# Patient Record
Sex: Female | Born: 1945
Health system: Southern US, Community
[De-identification: ages and names within clinical notes are randomized; demographics above are authoritative.]

## PROBLEM LIST (undated history)

## (undated) DIAGNOSIS — M549 Dorsalgia, unspecified: Secondary | ICD-10-CM

## (undated) DIAGNOSIS — G2581 Restless legs syndrome: Secondary | ICD-10-CM

## (undated) DIAGNOSIS — T7840XA Allergy, unspecified, initial encounter: Secondary | ICD-10-CM

## (undated) DIAGNOSIS — R39198 Other difficulties with micturition: Secondary | ICD-10-CM

## (undated) DIAGNOSIS — E785 Hyperlipidemia, unspecified: Secondary | ICD-10-CM

## (undated) DIAGNOSIS — K2100 Gastro-esophageal reflux disease with esophagitis, without bleeding: Secondary | ICD-10-CM

## (undated) DIAGNOSIS — N6019 Diffuse cystic mastopathy of unspecified breast: Secondary | ICD-10-CM

## (undated) DIAGNOSIS — Z780 Asymptomatic menopausal state: Secondary | ICD-10-CM

## (undated) DIAGNOSIS — K219 Gastro-esophageal reflux disease without esophagitis: Secondary | ICD-10-CM

## (undated) DIAGNOSIS — C4491 Basal cell carcinoma of skin, unspecified: Secondary | ICD-10-CM

## (undated) HISTORY — PX: CATARACT EXTRACTION: SUR2

## (undated) HISTORY — DX: Hyperlipidemia, unspecified: E78.5

## (undated) HISTORY — PX: EYE SURGERY: SHX253

## (undated) HISTORY — DX: Allergy, unspecified, initial encounter: T78.40XA

## (undated) HISTORY — DX: Gastro-esophageal reflux disease with esophagitis, without bleeding: K21.00

## (undated) HISTORY — PX: TOTAL ABDOMINAL HYSTERECTOMY: SHX209

## (undated) HISTORY — DX: Gastro-esophageal reflux disease with esophagitis: K21.0

## (undated) HISTORY — PX: OTHER SURGICAL HISTORY: SHX169

## (undated) HISTORY — DX: Diffuse cystic mastopathy of unspecified breast: N60.19

## (undated) HISTORY — DX: Asymptomatic menopausal state: Z78.0

## (undated) HISTORY — PX: MOUTH SURGERY: SHX715

## (undated) HISTORY — PX: BREAST EXCISIONAL BIOPSY: SUR124

## (undated) HISTORY — DX: Restless legs syndrome: G25.81

## (undated) HISTORY — PX: POLYPECTOMY: SHX149

## (undated) HISTORY — DX: Basal cell carcinoma of skin, unspecified: C44.91

## (undated) HISTORY — PX: BREAST CYST ASPIRATION: SHX578

---

## 2004-04-09 ENCOUNTER — Ambulatory Visit: Payer: Self-pay | Admitting: Family Medicine

## 2004-06-22 ENCOUNTER — Ambulatory Visit: Payer: Self-pay | Admitting: Gastroenterology

## 2005-06-10 ENCOUNTER — Ambulatory Visit: Payer: Self-pay | Admitting: Family Medicine

## 2006-07-19 ENCOUNTER — Ambulatory Visit: Payer: Self-pay | Admitting: Family Medicine

## 2007-02-01 ENCOUNTER — Ambulatory Visit: Payer: Self-pay | Admitting: Physician Assistant

## 2007-02-15 ENCOUNTER — Ambulatory Visit: Payer: Self-pay | Admitting: Physician Assistant

## 2007-08-17 ENCOUNTER — Ambulatory Visit: Payer: Self-pay | Admitting: Family Medicine

## 2007-10-10 DIAGNOSIS — E559 Vitamin D deficiency, unspecified: Secondary | ICD-10-CM | POA: Insufficient documentation

## 2007-11-06 ENCOUNTER — Ambulatory Visit: Payer: Self-pay | Admitting: Unknown Physician Specialty

## 2008-08-09 ENCOUNTER — Encounter: Payer: Self-pay | Admitting: Family Medicine

## 2008-08-13 ENCOUNTER — Encounter: Payer: Self-pay | Admitting: Family Medicine

## 2008-08-23 ENCOUNTER — Encounter: Payer: Self-pay | Admitting: Cardiovascular Disease

## 2008-08-23 DIAGNOSIS — E785 Hyperlipidemia, unspecified: Secondary | ICD-10-CM | POA: Insufficient documentation

## 2008-08-28 ENCOUNTER — Ambulatory Visit: Payer: Self-pay | Admitting: Family Medicine

## 2008-09-12 ENCOUNTER — Encounter: Payer: Self-pay | Admitting: Family Medicine

## 2008-10-13 ENCOUNTER — Encounter: Payer: Self-pay | Admitting: Family Medicine

## 2008-11-13 ENCOUNTER — Encounter: Payer: Self-pay | Admitting: Family Medicine

## 2009-01-17 DIAGNOSIS — D239 Other benign neoplasm of skin, unspecified: Secondary | ICD-10-CM

## 2009-01-17 HISTORY — DX: Other benign neoplasm of skin, unspecified: D23.9

## 2009-08-18 ENCOUNTER — Encounter: Payer: Self-pay | Admitting: Cardiovascular Disease

## 2009-08-18 LAB — CONVERTED CEMR LAB
ALT: 11 units/L
Albumin: 4.4 g/dL
Alkaline Phosphatase: 59 units/L
CO2: 27 meq/L
Chloride: 101 meq/L
Creatinine, Ser: 0.92 mg/dL
HDL: 90 mg/dL
TSH: 5.42 microintl units/mL
Total Bilirubin: 0.4 mg/dL
Triglyceride fasting, serum: 110 mg/dL

## 2009-08-27 ENCOUNTER — Encounter: Payer: Self-pay | Admitting: Cardiovascular Disease

## 2009-08-29 ENCOUNTER — Encounter: Payer: Self-pay | Admitting: Cardiovascular Disease

## 2009-09-03 ENCOUNTER — Ambulatory Visit: Payer: Self-pay | Admitting: Cardiovascular Disease

## 2009-09-03 DIAGNOSIS — R9431 Abnormal electrocardiogram [ECG] [EKG]: Secondary | ICD-10-CM | POA: Insufficient documentation

## 2009-09-19 ENCOUNTER — Ambulatory Visit: Payer: Self-pay | Admitting: Family Medicine

## 2010-04-14 NOTE — Letter (Signed)
Summary: Historic Patient File  Historic Patient File   Imported By: [place] Carbo 09/04/2009 11:18:08  _____________________________________________________________________  External Attachment:    Type:   Image     Comment:   External Document

## 2010-04-14 NOTE — Assessment & Plan Note (Signed)
Summary: NP6/AMD   Visit Type:  Initial Consult Primary Provider:  [facility]  CC:  abnormal EKG. No cardiac complaints.  History of Present Illness: Ms. Heather Singh is a very pleasant 65 year old woman with no significant past cardiac history, history of GERD, osteoporosis who is active at baseline who presents for an abnormal EKG. She does have hyperlipidemia.  She states that she is active, works in her garden, exercises with a TV exercise program 3 times per week for more than 20 minutes at a time and does not have any symptoms of shortness of breath or chest pain. No significant leg edema, no lightheadedness, dizziness, chest tightness or neck tightness.  EKG from Dr. Carlynn Purl office on August 28, 1998 and shows normal sinus rhythm with poor R-wave progression through the precordial leads otherwise no significant ST or T wave changes.  EKG from June 2010 is unchanged from June 2011.  EKG in our office shows normal sinus rhythm with heart rate 59 beats per minute, no significant ST or T wave changes. The R wave progression is normal, T wave inversion has resolved in V2.  Current Medications (verified): 1)  Aspirin 81 Mg Tbec (Aspirin) .... Take One Tablet By Mouth Daily 2)  [facility] Folic Acid 800 Mcg Tabs (Folic Acid) .... Once Daily 3)  Synthroid 25 Mcg Tabs (Levothyroxine Sodium) .... 1/2 Once Daily 4)  Premarin 0.45 Mg Tabs (Estrogens Conjugated) .... Once Daily 5)  Pantoprazole Sodium 40 Mg Tbec (Pantoprazole Sodium) .... Once Daily  Allergies (verified): 1)  ! * Azithromycin 2)  ! Levaquin 3)  ! Macrobid 4)  ! Macrodantin 5)  ! Pcn 6)  ! Septra 7)  ! Septra  Past History:  Past Medical History: Last updated: 08/29/2009 reflux esophagitis polyectomy  menopause  RLS fibrocystic Breast disease osteoporosis dyslipidemia  Past Surgical History: Last updated: 08/29/2009 sling blade procedure left eye oral surgery Abdominal Hysterectomy-Total Cataract Extraction  Family  History: Last updated: 08/29/2009 CHF Family History of Coronary Artery Disease:  Family History of CVA or Stroke:  Family History of Hyperlipidemia:  Family History of Thyroid Disease:  Family History of Hypertension:   Social History: Last updated: 08/29/2009 Married  Tobacco Use - No.  Alcohol Use - no Regular Exercise - yes Drug Use - no  Risk Factors: Exercise: yes (08/29/2009)  Risk Factors: Smoking Status: never (08/29/2009)  Review of Systems  The patient denies fever, weight loss, weight gain, vision loss, decreased hearing, hoarseness, chest pain, syncope, dyspnea on exertion, peripheral edema, prolonged cough, abdominal pain, incontinence, muscle weakness, depression, and enlarged lymph nodes.    Vital Signs:  Patient profile:   65 year old female Height:      62 inches Weight:      106 pounds BMI:     19.46 Pulse rate:   59 / minute BP sitting:   120 / 80  (left arm) Cuff size:   regular  Vitals Entered By: Bishop Dublin, CMA (September 03, 2009 10:40 AM)  Physical Exam  General:  Well developed, well nourished, in no acute distress. Head:  normocephalic and atraumatic Neck:  Neck supple, no JVD. No masses, thyromegaly or abnormal cervical nodes. Lungs:  Clear bilaterally to auscultation and percussion. Heart:  Non-displaced PMI, chest non-tender; regular rate and rhythm, S1, S2 without murmurs, rubs or gallops. Carotid upstroke normal, no bruit.  Pedals normal pulses. No edema, no varicosities. Abdomen:  Bowel sounds positive; abdomen soft and non-tender without masses Msk:  Back normal, normal gait. Muscle  strength and tone normal. Pulses:  pulses normal in all 4 extremities Extremities:  No clubbing or cyanosis. Neurologic:  Alert and oriented x 3. Skin:  Intact without lesions or rashes. Psych:  Normal affect.   Impression & Recommendations:  Problem # 1:  ABNORMAL EKG (ICD-794.31) borderline changes noticed on her EKG from Dr. Carlynn Purl office with  poor progression through the anterior precordial leads, borderline T wave abnormality in V2.  These changes have resolved on her EKG here in our office and I wonder if this may be secondary to lead placement.  She has no symptoms concerning for angina. No shortness of breath with exertion. I suggest we follow her for now and if she stays asymptomatic, continues to do her exercises, no additional workup may be needed.  Her updated medication list for this problem includes:    Aspirin 81 Mg Tbec (Aspirin) .Marland Kitchen... Take one tablet by mouth daily  Problem # 2:  HYPERLIPIDEMIA-MIXED (ICD-272.4) We did talk to her about her high cholesterol. She reports her total cholesterol was  240. We'll try to obtain these records. her HDL was reportedly very elevated. She does have a strong family history as her father had a bypass surgery at age 67 and he smoked only in his early years. I will evaluate her carotid ultrasound and see if she has the start of some plaquing though she reports the study looked good. I did mention that if she does have plaquing, we would probably recommend starting low-dose generic simvastatin or Lipitor when it comes generic. Ideally her cholesterol should be less than 200.

## 2010-09-21 ENCOUNTER — Encounter: Payer: Self-pay | Admitting: Cardiovascular Disease

## 2010-10-02 ENCOUNTER — Ambulatory Visit: Payer: Self-pay | Admitting: Family Medicine

## 2011-10-08 ENCOUNTER — Ambulatory Visit: Payer: Self-pay | Admitting: Family Medicine

## 2012-02-28 ENCOUNTER — Ambulatory Visit: Payer: Self-pay

## 2012-11-01 ENCOUNTER — Ambulatory Visit: Payer: Self-pay | Admitting: Family Medicine

## 2013-03-15 LAB — HM COLONOSCOPY: HM Colonoscopy: NORMAL

## 2013-12-19 ENCOUNTER — Ambulatory Visit: Payer: Self-pay | Admitting: Family Medicine

## 2013-12-29 LAB — HM MAMMOGRAPHY: HM MAMMO: NORMAL

## 2014-03-21 ENCOUNTER — Ambulatory Visit: Payer: Self-pay | Admitting: Family Medicine

## 2014-03-21 LAB — HM DEXA SCAN

## 2014-04-23 LAB — LIPID PANEL
CHOLESTEROL: 185 mg/dL (ref 0–200)
HDL: 84 mg/dL — AB (ref 35–70)
LDL CALC: 90 mg/dL
TRIGLYCERIDES: 56 mg/dL (ref 40–160)

## 2014-10-19 ENCOUNTER — Encounter: Payer: Self-pay | Admitting: Family Medicine

## 2014-10-19 DIAGNOSIS — K219 Gastro-esophageal reflux disease without esophagitis: Secondary | ICD-10-CM | POA: Insufficient documentation

## 2014-10-19 DIAGNOSIS — N6019 Diffuse cystic mastopathy of unspecified breast: Secondary | ICD-10-CM | POA: Insufficient documentation

## 2014-10-19 DIAGNOSIS — M810 Age-related osteoporosis without current pathological fracture: Secondary | ICD-10-CM | POA: Insufficient documentation

## 2014-10-19 DIAGNOSIS — G2581 Restless legs syndrome: Secondary | ICD-10-CM | POA: Insufficient documentation

## 2014-10-19 DIAGNOSIS — E538 Deficiency of other specified B group vitamins: Secondary | ICD-10-CM | POA: Insufficient documentation

## 2014-10-19 DIAGNOSIS — E039 Hypothyroidism, unspecified: Secondary | ICD-10-CM | POA: Insufficient documentation

## 2014-10-19 DIAGNOSIS — K449 Diaphragmatic hernia without obstruction or gangrene: Secondary | ICD-10-CM | POA: Insufficient documentation

## 2014-10-19 DIAGNOSIS — J3089 Other allergic rhinitis: Secondary | ICD-10-CM | POA: Insufficient documentation

## 2014-10-19 DIAGNOSIS — E2839 Other primary ovarian failure: Secondary | ICD-10-CM | POA: Insufficient documentation

## 2014-10-21 ENCOUNTER — Encounter: Payer: Self-pay | Admitting: Family Medicine

## 2014-10-21 ENCOUNTER — Telehealth: Payer: Self-pay | Admitting: Family Medicine

## 2014-10-21 ENCOUNTER — Ambulatory Visit (INDEPENDENT_AMBULATORY_CARE_PROVIDER_SITE_OTHER): Payer: Federal, State, Local not specified - PPO | Admitting: Family Medicine

## 2014-10-21 VITALS — BP 108/62 | HR 78 | Temp 97.5°F | Resp 16 | Ht 63.0 in | Wt 111.4 lb

## 2014-10-21 DIAGNOSIS — J3089 Other allergic rhinitis: Secondary | ICD-10-CM

## 2014-10-21 DIAGNOSIS — E039 Hypothyroidism, unspecified: Secondary | ICD-10-CM

## 2014-10-21 DIAGNOSIS — K5909 Other constipation: Secondary | ICD-10-CM | POA: Diagnosis not present

## 2014-10-21 DIAGNOSIS — J309 Allergic rhinitis, unspecified: Secondary | ICD-10-CM

## 2014-10-21 DIAGNOSIS — Z23 Encounter for immunization: Secondary | ICD-10-CM

## 2014-10-21 DIAGNOSIS — E785 Hyperlipidemia, unspecified: Secondary | ICD-10-CM | POA: Diagnosis not present

## 2014-10-21 DIAGNOSIS — M810 Age-related osteoporosis without current pathological fracture: Secondary | ICD-10-CM | POA: Diagnosis not present

## 2014-10-21 DIAGNOSIS — R600 Localized edema: Secondary | ICD-10-CM | POA: Diagnosis not present

## 2014-10-21 MED ORDER — LEVOTHYROXINE SODIUM 25 MCG PO TABS: 25.0000 ug | ORAL_TABLET | Freq: Every day | ORAL | Status: DC

## 2014-10-21 MED ORDER — PRAVASTATIN SODIUM 20 MG PO TABS: 20.0000 mg | ORAL_TABLET | Freq: Every day | ORAL | Status: DC

## 2014-10-21 MED ORDER — FLUTICASONE PROPIONATE 50 MCG/ACT NA SUSP: 2.0000 | Freq: Every day | NASAL | Status: DC

## 2014-10-21 NOTE — Progress Notes (Signed)
Name: Heather Singh   MRN: [id number]    DOB: 05/31/1945   Date:10/21/2014       Progress Note  Subjective  Chief Complaint  Chief Complaint  Patient presents with  . Hypothyroidism  . Hyperlipidemia  . Fatigue    onset 2 weeks ago, states she has had a couple of episodes where she felt really weak no energy and took bp and it was low 90's/50's (improving)    HPI  Hypothyroidism: she has been doing well, had an episode of fatigue for two days about two weeks ago and bp was low, also has noticed some constipation since she started taking calcium supplementation daily . No dysphagia, or hair loss  Constipation: she has noticed hard stools since she started taking Calcium daily a few months ago.   Bilateral lower extremity edema: She had an episode of leg swelling associated with low bp and fatigue a couple weeks ago but has resolved, she denies associated SOB, cough or chest pain at the time.   Perennial Allergic Rhinitis: using nasal spray and taking loratadine and symptoms are under control at this time.  Patient Active Problem List   Diagnosis Date Noted  . Other constipation 10/21/2014  . Acquired hypothyroidism 10/19/2014  . Perennial allergic rhinitis 10/19/2014  . B12 deficiency 10/19/2014  . Fibrocystic breast disease 10/19/2014  . Gastro-esophageal reflux disease without esophagitis 10/19/2014  . OP (osteoporosis) 10/19/2014  . Ovarian failure 10/19/2014  . Hiatal hernia 10/19/2014  . Restless legs syndrome 10/19/2014  . ABNORMAL EKG 09/03/2009  . Dyslipidemia 08/23/2008  . Vitamin D deficiency 10/10/2007    Past Surgical History  Procedure Laterality Date  . Sling blade procedure    . Polypectomy    . Eye surgery      left  . Mouth surgery    . Total abdominal hysterectomy    . Cataract extraction      Family History  Problem Relation Age of Onset  . Heart failure Other   . Coronary artery disease Other   . Stroke Other   . Hyperlipidemia Other   .  Hypertension Other   . Thyroid disease Other   . Transient ischemic attack Mother   . Atrial fibrillation Mother   . Arthritis Mother     OA  . Heart disease Father     CHF  . CAD Father   . Hyperlipidemia Sister   . Arthritis Sister     OA  . Drug abuse Son   . Hypertension Son   . Stroke Sister   . Arthritis Sister     OA    History   Social History  . Marital Status: Married    Spouse Name: N/A  . Number of Children: N/A  . Years of Education: N/A   Occupational History  . Not on file.   Social History Main Topics  . Smoking status: Never Smoker   . Smokeless tobacco: Never Used  . Alcohol Use: No  . Drug Use: No  . Sexual Activity: Yes   Other Topics Concern  . Not on file   Social History Narrative   Married   Gets regular exercise     Current outpatient prescriptions:  .  aspirin 81 MG tablet, Take 81 mg by mouth daily.  , Disp: , Rfl:  .  Calcium Citrate-Vitamin D (CALCIUM CITRATE + D) 250-200 MG-UNIT TABS, Take 1 tablet by mouth daily., Disp: , Rfl:  .  fluticasone (FLONASE) 50 MCG/ACT nasal  spray, Place 2 sprays into both nostrils daily., Disp: 16 g, Rfl: 5 .  levothyroxine (SYNTHROID, LEVOTHROID) 25 MCG tablet, Take 1 tablet (25 mcg total) by mouth daily., Disp: 30 tablet, Rfl: 5 .  loratadine (CLARITIN) 10 MG tablet, Take 1 tablet by mouth daily., Disp: , Rfl:  .  pravastatin (PRAVACHOL) 20 MG tablet, Take 1 tablet (20 mg total) by mouth daily., Disp: 30 tablet, Rfl: 5 .  vitamin B-12 (CYANOCOBALAMIN) 1000 MCG tablet, Take 1,000 mcg by mouth daily., Disp: , Rfl:  .  vitamin E 1000 UNIT capsule, Take 1,000 Units by mouth daily., Disp: , Rfl:   Allergies  Allergen Reactions  . Alendronate     dypspepsia  . Azithromycin   . Levofloxacin   . Nitrofurantoin   . Nitrofurantoin Monohyd Macro   . Penicillins   . Sulfamethoxazole-Trimethoprim   . Etodolac Rash     ROS  Constitutional: Negative for fever or weight change.  Respiratory:  Negative for cough and shortness of breath.   Cardiovascular: Negative for chest pain or palpitations.  Gastrointestinal: Negative for abdominal pain, positive  bowel changes.  Musculoskeletal: Negative for gait problem or joint swelling.  Skin: Negative for rash.  Neurological: Negative for dizziness or headache.  No other specific complaints in a complete review of systems (except as listed in HPI above).  Objective  Filed Vitals:   10/21/14 0934  BP: 108/62  Pulse: 78  Temp: 97.5 F (36.4 C)  TempSrc: Oral  Resp: 16  Height: 5\' 3"  (1.6 m)  Weight: 111 lb 6.4 oz (50.531 kg)  SpO2: 98%    Body mass index is 19.74 kg/(m^2).  Physical Exam  Constitutional: Patient appears well-developed and well-nourished.  No distress.  HEENT: head atraumatic, normocephalic, s/p cataract surgery ,  neck supple, no thyromegaly, throat within normal limits Cardiovascular: Normal rate, regular rhythm and normal heart sounds.  No murmur heard. No BLE edema. Pulmonary/Chest: Effort normal and breath sounds normal. No respiratory distress. Abdominal: Soft.  There is no tenderness. Psychiatric: Patient has a normal mood and affect. behavior is normal. Judgment and thought content normal.   PHQ2/9: Depression screen PHQ 2/9 10/21/2014  Decreased Interest 0  Down, Depressed, Hopeless 0  PHQ - 2 Score 0     Fall Risk: Fall Risk  10/21/2014  Falls in the past year? No      Assessment & Plan  1. Bilateral edema of lower extremity Resolved, associated with hypotension, advised to take Gatorade prn if needed  2. OP (osteoporosis) We will contact Dr. Jefm Bryant, patient states they were supposed to get Prolia approved, but she never a call back  3. Dyslipidemia  - pravastatin (PRAVACHOL) 20 MG tablet; Take 1 tablet (20 mg total) by mouth daily.  Dispense: 30 tablet; Refill: 5  4. Acquired hypothyroidism  - levothyroxine (SYNTHROID, LEVOTHROID) 25 MCG tablet; Take 1 tablet (25 mcg total) by  mouth daily.  Dispense: 30 tablet; Refill: 5  5. Perennial allergic rhinitis  - fluticasone (FLONASE) 50 MCG/ACT nasal spray; Place 2 sprays into both nostrils daily.  Dispense: 16 g; Refill: 5  6. Other constipation  Advised prune juice, increase water intake   7. Need for pneumococcal vaccination  - Pneumococcal conjugate vaccine 13-valent

## 2014-10-21 NOTE — Telephone Encounter (Signed)
She was seen today and told me Dr. Jefm Bryant was supposed to contact her about when she was going to start Prolia but she has been unable to get a hold of him since March .Please find out if medication was approved and when she can start osteoporosis therapy Thank you

## 2014-10-21 NOTE — Telephone Encounter (Signed)
When I called they stated.  Patient was suppost to contact them back about which medication she wanted to start prolia or reclast?  I told them to go ahead and run for prolia

## 2014-11-13 ENCOUNTER — Encounter: Payer: Self-pay | Admitting: Family Medicine

## 2014-11-13 ENCOUNTER — Ambulatory Visit (INDEPENDENT_AMBULATORY_CARE_PROVIDER_SITE_OTHER): Payer: Federal, State, Local not specified - PPO | Admitting: Family Medicine

## 2014-11-13 VITALS — BP 124/64 | HR 78 | Temp 98.0°F | Resp 16 | Ht 62.0 in | Wt 113.0 lb

## 2014-11-13 DIAGNOSIS — Z23 Encounter for immunization: Secondary | ICD-10-CM | POA: Diagnosis not present

## 2014-11-13 DIAGNOSIS — K219 Gastro-esophageal reflux disease without esophagitis: Secondary | ICD-10-CM

## 2014-11-13 DIAGNOSIS — K5909 Other constipation: Secondary | ICD-10-CM | POA: Diagnosis not present

## 2014-11-13 MED ORDER — RANITIDINE HCL 150 MG PO CAPS: 150.0000 mg | ORAL_CAPSULE | Freq: Two times a day (BID) | ORAL | Status: DC

## 2014-11-13 MED ORDER — POLYETHYLENE GLYCOL 3350 17 GM/SCOOP PO POWD: 17.0000 g | Freq: Every day | ORAL | Status: DC

## 2014-11-13 NOTE — Progress Notes (Signed)
Name: Heather Singh   MRN: [id number]    DOB: 1945/11/23   Date:11/13/2014       Progress Note  Subjective  Chief Complaint  Chief Complaint  Patient presents with  . Gastrophageal Reflux    Constant-Onset-1 week, worsen, burning, acid coming into mouth. Patient states she was on medication before and went off and was doing good for a while and now it has came back.   . Constipation    Constant- Onset-1 month, unchanged, patient is going every 2-3 days with not much bowel movement-hard balls, painful. Patient has not tried anything otc but was told to come back in and discuss with Dr. Ancil Boozer if problem was consistant     HPI  Constipation: new onset - started on Calcium daily in March 2016 and constipation has gotten progressively worse since. Severe over the past month.  She has a Bristol score of 1 -2 . Bowel movements about every three days now. She is up to date with colonoscopy. Done 03/2013 and had two polyps - repeat in 5 years.  Mild decrease in appetite over the past couple of weeks. Feeling bloated, no blood in stools. She has to strain a little. Also has noticed increase in urinary frequency. No dysuria.   GERD: also has a history of mild hiatal hernia. She was doing well, off medication for a long time but over the past week she has noticed regurgitation, substernal burning that is constant. She had EGD done by Dr. Tiffany Kocher in 2009.  Patient Active Problem List   Diagnosis Date Noted  . Other constipation 10/21/2014  . Acquired hypothyroidism 10/19/2014  . Perennial allergic rhinitis 10/19/2014  . B12 deficiency 10/19/2014  . Fibrocystic breast disease 10/19/2014  . Gastro-esophageal reflux disease without esophagitis 10/19/2014  . OP (osteoporosis) 10/19/2014  . Ovarian failure 10/19/2014  . Hiatal hernia 10/19/2014  . Restless legs syndrome 10/19/2014  . ABNORMAL EKG 09/03/2009  . Dyslipidemia 08/23/2008  . Vitamin D deficiency 10/10/2007    Past Surgical History   Procedure Laterality Date  . Sling blade procedure    . Polypectomy    . Eye surgery      left  . Mouth surgery    . Total abdominal hysterectomy    . Cataract extraction      Family History  Problem Relation Age of Onset  . Heart failure Other   . Coronary artery disease Other   . Stroke Other   . Hyperlipidemia Other   . Hypertension Other   . Thyroid disease Other   . Transient ischemic attack Mother   . Atrial fibrillation Mother   . Arthritis Mother     OA  . Heart disease Father     CHF  . CAD Father   . Hyperlipidemia Sister   . Arthritis Sister     OA  . Drug abuse Son   . Hypertension Son   . Stroke Sister   . Arthritis Sister     OA    Social History   Social History  . Marital Status: Married    Spouse Name: N/A  . Number of Children: N/A  . Years of Education: N/A   Occupational History  . Not on file.   Social History Main Topics  . Smoking status: Never Smoker   . Smokeless tobacco: Never Used  . Alcohol Use: No  . Drug Use: No  . Sexual Activity:    Partners: Male   Other Topics Concern  .  Not on file   Social History Narrative   Married   Gets regular exercise     Current outpatient prescriptions:  .  aspirin 81 MG tablet, Take 81 mg by mouth daily.  , Disp: , Rfl:  .  Calcium Citrate-Vitamin D (CALCIUM CITRATE + D) 250-200 MG-UNIT TABS, Take 1 tablet by mouth daily., Disp: , Rfl:  .  fluticasone (FLONASE) 50 MCG/ACT nasal spray, Place 2 sprays into both nostrils daily., Disp: 16 g, Rfl: 5 .  levothyroxine (SYNTHROID, LEVOTHROID) 25 MCG tablet, Take 1 tablet (25 mcg total) by mouth daily., Disp: 30 tablet, Rfl: 5 .  loratadine (CLARITIN) 10 MG tablet, Take 1 tablet by mouth daily., Disp: , Rfl:  .  pravastatin (PRAVACHOL) 20 MG tablet, Take 1 tablet (20 mg total) by mouth daily., Disp: 30 tablet, Rfl: 5 .  vitamin B-12 (CYANOCOBALAMIN) 1000 MCG tablet, Take 1,000 mcg by mouth daily., Disp: , Rfl:  .  vitamin E 1000 UNIT capsule,  Take 1,000 Units by mouth daily., Disp: , Rfl:   Allergies  Allergen Reactions  . Alendronate     dypspepsia  . Azithromycin   . Levofloxacin   . Nitrofurantoin   . Nitrofurantoin Monohyd Macro   . Penicillins   . Sulfamethoxazole-Trimethoprim   . Etodolac Rash     ROS  Constitutional: Negative for fever or significant weight change.  Respiratory: Mild   cough no  shortness of breath.   Cardiovascular: Negative for chest pain or palpitations.  Gastrointestinal: Negative for abdominal pain, positive for  bowel changes.  Musculoskeletal: Negative for gait problem or joint swelling.  Skin: Negative for rash.  Neurological: Negative for dizziness or headache.  No other specific complaints in a complete review of systems (except as listed in HPI above).  Objective  Filed Vitals:   11/13/14 1014  BP: 124/64  Pulse: 78  Temp: 98 F (36.7 C)  TempSrc: Oral  Resp: 16  Height: 5\' 2"  (1.575 m)  Weight: 113 lb (51.256 kg)  SpO2: 98%    Body mass index is 20.66 kg/(m^2).  Physical Exam  Constitutional: Patient appears well-developed and well-nourished.  No distress.  HEENT: head atraumatic, normocephalic, pupils equal and reactive to light,  neck supple, throat within normal limits Cardiovascular: Normal rate, regular rhythm and normal heart sounds.  No murmur heard. No BLE edema. Pulmonary/Chest: Effort normal and breath sounds normal. No respiratory distress. Abdominal: Soft.  There is no tenderness. No masses, rectal exam, some stools present, but not impacted, no hemorrhoids, hemo- occult negative Psychiatric: Patient has a normal mood and affect. behavior is normal. Judgment and thought content normal.   PHQ2/9: Depression screen PHQ 2/9 10/21/2014  Decreased Interest 0  Down, Depressed, Hopeless 0  PHQ - 2 Score 0     Fall Risk: Fall Risk  10/21/2014  Falls in the past year? No      Assessment & Plan  1. Other constipation New onset, over the past 6  months, up to date with colonoscopy, heme stool negative in the office today , we will try Miralax for two weeks and switch to Linzess if no improvement , may need to hold calcium tablets also until symptoms improves  - TSH - Comprehensive metabolic panel - CBC with Differential/Platelet - polyethylene glycol powder (GLYCOLAX/MIRALAX) powder; Take 17 g by mouth daily.  Dispense: 3350 g; Refill: 0  2. Needs flu shot  - Flu vaccine HIGH DOSE PF (Fluzone High dose)  3. Gastro-esophageal reflux disease without  esophagitis It may have been triggered by constipation, we will try Ranitidine first - ranitidine (ZANTAC) 150 MG capsule; Take 1 capsule (150 mg total) by mouth 2 (two) times daily.  Dispense: 60 capsule; Refill: 0

## 2014-11-13 NOTE — Patient Instructions (Signed)
Start with Miralax and if no improvement after two weeks switch to Linzess

## 2014-11-15 ENCOUNTER — Other Ambulatory Visit: Payer: Self-pay | Admitting: Family Medicine

## 2014-11-15 LAB — TSH: TSH: 2.88 u[IU]/mL (ref 0.450–4.500)

## 2014-11-15 LAB — CBC WITH DIFFERENTIAL/PLATELET
BASOS ABS: 0 10*3/uL (ref 0.0–0.2)
BASOS: 0 %
EOS (ABSOLUTE): 0.1 10*3/uL (ref 0.0–0.4)
Eos: 2 %
Hematocrit: 36.3 % (ref 34.0–46.6)
Hemoglobin: 12.2 g/dL (ref 11.1–15.9)
IMMATURE GRANULOCYTES: 0 %
Immature Grans (Abs): 0 10*3/uL (ref 0.0–0.1)
Lymphocytes Absolute: 1 10*3/uL (ref 0.7–3.1)
Lymphs: 22 %
MCH: 33 pg (ref 26.6–33.0)
MCHC: 33.6 g/dL (ref 31.5–35.7)
MCV: 98 fL — AB (ref 79–97)
MONOS ABS: 0.5 10*3/uL (ref 0.1–0.9)
Monocytes: 11 %
NEUTROS PCT: 65 %
Neutrophils Absolute: 3 10*3/uL (ref 1.4–7.0)
PLATELETS: 214 10*3/uL (ref 150–379)
RBC: 3.7 x10E6/uL — ABNORMAL LOW (ref 3.77–5.28)
RDW: 13.4 % (ref 12.3–15.4)
WBC: 4.7 10*3/uL (ref 3.4–10.8)

## 2014-11-15 LAB — COMPREHENSIVE METABOLIC PANEL
A/G RATIO: 2.2 (ref 1.1–2.5)
ALK PHOS: 67 IU/L (ref 39–117)
ALT: 9 IU/L (ref 0–32)
AST: 16 IU/L (ref 0–40)
Albumin: 4.4 g/dL (ref 3.5–4.8)
BILIRUBIN TOTAL: 0.5 mg/dL (ref 0.0–1.2)
BUN/Creatinine Ratio: 17 (ref 11–26)
BUN: 12 mg/dL (ref 8–27)
CO2: 26 mmol/L (ref 18–29)
Calcium: 9.6 mg/dL (ref 8.7–10.3)
Chloride: 99 mmol/L (ref 97–108)
Creatinine, Ser: 0.72 mg/dL (ref 0.57–1.00)
GFR calc Af Amer: 97 mL/min/{1.73_m2} (ref >59)
GFR calc non Af Amer: 85 mL/min/{1.73_m2} (ref >59)
GLOBULIN, TOTAL: 2 g/dL (ref 1.5–4.5)
Glucose: 83 mg/dL (ref 65–99)
POTASSIUM: 4.3 mmol/L (ref 3.5–5.2)
Sodium: 140 mmol/L (ref 134–144)
Total Protein: 6.4 g/dL (ref 6.0–8.5)

## 2014-12-13 ENCOUNTER — Ambulatory Visit: Payer: Federal, State, Local not specified - PPO | Admitting: Family Medicine

## 2015-04-24 ENCOUNTER — Ambulatory Visit (INDEPENDENT_AMBULATORY_CARE_PROVIDER_SITE_OTHER): Payer: Federal, State, Local not specified - PPO | Admitting: Family Medicine

## 2015-04-24 ENCOUNTER — Encounter: Payer: Self-pay | Admitting: Family Medicine

## 2015-04-24 VITALS — BP 120/62 | HR 73 | Temp 97.8°F | Resp 14 | Ht 62.0 in | Wt 112.1 lb

## 2015-04-24 DIAGNOSIS — M810 Age-related osteoporosis without current pathological fracture: Secondary | ICD-10-CM

## 2015-04-24 DIAGNOSIS — E559 Vitamin D deficiency, unspecified: Secondary | ICD-10-CM

## 2015-04-24 DIAGNOSIS — E785 Hyperlipidemia, unspecified: Secondary | ICD-10-CM

## 2015-04-24 DIAGNOSIS — E039 Hypothyroidism, unspecified: Secondary | ICD-10-CM | POA: Diagnosis not present

## 2015-04-24 DIAGNOSIS — E538 Deficiency of other specified B group vitamins: Secondary | ICD-10-CM | POA: Diagnosis not present

## 2015-04-24 DIAGNOSIS — K5909 Other constipation: Secondary | ICD-10-CM | POA: Diagnosis not present

## 2015-04-24 DIAGNOSIS — R5383 Other fatigue: Secondary | ICD-10-CM | POA: Diagnosis not present

## 2015-04-24 MED ORDER — LEVOTHYROXINE SODIUM 25 MCG PO TABS: 25.0000 ug | ORAL_TABLET | Freq: Every day | ORAL | Status: DC

## 2015-04-24 MED ORDER — PRAVASTATIN SODIUM 20 MG PO TABS: 20.0000 mg | ORAL_TABLET | Freq: Every day | ORAL | Status: DC

## 2015-04-24 NOTE — Progress Notes (Signed)
Name: Heather Singh   MRN: [id number]    DOB: 25-May-1945   Date:04/24/2015       Progress Note  Subjective  Chief Complaint  Chief Complaint  Patient presents with  . Follow-up    patient is here for her 45-month f/u    HPI  Hypothyroidism: she has been very tired lately, taking same dose Synthroid for months. Skin is a little more dry than usual but is expected during the winter time. She denies dysphagia.   Constipation: she had severe constipation with calcium supplementation, but resolved since she stopped supplementation and took Miralax, bowel movements are back to normal now.   Hyperlipidemia: she is taking Pravachol denies side effects, she would like to have labs rechecked. No chest pain or SOB, swelling of legs has improved.   Perennial Allergic Rhinitis: using nasal spray and taking loratadine and symptoms are under control at this time.  Osteoporosis: seen by Dr. Jefm Bryant but Prolia not covered by insurance, taking Vitamin D 2000 units daily and will contact him back.   Right ear pain: she has noticed pain on right side when she is running errands on windy cold days. Advised to wear ear plugs or cotton balls on those days.   Stress: she worries about her sister that had a stroke 3 years ago and is at [facility] since the event.   Patient Active Problem List   Diagnosis Date Noted  . Acquired hypothyroidism 10/19/2014  . Perennial allergic rhinitis 10/19/2014  . B12 deficiency 10/19/2014  . Fibrocystic breast disease 10/19/2014  . Gastro-esophageal reflux disease without esophagitis 10/19/2014  . OP (osteoporosis) 10/19/2014  . Ovarian failure 10/19/2014  . Hiatal hernia 10/19/2014  . Restless legs syndrome 10/19/2014  . ABNORMAL EKG 09/03/2009  . Dyslipidemia 08/23/2008  . Vitamin D deficiency 10/10/2007    Past Surgical History  Procedure Laterality Date  . Sling blade procedure    . Polypectomy    . Eye surgery      left  . Mouth surgery    . Total  abdominal hysterectomy    . Cataract extraction      Family History  Problem Relation Age of Onset  . Heart failure Other   . Coronary artery disease Other   . Stroke Other   . Hyperlipidemia Other   . Hypertension Other   . Thyroid disease Other   . Transient ischemic attack Mother   . Atrial fibrillation Mother   . Arthritis Mother     OA  . Heart disease Father     CHF  . CAD Father   . Hyperlipidemia Sister   . Arthritis Sister     OA  . Drug abuse Son   . Hypertension Son   . Stroke Sister   . Arthritis Sister     OA    Social History   Social History  . Marital Status: Married    Spouse Name: N/A  . Number of Children: N/A  . Years of Education: N/A   Occupational History  . Not on file.   Social History Main Topics  . Smoking status: Never Smoker   . Smokeless tobacco: Never Used  . Alcohol Use: No  . Drug Use: No  . Sexual Activity:    Partners: Male   Other Topics Concern  . Not on file   Social History Narrative   Married   Gets regular exercise     Current outpatient prescriptions:  .  aspirin 81 MG  tablet, Take 81 mg by mouth daily.  , Disp: , Rfl:  .  fluticasone (FLONASE) 50 MCG/ACT nasal spray, Place 2 sprays into both nostrils daily., Disp: 16 g, Rfl: 5 .  levothyroxine (SYNTHROID, LEVOTHROID) 25 MCG tablet, Take 1 tablet (25 mcg total) by mouth daily., Disp: 30 tablet, Rfl: 5 .  loratadine (CLARITIN) 10 MG tablet, Take 1 tablet by mouth daily., Disp: , Rfl:  .  pravastatin (PRAVACHOL) 20 MG tablet, Take 1 tablet (20 mg total) by mouth daily., Disp: 30 tablet, Rfl: 5 .  vitamin B-12 (CYANOCOBALAMIN) 1000 MCG tablet, Take 1,000 mcg by mouth daily., Disp: , Rfl:  .  vitamin E 1000 UNIT capsule, Take 1,000 Units by mouth daily., Disp: , Rfl:   Allergies  Allergen Reactions  . Alendronate     dypspepsia  . Azithromycin   . Levofloxacin   . Nitrofurantoin   . Nitrofurantoin Monohyd Macro   . Penicillins   .  Sulfamethoxazole-Trimethoprim   . Etodolac Rash     ROS  Ten systems reviewed and is negative except as mentioned in HPI   Objective  Filed Vitals:   04/24/15 0905  BP: 120/62  Pulse: 73  Temp: 97.8 F (36.6 C)  TempSrc: Oral  Resp: 14  Height: 5\' 2"  (1.575 m)  Weight: 112 lb 1.6 oz (50.848 kg)  SpO2: 97%    Body mass index is 20.5 kg/(m^2).  Physical Exam  Constitutional: Patient appears well-developed and well-nourished.  No distress.  HEENT: head atraumatic, normocephalic, pupils equal and reactive to light, ears normal TM bilaterally, neck supple, throat within normal limits, normal TMJ bilaterally  Cardiovascular: Normal rate, regular rhythm and normal heart sounds.  No murmur heard. No BLE edema. Pulmonary/Chest: Effort normal and breath sounds normal. No respiratory distress. Abdominal: Soft.  There is no tenderness. Psychiatric: Patient has a normal mood and affect. behavior is normal. Judgment and thought content normal.  PHQ2/9: Depression screen [facility] 2/9 04/24/2015 10/21/2014  Decreased Interest 0 0  Down, Depressed, Hopeless 0 0  PHQ - 2 Score 0 0    Fall Risk: Fall Risk  04/24/2015 10/21/2014  Falls in the past year? No No    Functional Status Survey: Is the patient deaf or have difficulty hearing?: No Does the patient have difficulty seeing, even when wearing glasses/contacts?: No Does the patient have difficulty concentrating, remembering, or making decisions?: No Does the patient have difficulty walking or climbing stairs?: No Does the patient have difficulty dressing or bathing?: No Does the patient have difficulty doing errands alone such as visiting a doctor's office or shopping?: No    Assessment & Plan  1. Acquired hypothyroidism  - levothyroxine (SYNTHROID, LEVOTHROID) 25 MCG tablet; Take 1 tablet (25 mcg total) by mouth daily.  Dispense: 30 tablet; Refill: 5 - TSH  2. OP (osteoporosis)  Advised to contact Dr. Jefm Bryant  3.  Dyslipidemia  - pravastatin (PRAVACHOL) 20 MG tablet; Take 1 tablet (20 mg total) by mouth daily.  Dispense: 30 tablet; Refill: 5 - Lipid panel  4. Other fatigue  - Comprehensive metabolic panel - TSH  5. Other constipation  resolved  6. Vitamin D deficiency  - VITAMIN D 25 Hydroxy (Vit-D Deficiency, Fractures)  7. B12 deficiency  - Vitamin B12

## 2015-04-25 LAB — COMPREHENSIVE METABOLIC PANEL
A/G RATIO: 1.8 (ref 1.1–2.5)
ALT: 7 IU/L (ref 0–32)
AST: 15 IU/L (ref 0–40)
Albumin: 4.4 g/dL (ref 3.5–4.8)
Alkaline Phosphatase: 85 IU/L (ref 39–117)
BUN/Creatinine Ratio: 21 (ref 11–26)
BUN: 14 mg/dL (ref 8–27)
Bilirubin Total: 0.6 mg/dL (ref 0.0–1.2)
CALCIUM: 10.2 mg/dL (ref 8.7–10.3)
CHLORIDE: 98 mmol/L (ref 96–106)
CO2: 25 mmol/L (ref 18–29)
Creatinine, Ser: 0.67 mg/dL (ref 0.57–1.00)
GFR, EST AFRICAN AMERICAN: 102 mL/min/{1.73_m2} (ref >59)
GFR, EST NON AFRICAN AMERICAN: 89 mL/min/{1.73_m2} (ref >59)
Globulin, Total: 2.4 g/dL (ref 1.5–4.5)
Glucose: 89 mg/dL (ref 65–99)
Potassium: 4 mmol/L (ref 3.5–5.2)
Sodium: 141 mmol/L (ref 134–144)
TOTAL PROTEIN: 6.8 g/dL (ref 6.0–8.5)

## 2015-04-25 LAB — LIPID PANEL
CHOL/HDL RATIO: 2.3 ratio (ref 0.0–4.4)
Cholesterol, Total: 203 mg/dL — ABNORMAL HIGH (ref 100–199)
HDL: 87 mg/dL (ref >39)
LDL Calculated: 102 mg/dL — ABNORMAL HIGH (ref 0–99)
TRIGLYCERIDES: 69 mg/dL (ref 0–149)
VLDL Cholesterol Cal: 14 mg/dL (ref 5–40)

## 2015-04-25 LAB — VITAMIN D 25 HYDROXY (VIT D DEFICIENCY, FRACTURES): VIT D 25 HYDROXY: 43.3 ng/mL (ref 30.0–100.0)

## 2015-04-25 LAB — VITAMIN B12: VITAMIN B 12: 1538 pg/mL — AB (ref 211–946)

## 2015-04-25 LAB — TSH: TSH: 2.52 u[IU]/mL (ref 0.450–4.500)

## 2015-06-05 DIAGNOSIS — N952 Postmenopausal atrophic vaginitis: Secondary | ICD-10-CM | POA: Insufficient documentation

## 2015-09-04 ENCOUNTER — Other Ambulatory Visit: Payer: Self-pay | Admitting: Family Medicine

## 2015-09-04 NOTE — Telephone Encounter (Signed)
Patient requesting refill. 

## 2015-10-22 ENCOUNTER — Encounter: Payer: Self-pay | Admitting: Family Medicine

## 2015-10-22 ENCOUNTER — Other Ambulatory Visit: Payer: Self-pay | Admitting: Family Medicine

## 2015-10-22 ENCOUNTER — Ambulatory Visit (INDEPENDENT_AMBULATORY_CARE_PROVIDER_SITE_OTHER): Payer: Federal, State, Local not specified - PPO | Admitting: Family Medicine

## 2015-10-22 VITALS — BP 118/72 | HR 82 | Temp 98.0°F | Resp 16 | Ht 62.0 in | Wt 111.8 lb

## 2015-10-22 DIAGNOSIS — L509 Urticaria, unspecified: Secondary | ICD-10-CM

## 2015-10-22 DIAGNOSIS — E559 Vitamin D deficiency, unspecified: Secondary | ICD-10-CM

## 2015-10-22 DIAGNOSIS — E039 Hypothyroidism, unspecified: Secondary | ICD-10-CM | POA: Diagnosis not present

## 2015-10-22 DIAGNOSIS — K219 Gastro-esophageal reflux disease without esophagitis: Secondary | ICD-10-CM

## 2015-10-22 DIAGNOSIS — E785 Hyperlipidemia, unspecified: Secondary | ICD-10-CM

## 2015-10-22 DIAGNOSIS — E538 Deficiency of other specified B group vitamins: Secondary | ICD-10-CM

## 2015-10-22 DIAGNOSIS — Z1231 Encounter for screening mammogram for malignant neoplasm of breast: Secondary | ICD-10-CM

## 2015-10-22 DIAGNOSIS — R21 Rash and other nonspecific skin eruption: Secondary | ICD-10-CM

## 2015-10-22 DIAGNOSIS — M810 Age-related osteoporosis without current pathological fracture: Secondary | ICD-10-CM | POA: Diagnosis not present

## 2015-10-22 MED ORDER — LEVOTHYROXINE SODIUM 25 MCG PO TABS: 25.0000 ug | ORAL_TABLET | Freq: Every day | ORAL | 5 refills | Status: DC

## 2015-10-22 MED ORDER — HYDROXYZINE HCL 25 MG PO TABS: 25.0000 mg | ORAL_TABLET | Freq: Every day | ORAL | 0 refills | Status: DC

## 2015-10-22 NOTE — Progress Notes (Signed)
Name: Heather Singh   MRN: [id number]    DOB: August 27, 1945   Date:10/22/2015       Progress Note  Subjective  Chief Complaint  Chief Complaint  Patient presents with  . Medication Refill    6 month F/U  . Hypothyroidism    Patient states she will have heat intolerance but does not know if it is from thyroid or hot flashes.   . Gastroesophageal Reflux    Patient was doing well without medications but symptoms reoccured and when back on Ranitidine and started experiencing hives, asked the pharmacist and was told to stop medication. The hives and rashs have gone away since quit taking medication.  . Allergic Rhinitis     Takes for symptom relief prn  . Urticaria    Onset-couple of months, constant hives on her neck and upper arm. Patient states she has been on several rounds of Prednisone and by the time she ends it the rash comes back. The rash they are red and itchy and she has also been taking Benadryl for symptom relief.     HPI  Hypothyroidism: she is feeling better, she denies dysphagia. She is compliant with medication  Hyperlipidemia: she is taking Pravachol .No chest pain or SOB, swelling of legs has improved.   Perennial Allergic Rhinitis: using nasal spray and taking loratadine and symptoms are under control at this time.  Osteoporosis: seen by Dr. Jefm Bryant but Prolia not covered by insurance, taking Vitamin D 2000 units daily, she called back to start on medication, but not covered by her insurance.   Stress: she worries about her sister that had a stroke 3 years ago and she was at  [facility] since the event, but switched to [place] April 2017 and seems to be happier with the new environment.   Hive/Rash: she states she developed an acute neck rash 2 months ago, very itchy, she went to Urgent Care and was given prednisone taper and was doing well, but broke out again on day 5. She went back for a prednisone shot that resolved symptoms for a few weeks, but it  recurred, therefore she went to the allergies and only positive allergy was tree, but Dr. Donneta Romberg did not think it was the trigger. He gave her Xyzal but did not work and she is back on Loratadine. Only new medication may have been Ranitidine and she stopped taking it 2 days ago, but still no improvement of symptoms. She has also hired an Conservation officer, nature but the house of bug free. Husband does not have any symptoms. The rash is localized on neck, anterior chest upper back and upper arms. Currently taking Benadryl multiple times a day    Patient Active Problem List   Diagnosis Date Noted  . Atrophic vaginitis 06/05/2015  . Acquired hypothyroidism 10/19/2014  . Perennial allergic rhinitis 10/19/2014  . B12 deficiency 10/19/2014  . Fibrocystic breast disease 10/19/2014  . Gastro-esophageal reflux disease without esophagitis 10/19/2014  . OP (osteoporosis) 10/19/2014  . Ovarian failure 10/19/2014  . Hiatal hernia 10/19/2014  . Restless legs syndrome 10/19/2014  . ABNORMAL EKG 09/03/2009  . Dyslipidemia 08/23/2008  . Vitamin D deficiency 10/10/2007    Past Surgical History:  Procedure Laterality Date  . CATARACT EXTRACTION    . EYE SURGERY     left  . MOUTH SURGERY    . POLYPECTOMY    . SLING BLADE PROCEDURE    . TOTAL ABDOMINAL HYSTERECTOMY      Family History  Problem  Relation Age of Onset  . Heart failure Other   . Coronary artery disease Other   . Stroke Other   . Hyperlipidemia Other   . Hypertension Other   . Thyroid disease Other   . Transient ischemic attack Mother   . Atrial fibrillation Mother   . Arthritis Mother     OA  . Heart disease Father     CHF  . CAD Father   . Hyperlipidemia Sister   . Arthritis Sister     OA  . Drug abuse Son   . Hypertension Son   . Stroke Sister   . Arthritis Sister     OA    Social History   Social History  . Marital status: Married    Spouse name: N/A  . Number of children: N/A  . Years of education: N/A   Occupational  History  . Not on file.   Social History Main Topics  . Smoking status: Never Smoker  . Smokeless tobacco: Never Used  . Alcohol use No  . Drug use: No  . Sexual activity: Yes    Partners: Male   Other Topics Concern  . Not on file   Social History Narrative   Married   Gets regular exercise     Current Outpatient Prescriptions:  .  ALPRAZolam (XANAX) 0.25 MG tablet, Twice daily as needed, Disp: , Rfl:  .  aspirin 81 MG tablet, Take 81 mg by mouth daily.  , Disp: , Rfl:  .  fluticasone (FLONASE) 50 MCG/ACT nasal spray, Place 2 sprays into both nostrils daily., Disp: 16 g, Rfl: 5 .  hydrOXYzine (ATARAX/VISTARIL) 25 MG tablet, Take 1 tablet (25 mg total) by mouth at bedtime., Disp: 30 tablet, Rfl: 0 .  levothyroxine (SYNTHROID, LEVOTHROID) 25 MCG tablet, Take 1 tablet (25 mcg total) by mouth daily., Disp: 30 tablet, Rfl: 5 .  loratadine (CLARITIN) 10 MG tablet, Take 1 tablet by mouth daily., Disp: , Rfl:  .  pravastatin (PRAVACHOL) 20 MG tablet, Take 1 tablet (20 mg total) by mouth daily., Disp: 30 tablet, Rfl: 5 .  ranitidine (ZANTAC) 150 MG tablet, TAKE 1 TABLET(150 MG) BY MOUTH TWICE DAILY, Disp: 60 tablet, Rfl: 2 .  triamcinolone cream (KENALOG) 0.1 %, Apply topically., Disp: , Rfl:  .  vitamin B-12 (CYANOCOBALAMIN) 1000 MCG tablet, Take 1,000 mcg by mouth daily., Disp: , Rfl:  .  vitamin E 1000 UNIT capsule, Take 1,000 Units by mouth daily., Disp: , Rfl:   Allergies  Allergen Reactions  . Alendronate     dypspepsia  . Azithromycin   . Levofloxacin   . Nitrofurantoin   . Nitrofurantoin Monohyd Macro   . Penicillins   . Sulfamethoxazole-Trimethoprim   . Etodolac Rash     ROS  Constitutional: Negative for fever or weight change.  Respiratory: Negative for cough and shortness of breath.   Cardiovascular: Negative for chest pain or palpitations.  Gastrointestinal: Negative for abdominal pain, no bowel changes.  Musculoskeletal: Negative for gait problem or joint  swelling.  Skin: Positive for rash.  Neurological: Negative for dizziness or headache.  No other specific complaints in a complete review of systems (except as listed in HPI above).  Objective  Vitals:   10/22/15 0904  BP: 118/72  Pulse: 82  Resp: 16  Temp: 98 F (36.7 C)  TempSrc: Oral  SpO2: 97%  Weight: 111 lb 12.8 oz (50.7 kg)  Height: 5\' 2"  (1.575 m)    Body mass index is  20.45 kg/m.  Physical Exam  Constitutional: Patient appears well-developed and well-nourished.  No distress.  HEENT: head atraumatic, normocephalic, pupils equal and reactive to light,  neck supple, throat within normal limits Cardiovascular: Normal rate, regular rhythm and normal heart sounds.  No murmur heard. No BLE edema. Pulmonary/Chest: Effort normal and breath sounds normal. No respiratory distress. Abdominal: Soft.  There is no tenderness. Psychiatric: Patient has a normal mood and affect. behavior is normal. Judgment and thought content normal. Skin: some excoriations on posterior neck , also some papules on anterior neck with erythema.   PHQ2/9: Depression screen [facility] 2/9 10/22/2015 04/24/2015 10/21/2014  Decreased Interest 0 0 0  Down, Depressed, Hopeless 0 0 0  PHQ - 2 Score 0 0 0    Fall Risk: Fall Risk  10/22/2015 04/24/2015 10/21/2014  Falls in the past year? No No No    Functional Status Survey: Is the patient deaf or have difficulty hearing?: No Does the patient have difficulty seeing, even when wearing glasses/contacts?: No Does the patient have difficulty concentrating, remembering, or making decisions?: No Does the patient have difficulty walking or climbing stairs?: No Does the patient have difficulty dressing or bathing?: No Does the patient have difficulty doing errands alone such as visiting a doctor's office or shopping?: No   Assessment & Plan  1. Acquired hypothyroidism  -TSH - levothyroxine (SYNTHROID, LEVOTHROID) 25 MCG tablet; Take 1 tablet (25 mcg total) by mouth  daily.  Dispense: 30 tablet; Refill: 5  2. OP (osteoporosis)  She will contact insurance to see if Prolia can be approved  3. Dyslipidemia  On Pravachol but we will try stopping all medications to see if it will improve hive  4. Vitamin D deficiency  Normal in Feb  5. B12 deficiency  Elevated in Feb  6. Gastro-esophageal reflux disease without esophagitis  Off Ranitidine and doing well at this time  7. Rash of neck  - hydrOXYzine (ATARAX/VISTARIL) 25 MG tablet; Take 1 tablet (25 mg total) by mouth at bedtime.  Dispense: 30 tablet; Refill: 0 - CBC with Differential/Platelet - Sedimentation rate - TSH - COMPLETE METABOLIC PANEL WITH GFR - C-reactive protein - Ambulatory referral to Dermatology  8. Hives  Unknown etiology. Seen by Allergist, resume atarax at night, advised to only take Synthroid, loratadine and Atarax for the next week and see if hives will resolve, if it does, resume one medication/or supplement per week until she can identify if it is a medication causing it. We will also refer her to a dermatologist to consider biopsy - hydrOXYzine (ATARAX/VISTARIL) 25 MG tablet; Take 1 tablet (25 mg total) by mouth at bedtime.  Dispense: 30 tablet; Refill: 0 - CBC with Differential/Platelet - Sedimentation rate - TSH - COMPLETE METABOLIC PANEL WITH GFR - C-reactive protein - Ambulatory referral to Dermatology  9. Encounter for screening mammogram for breast cancer  - MM Digital Screening; Future

## 2015-10-23 ENCOUNTER — Other Ambulatory Visit: Payer: Self-pay | Admitting: Family Medicine

## 2015-10-23 DIAGNOSIS — E039 Hypothyroidism, unspecified: Secondary | ICD-10-CM

## 2015-10-23 LAB — CBC WITH DIFFERENTIAL/PLATELET
BASOS: 0 %
Basophils Absolute: 0 10*3/uL (ref 0.0–0.2)
EOS (ABSOLUTE): 0.2 10*3/uL (ref 0.0–0.4)
EOS: 2 %
HEMATOCRIT: 42.1 % (ref 34.0–46.6)
HEMOGLOBIN: 13.9 g/dL (ref 11.1–15.9)
IMMATURE GRANS (ABS): 0.5 10*3/uL — AB (ref 0.0–0.1)
Immature Granulocytes: 5 %
LYMPHS: 35 %
Lymphocytes Absolute: 3.4 10*3/uL — ABNORMAL HIGH (ref 0.7–3.1)
MCH: 32.6 pg (ref 26.6–33.0)
MCHC: 33 g/dL (ref 31.5–35.7)
MCV: 99 fL — AB (ref 79–97)
Monocytes Absolute: 0.9 10*3/uL (ref 0.1–0.9)
Monocytes: 9 %
NEUTROS ABS: 4.9 10*3/uL (ref 1.4–7.0)
NEUTROS PCT: 49 %
Platelets: 339 10*3/uL (ref 150–379)
RBC: 4.27 x10E6/uL (ref 3.77–5.28)
RDW: 13.4 % (ref 12.3–15.4)
WBC: 9.9 10*3/uL (ref 3.4–10.8)

## 2015-10-23 LAB — CMP14+EGFR
A/G RATIO: 1.7 (ref 1.2–2.2)
ALBUMIN: 4.6 g/dL (ref 3.5–4.8)
ALT: 13 IU/L (ref 0–32)
AST: 14 IU/L (ref 0–40)
Alkaline Phosphatase: 80 IU/L (ref 39–117)
BILIRUBIN TOTAL: 0.5 mg/dL (ref 0.0–1.2)
BUN / CREAT RATIO: 19 (ref 12–28)
BUN: 19 mg/dL (ref 8–27)
CHLORIDE: 93 mmol/L — AB (ref 96–106)
CO2: 28 mmol/L (ref 18–29)
Calcium: 9.9 mg/dL (ref 8.7–10.3)
Creatinine, Ser: 1.02 mg/dL — ABNORMAL HIGH (ref 0.57–1.00)
GFR, EST AFRICAN AMERICAN: 64 mL/min/{1.73_m2} (ref >59)
GFR, EST NON AFRICAN AMERICAN: 55 mL/min/{1.73_m2} — AB (ref >59)
GLOBULIN, TOTAL: 2.7 g/dL (ref 1.5–4.5)
Glucose: 84 mg/dL (ref 65–99)
POTASSIUM: 4.1 mmol/L (ref 3.5–5.2)
SODIUM: 139 mmol/L (ref 134–144)
TOTAL PROTEIN: 7.3 g/dL (ref 6.0–8.5)

## 2015-10-23 LAB — C-REACTIVE PROTEIN: CRP: 0.8 mg/L (ref 0.0–4.9)

## 2015-10-23 LAB — TSH: TSH: 7.55 u[IU]/mL — ABNORMAL HIGH (ref 0.450–4.500)

## 2015-10-23 LAB — SEDIMENTATION RATE: SED RATE: 2 mm/h (ref 0–40)

## 2015-10-23 MED ORDER — LEVOTHYROXINE SODIUM 25 MCG PO TABS: 25.0000 ug | ORAL_TABLET | Freq: Every day | ORAL | 1 refills | Status: DC

## 2015-11-03 ENCOUNTER — Other Ambulatory Visit: Payer: Self-pay | Admitting: Family Medicine

## 2015-11-03 ENCOUNTER — Telehealth: Payer: Self-pay | Admitting: Family Medicine

## 2015-11-03 NOTE — Telephone Encounter (Signed)
I sent 45 pills in August. Please verify with pharmacy.

## 2015-11-03 NOTE — Telephone Encounter (Signed)
Patient notified of prescription at pharmacy.

## 2015-12-03 ENCOUNTER — Other Ambulatory Visit: Payer: Self-pay | Admitting: Family Medicine

## 2015-12-03 DIAGNOSIS — E785 Hyperlipidemia, unspecified: Secondary | ICD-10-CM

## 2015-12-03 NOTE — Telephone Encounter (Signed)
Patient requesting refill of Pravastatin be sent to [facility].

## 2015-12-04 ENCOUNTER — Other Ambulatory Visit: Payer: Self-pay | Admitting: Family Medicine

## 2015-12-04 ENCOUNTER — Other Ambulatory Visit: Payer: Self-pay

## 2015-12-04 DIAGNOSIS — E039 Hypothyroidism, unspecified: Secondary | ICD-10-CM

## 2015-12-10 LAB — TSH: TSH: 0.94 u[IU]/mL (ref 0.450–4.500)

## 2015-12-11 ENCOUNTER — Other Ambulatory Visit: Payer: Self-pay | Admitting: Family Medicine

## 2015-12-11 ENCOUNTER — Ambulatory Visit
Admission: RE | Admit: 2015-12-11 | Discharge: 2015-12-11 | Disposition: A | Discharge status: Home / Self Care | Payer: Federal, State, Local not specified - PPO | Source: Ambulatory Visit | Attending: Family Medicine | Admitting: Family Medicine

## 2015-12-11 DIAGNOSIS — E039 Hypothyroidism, unspecified: Secondary | ICD-10-CM

## 2015-12-11 DIAGNOSIS — Z1231 Encounter for screening mammogram for malignant neoplasm of breast: Secondary | ICD-10-CM | POA: Insufficient documentation

## 2015-12-11 MED ORDER — LEVOTHYROXINE SODIUM 25 MCG PO TABS: 25.0000 ug | ORAL_TABLET | Freq: Every day | ORAL | 5 refills | Status: DC

## 2016-01-07 ENCOUNTER — Other Ambulatory Visit: Payer: Self-pay | Admitting: Family Medicine

## 2016-01-07 ENCOUNTER — Ambulatory Visit (INDEPENDENT_AMBULATORY_CARE_PROVIDER_SITE_OTHER): Payer: Federal, State, Local not specified - PPO

## 2016-01-07 DIAGNOSIS — Z23 Encounter for immunization: Secondary | ICD-10-CM

## 2016-01-07 DIAGNOSIS — E785 Hyperlipidemia, unspecified: Secondary | ICD-10-CM

## 2016-01-07 DIAGNOSIS — K219 Gastro-esophageal reflux disease without esophagitis: Secondary | ICD-10-CM

## 2016-01-08 NOTE — Telephone Encounter (Signed)
Patient requesting refill of pravastatin, Ranitidine to  [facility].

## 2016-01-30 ENCOUNTER — Other Ambulatory Visit: Payer: Self-pay | Admitting: Family Medicine

## 2016-01-30 DIAGNOSIS — J3089 Other allergic rhinitis: Secondary | ICD-10-CM

## 2016-01-30 NOTE — Telephone Encounter (Signed)
Patient requesting refill of Flonase to [facility].

## 2016-03-19 ENCOUNTER — Other Ambulatory Visit: Payer: Self-pay | Admitting: Family Medicine

## 2016-03-19 ENCOUNTER — Telehealth: Payer: Self-pay | Admitting: Family Medicine

## 2016-03-19 DIAGNOSIS — E039 Hypothyroidism, unspecified: Secondary | ICD-10-CM

## 2016-03-19 DIAGNOSIS — E785 Hyperlipidemia, unspecified: Secondary | ICD-10-CM

## 2016-03-19 DIAGNOSIS — R600 Localized edema: Secondary | ICD-10-CM

## 2016-03-19 DIAGNOSIS — E538 Deficiency of other specified B group vitamins: Secondary | ICD-10-CM

## 2016-03-19 DIAGNOSIS — E559 Vitamin D deficiency, unspecified: Secondary | ICD-10-CM

## 2016-03-19 NOTE — Telephone Encounter (Signed)
Done, ready for pickup.

## 2016-03-19 NOTE — Telephone Encounter (Signed)
Pt has physical appointment scheduled for 03/30/16 and is asking for a lab order

## 2016-03-22 NOTE — Telephone Encounter (Signed)
Pt.notified

## 2016-03-26 LAB — LIPID PANEL W/O CHOL/HDL RATIO
Cholesterol, Total: 247 mg/dL — ABNORMAL HIGH (ref 100–199)
HDL: 74 mg/dL (ref >39)
LDL Calculated: 153 mg/dL — ABNORMAL HIGH (ref 0–99)
Triglycerides: 100 mg/dL (ref 0–149)
VLDL Cholesterol Cal: 20 mg/dL (ref 5–40)

## 2016-03-26 LAB — CBC
HEMATOCRIT: 37 % (ref 34.0–46.6)
Hemoglobin: 12.3 g/dL (ref 11.1–15.9)
MCH: 31.5 pg (ref 26.6–33.0)
MCHC: 33.2 g/dL (ref 31.5–35.7)
MCV: 95 fL (ref 79–97)
Platelets: 211 10*3/uL (ref 150–379)
RBC: 3.9 x10E6/uL (ref 3.77–5.28)
RDW: 13.9 % (ref 12.3–15.4)
WBC: 4.4 10*3/uL (ref 3.4–10.8)

## 2016-03-26 LAB — COMPREHENSIVE METABOLIC PANEL
ALBUMIN: 4.4 g/dL (ref 3.5–4.8)
ALT: 14 IU/L (ref 0–32)
AST: 19 IU/L (ref 0–40)
Albumin/Globulin Ratio: 1.9 (ref 1.2–2.2)
Alkaline Phosphatase: 72 IU/L (ref 39–117)
BILIRUBIN TOTAL: 0.3 mg/dL (ref 0.0–1.2)
BUN / CREAT RATIO: 12 (ref 12–28)
BUN: 11 mg/dL (ref 8–27)
CO2: 25 mmol/L (ref 18–29)
CREATININE: 0.9 mg/dL (ref 0.57–1.00)
Calcium: 9.7 mg/dL (ref 8.7–10.3)
Chloride: 97 mmol/L (ref 96–106)
GFR calc non Af Amer: 64 mL/min/{1.73_m2} (ref >59)
GFR, EST AFRICAN AMERICAN: 74 mL/min/{1.73_m2} (ref >59)
GLUCOSE: 85 mg/dL (ref 65–99)
Globulin, Total: 2.3 g/dL (ref 1.5–4.5)
Potassium: 4.3 mmol/L (ref 3.5–5.2)
Sodium: 139 mmol/L (ref 134–144)
TOTAL PROTEIN: 6.7 g/dL (ref 6.0–8.5)

## 2016-03-26 LAB — VITAMIN D 25 HYDROXY (VIT D DEFICIENCY, FRACTURES): Vit D, 25-Hydroxy: 56.9 ng/mL (ref 30.0–100.0)

## 2016-03-26 LAB — AMBIG ABBREV LP DEFAULT

## 2016-03-26 LAB — VITAMIN B12: Vitamin B-12: 480 pg/mL (ref 232–1245)

## 2016-03-26 LAB — TSH: TSH: 3.66 u[IU]/mL (ref 0.450–4.500)

## 2016-03-30 ENCOUNTER — Encounter: Payer: Self-pay | Admitting: Family Medicine

## 2016-03-30 ENCOUNTER — Ambulatory Visit (INDEPENDENT_AMBULATORY_CARE_PROVIDER_SITE_OTHER): Payer: Federal, State, Local not specified - PPO | Admitting: Family Medicine

## 2016-03-30 VITALS — BP 134/78 | HR 83 | Temp 98.0°F | Resp 16 | Ht 62.0 in | Wt 107.2 lb

## 2016-03-30 DIAGNOSIS — M810 Age-related osteoporosis without current pathological fracture: Secondary | ICD-10-CM | POA: Diagnosis not present

## 2016-03-30 DIAGNOSIS — E538 Deficiency of other specified B group vitamins: Secondary | ICD-10-CM | POA: Diagnosis not present

## 2016-03-30 DIAGNOSIS — E785 Hyperlipidemia, unspecified: Secondary | ICD-10-CM | POA: Diagnosis not present

## 2016-03-30 DIAGNOSIS — E039 Hypothyroidism, unspecified: Secondary | ICD-10-CM

## 2016-03-30 DIAGNOSIS — Z01419 Encounter for gynecological examination (general) (routine) without abnormal findings: Secondary | ICD-10-CM

## 2016-03-30 DIAGNOSIS — K625 Hemorrhage of anus and rectum: Secondary | ICD-10-CM | POA: Diagnosis not present

## 2016-03-30 DIAGNOSIS — Z0001 Encounter for general adult medical examination with abnormal findings: Secondary | ICD-10-CM

## 2016-03-30 DIAGNOSIS — J069 Acute upper respiratory infection, unspecified: Secondary | ICD-10-CM

## 2016-03-30 DIAGNOSIS — Z23 Encounter for immunization: Secondary | ICD-10-CM | POA: Diagnosis not present

## 2016-03-30 DIAGNOSIS — L308 Other specified dermatitis: Secondary | ICD-10-CM | POA: Diagnosis not present

## 2016-03-30 DIAGNOSIS — B9789 Other viral agents as the cause of diseases classified elsewhere: Secondary | ICD-10-CM

## 2016-03-30 MED ORDER — LEVOTHYROXINE SODIUM 25 MCG PO TABS: 25.0000 ug | ORAL_TABLET | Freq: Every day | ORAL | 5 refills | Status: DC

## 2016-03-30 MED ORDER — TRIAMCINOLONE ACETONIDE 0.1 % EX CREA: TOPICAL_CREAM | Freq: Two times a day (BID) | CUTANEOUS | 2 refills | Status: DC

## 2016-03-30 MED ORDER — PRAVASTATIN SODIUM 20 MG PO TABS: ORAL_TABLET | ORAL | 5 refills | Status: DC

## 2016-03-30 NOTE — Progress Notes (Signed)
Name: Heather Singh   MRN: [id number]    DOB: 03/23/1945   Date:03/30/2016       Progress Note  Subjective  Chief Complaint  Chief Complaint  Patient presents with  . Annual Exam    HPI  Well Woman: she is not under medicare yet, she uses private insurance. Discussed home safety and prevention. No vaginal dryness, occasionally cannot make to the bathroom - she had occasional incontinence episodes - seen by Dr. Ernst Spell in the past.   Hypothyroidism: she is feeling better, she denies dysphagia. She is compliant with medication, last TSH was at goal. She has dry skin.   Hyperlipidemia: not  taking Pravachol - ran out of prescription .No chest pain or SOB. She will resume medication today  Osteoporosis: seen by Dr. Jefm Bryant but Prolia too expensive, taking Vitamin D 2000 units daily, she will call the insurance company again.   Hive/Rash: she is doing better, rash was severe during this past Summer, seen by Dr. Donneta Romberg but he did not think it was related, seen by Dermatologist also and had a biopsy but no answers. Off Atarax and Alprazolam. Doing better now. Using topical medication prn, usually in the middle of her back   Rectal Bleeding: she has a gastroenteritis after Christmas and had some blood in stools when she wiped. She had a colonoscopy in 2015, we will send her home with hemoccult cards.   URI: she has mild nasal congestion, rhinorrhea and mild cough, symptoms started about one week ago and is doing better now, no fever.    Patient Active Problem List   Diagnosis Date Noted  . Atrophic vaginitis 06/05/2015  . Acquired hypothyroidism 10/19/2014  . Perennial allergic rhinitis 10/19/2014  . B12 deficiency 10/19/2014  . Fibrocystic breast disease 10/19/2014  . Gastro-esophageal reflux disease without esophagitis 10/19/2014  . OP (osteoporosis) 10/19/2014  . Ovarian failure 10/19/2014  . Hiatal hernia 10/19/2014  . Restless legs syndrome 10/19/2014  . ABNORMAL EKG  09/03/2009  . Dyslipidemia 08/23/2008  . Vitamin D deficiency 10/10/2007    Past Surgical History:  Procedure Laterality Date  . BREAST BIOPSY Left   . BREAST CYST ASPIRATION Left   . CATARACT EXTRACTION    . EYE SURGERY     left  . MOUTH SURGERY    . POLYPECTOMY    . SLING BLADE PROCEDURE    . TOTAL ABDOMINAL HYSTERECTOMY      Family History  Problem Relation Age of Onset  . Transient ischemic attack Mother   . Atrial fibrillation Mother   . Arthritis Mother     OA  . Heart disease Father     CHF  . CAD Father   . Hyperlipidemia Sister   . Arthritis Sister     OA  . Drug abuse Son   . Hypertension Son   . Stroke Sister   . Arthritis Sister     OA  . Heart failure Other   . Coronary artery disease Other   . Stroke Other   . Hyperlipidemia Other   . Hypertension Other   . Thyroid disease Other   . Breast cancer Maternal Aunt   . Breast cancer Paternal Aunt     Social History   Social History  . Marital status: Married    Spouse name: N/A  . Number of children: N/A  . Years of education: N/A   Occupational History  . Not on file.   Social History Main Topics  . Smoking status:  Never Smoker  . Smokeless tobacco: Never Used  . Alcohol use No  . Drug use: No  . Sexual activity: Yes    Partners: Male   Other Topics Concern  . Not on file   Social History Narrative   Married   Gets regular exercise     Current Outpatient Prescriptions:  .  aspirin 81 MG tablet, Take 81 mg by mouth daily.  , Disp: , Rfl:  .  fluticasone (FLONASE) 50 MCG/ACT nasal spray, SHAKE LIQUID AND USE 2 SPRAYS IN EACH NOSTRIL DAILY, Disp: 16 g, Rfl: 1 .  levothyroxine (SYNTHROID, LEVOTHROID) 25 MCG tablet, Take 1-2 tablets (25-50 mcg total) by mouth daily. Alternate one and two pills, Disp: 45 tablet, Rfl: 5 .  loratadine (CLARITIN) 10 MG tablet, Take 1 tablet by mouth daily., Disp: , Rfl:  .  pravastatin (PRAVACHOL) 20 MG tablet, TAKE 1 TABLET(20 MG) BY MOUTH DAILY, Disp:  30 tablet, Rfl: 5 .  ranitidine (ZANTAC) 150 MG tablet, TAKE 1 TABLET(150 MG) BY MOUTH TWICE DAILY, Disp: 60 tablet, Rfl: 2 .  triamcinolone cream (KENALOG) 0.1 %, Apply topically 2 (two) times daily., Disp: 80 g, Rfl: 2 .  vitamin B-12 (CYANOCOBALAMIN) 1000 MCG tablet, Take 1,000 mcg by mouth daily., Disp: , Rfl:  .  vitamin E 1000 UNIT capsule, Take 1,000 Units by mouth daily., Disp: , Rfl:   Allergies  Allergen Reactions  . Alendronate     dypspepsia  . Azithromycin   . Levofloxacin   . Nitrofurantoin   . Nitrofurantoin Monohyd Macro   . Penicillins   . Sulfamethoxazole-Trimethoprim   . Etodolac Rash     ROS  Constitutional: Negative for fever, positive for  weight change.  Respiratory: Positive for cough but no shortness of breath.   Cardiovascular: Negative for chest pain or palpitations.  Gastrointestinal: Negative for abdominal pain, no bowel changes.  Musculoskeletal: Negative for gait problem or joint swelling.  Skin: Positive for rash.  Neurological: Negative for dizziness or headache.  No other specific complaints in a complete review of systems (except as listed in HPI above).  Objective  Vitals:   03/30/16 0821  BP: 134/78  Pulse: 83  Resp: 16  Temp: 98 F (36.7 C)  SpO2: 97%  Weight: 107 lb 4 oz (48.6 kg)  Height: 5\' 2"  (1.575 m)    Body mass index is 19.62 kg/m.  Physical Exam  Constitutional: Patient appears well-developed and thin. No distress.  HENT: Head: Normocephalic and atraumatic. Ears: B TMs ok, no erythema or effusion; Nose: Nose normal. Mouth/Throat: Oropharynx is clear and moist. No oropharyngeal exudate.  Eyes: Conjunctivae and EOM are normal. Pupils are equal, round, and reactive to light. No scleral icterus.  Neck: Normal range of motion. Neck supple. No JVD present. No thyromegaly present.  Cardiovascular: Normal rate, regular rhythm and normal heart sounds.  No murmur heard. No BLE edema. Pulmonary/Chest: Effort normal and breath  sounds normal. No respiratory distress. Abdominal: Soft. Bowel sounds are normal, no distension. There is no tenderness. no masses Breast: no lumps or masses, no nipple discharge or rashes FEMALE GENITALIA:  External genitalia normal External urethra normal Pelvic not done RECTAL: no rectal masses or external  hemorrhoids  Musculoskeletal: Normal range of motion, no joint effusions. No gross deformities Neurological: he is alert and oriented to person, place, and time. No cranial nerve deficit. Coordination, balance, strength, speech and gait are normal.  Skin: Skin is warm and dry. No rash noted. No erythema.  Psychiatric: Patient has a normal mood and affect. behavior is normal. Judgment and thought content normal.  Recent Results (from the past 2160 hour(s))  VITAMIN D 25 Hydroxy (Vit-D Deficiency, Fractures)     Status: None   Collection Time: 03/25/16  9:37 AM  Result Value Ref Range   Vit D, 25-Hydroxy 56.9 30.0 - 100.0 ng/mL    Comment: Vitamin D deficiency has been defined by the [place] practice guideline as a level of serum 25-OH vitamin D less than 20 ng/mL (1,2). The Endocrine Society went on to further define vitamin D insufficiency as a level between 21 and 29 ng/mL (2). 1. IOM (Institute of Medicine). 2010. Dietary reference    intakes for calcium and D. [place]: The    [employer]. 2. Holick MF, Binkley [place], Bischoff-Ferrari HA, et al.    Evaluation, treatment, and prevention of vitamin D    deficiency: an Endocrine Society clinical practice    guideline. JCEM. 2011 Jul; 96(7):1911-30.   Vitamin B12     Status: None   Collection Time: 03/25/16  9:37 AM  Result Value Ref Range   Vitamin B-12 480 232 - 1,245 pg/mL  TSH     Status: None   Collection Time: 03/25/16  9:37 AM  Result Value Ref Range   TSH 3.660 0.450 - 4.500 uIU/mL  Comprehensive Metabolic Panel (CMET)     Status: None   Collection Time:  03/25/16  9:37 AM  Result Value Ref Range   Glucose 85 65 - 99 mg/dL   BUN 11 8 - 27 mg/dL   Creatinine, Ser 0.90 0.57 - 1.00 mg/dL   GFR calc non Af Amer 64 >59 mL/min/1.73   GFR calc Af Amer 74 >59 mL/min/1.73   BUN/Creatinine Ratio 12 12 - 28   Sodium 139 134 - 144 mmol/L   Potassium 4.3 3.5 - 5.2 mmol/L   Chloride 97 96 - 106 mmol/L   CO2 25 18 - 29 mmol/L   Calcium 9.7 8.7 - 10.3 mg/dL   Total Protein 6.7 6.0 - 8.5 g/dL   Albumin 4.4 3.5 - 4.8 g/dL   Globulin, Total 2.3 1.5 - 4.5 g/dL   Albumin/Globulin Ratio 1.9 1.2 - 2.2   Bilirubin Total 0.3 0.0 - 1.2 mg/dL   Alkaline Phosphatase 72 39 - 117 IU/L   AST 19 0 - 40 IU/L   ALT 14 0 - 32 IU/L  CBC     Status: None   Collection Time: 03/25/16  9:37 AM  Result Value Ref Range   WBC 4.4 3.4 - 10.8 x10E3/uL   RBC 3.90 3.77 - 5.28 x10E6/uL   Hemoglobin 12.3 11.1 - 15.9 g/dL   Hematocrit 37.0 34.0 - 46.6 %   MCV 95 79 - 97 fL   MCH 31.5 26.6 - 33.0 pg   MCHC 33.2 31.5 - 35.7 g/dL   RDW 13.9 12.3 - 15.4 %   Platelets 211 150 - 379 x10E3/uL  Lipid Panel w/o Chol/HDL Ratio     Status: Abnormal   Collection Time: 03/25/16  9:37 AM  Result Value Ref Range   Cholesterol, Total 247 (H) 100 - 199 mg/dL   Triglycerides 100 0 - 149 mg/dL   HDL 74 >39 mg/dL   VLDL Cholesterol Cal 20 5 - 40 mg/dL   LDL Calculated 153 (H) 0 - 99 mg/dL  Ambig Abbrev LP Default     Status: None   Collection Time: 03/25/16  9:37 AM  Result Value Ref Range   AMBIG ABBREV Comment     Comment: A hand-written panel/profile was received from your office. In accordance with the [facility] Ambiguous Test Code Policy dated July 123456, we have completed your order by using the closest currently or formerly recognized AMA panel.  We have assigned Lipid Panel, Test Code [phone] to this request. If this is not the testing you wished to receive on this specimen, please contact the [facility] Client Inquiry/Technical Services Department to clarify the test order.  We  appreciate your business.      PHQ2/9: Depression screen [facility] 2/9 03/30/2016 10/22/2015 04/24/2015 10/21/2014  Decreased Interest 0 0 0 0  Down, Depressed, Hopeless 0 0 0 0  PHQ - 2 Score 0 0 0 0     Fall Risk: Fall Risk  03/30/2016 10/22/2015 04/24/2015 10/21/2014  Falls in the past year? No No No No     Functional Status Survey: Is the patient deaf or have difficulty hearing?: No Does the patient have difficulty seeing, even when wearing glasses/contacts?: No Does the patient have difficulty concentrating, remembering, or making decisions?: No Does the patient have difficulty walking or climbing stairs?: No Does the patient have difficulty dressing or bathing?: No Does the patient have difficulty doing errands alone such as visiting a doctor's office or shopping?: No   Assessment & Plan  1. Well woman exam  Discussed importance of 150 minutes of physical activity weekly, eat two servings of fish weekly, eat one serving of tree nuts ( cashews, pistachios, pecans, almonds.Marland Kitchen) every other day, eat 6 servings of fruit/vegetables daily and drink plenty of water and avoid sweet beverages.    2. Acquired hypothyroidism  - levothyroxine (SYNTHROID, LEVOTHROID) 25 MCG tablet; Take 1-2 tablets (25-50 mcg total) by mouth daily. Alternate one and two pills  Dispense: 45 tablet; Refill: 5  3. Dyslipidemia  She was out of medication, LDL went up to 153  - pravastatin (PRAVACHOL) 20 MG tablet; TAKE 1 TABLET(20 MG) BY MOUTH DAILY  Dispense: 30 tablet; Refill: 5  4. B12 deficiency  Taking otc supplementation   5. Age-related osteoporosis without current pathological fracture  Not on medication, she saw Dr. Jefm Bryant, but decided not to start therapy because of cost  6. Other eczema  - triamcinolone cream (KENALOG) 0.1 %; Apply topically 2 (two) times daily.  Dispense: 80 g; Refill: 2  7. Rectal bleeding  - POC Hemoccult Bld/Stl (3-Cd Home Screen); Future  8. Need for Tdap vaccination  -  Tdap vaccine greater than or equal to 7yo IM  9. Viral upper respiratory tract infection  Improving now

## 2016-04-12 ENCOUNTER — Other Ambulatory Visit: Payer: Self-pay

## 2016-04-12 DIAGNOSIS — Z1211 Encounter for screening for malignant neoplasm of colon: Secondary | ICD-10-CM

## 2016-04-12 DIAGNOSIS — K625 Hemorrhage of anus and rectum: Secondary | ICD-10-CM

## 2016-04-12 LAB — POC HEMOCCULT BLD/STL (HOME/3-CARD/SCREEN)
FECAL OCCULT BLD: NEGATIVE
FECAL OCCULT BLD: NEGATIVE
Fecal Occult Blood, POC: NEGATIVE

## 2016-04-14 ENCOUNTER — Encounter: Payer: Self-pay | Admitting: Family Medicine

## 2016-04-14 ENCOUNTER — Telehealth: Payer: Self-pay | Admitting: Family Medicine

## 2016-04-14 ENCOUNTER — Ambulatory Visit (INDEPENDENT_AMBULATORY_CARE_PROVIDER_SITE_OTHER): Payer: Federal, State, Local not specified - PPO | Admitting: Family Medicine

## 2016-04-14 VITALS — BP 120/78 | HR 85 | Temp 98.0°F | Resp 16 | Ht 62.0 in | Wt 110.2 lb

## 2016-04-14 DIAGNOSIS — J0100 Acute maxillary sinusitis, unspecified: Secondary | ICD-10-CM | POA: Diagnosis not present

## 2016-04-14 DIAGNOSIS — K219 Gastro-esophageal reflux disease without esophagitis: Secondary | ICD-10-CM

## 2016-04-14 MED ORDER — RANITIDINE HCL 150 MG PO TABS: ORAL_TABLET | ORAL | 2 refills | Status: DC

## 2016-04-14 MED ORDER — CEFDINIR 300 MG PO CAPS: 300.0000 mg | ORAL_CAPSULE | Freq: Two times a day (BID) | ORAL | 0 refills | Status: DC

## 2016-04-14 NOTE — Telephone Encounter (Signed)
Pt states she was told to call back to let Dr Ancil Boozer know what she was taking in the past., she can take Cephalexine. Pt would like a call back about the Z Pack and the date is was given to her in the past. Pt does not recall ever having this medication.

## 2016-04-14 NOTE — Progress Notes (Signed)
Name: Heather Singh   MRN: [id number]    DOB: [birth year]   Date:04/14/2016       Progress Note  Subjective  Chief Complaint  Chief Complaint  Patient presents with  . Facial Pain    4 weeks with couugh and facial pain  . Medication Refill    ranitidine    HPI  Sinusitis: she states symptoms started about 5 weeks ago, initially cold symptoms, but continues to have facial pressure, toothache at times, pressure behind her eyes, post-nasal drainage and some cough at night from the drainage. No fever. She has tried multiple otc medications without help   Patient Active Problem List   Diagnosis Date Noted  . Atrophic vaginitis 06/05/2015  . Acquired hypothyroidism 10/19/2014  . Perennial allergic rhinitis 10/19/2014  . B12 deficiency 10/19/2014  . Fibrocystic breast disease 10/19/2014  . Gastro-esophageal reflux disease without esophagitis 10/19/2014  . OP (osteoporosis) 10/19/2014  . Ovarian failure 10/19/2014  . Hiatal hernia 10/19/2014  . Restless legs syndrome 10/19/2014  . ABNORMAL EKG 09/03/2009  . Dyslipidemia 08/23/2008  . Vitamin D deficiency 10/10/2007    Past Surgical History:  Procedure Laterality Date  . BREAST BIOPSY Left   . BREAST CYST ASPIRATION Left   . CATARACT EXTRACTION    . EYE SURGERY     left  . MOUTH SURGERY    . POLYPECTOMY    . SLING BLADE PROCEDURE    . TOTAL ABDOMINAL HYSTERECTOMY      Family History  Problem Relation Age of Onset  . Transient ischemic attack Mother   . Atrial fibrillation Mother   . Arthritis Mother     OA  . Heart disease Father     CHF  . CAD Father   . Hyperlipidemia Sister   . Arthritis Sister     OA  . Drug abuse Son   . Hypertension Son   . Stroke Sister   . Arthritis Sister     OA  . Heart failure Other   . Coronary artery disease Other   . Stroke Other   . Hyperlipidemia Other   . Hypertension Other   . Thyroid disease Other   . Breast cancer Maternal Aunt   . Breast cancer Paternal Aunt      Social History   Social History  . Marital status: Married    Spouse name: N/A  . Number of children: N/A  . Years of education: N/A   Occupational History  . Not on file.   Social History Main Topics  . Smoking status: Never Smoker  . Smokeless tobacco: Never Used  . Alcohol use No  . Drug use: No  . Sexual activity: Yes    Partners: Male   Other Topics Concern  . Not on file   Social History Narrative   Married   Gets regular exercise     Current Outpatient Prescriptions:  .  aspirin 81 MG tablet, Take 81 mg by mouth daily.  , Disp: , Rfl:  .  fluticasone (FLONASE) 50 MCG/ACT nasal spray, SHAKE LIQUID AND USE 2 SPRAYS IN EACH NOSTRIL DAILY, Disp: 16 g, Rfl: 1 .  levothyroxine (SYNTHROID, LEVOTHROID) 25 MCG tablet, Take 1-2 tablets (25-50 mcg total) by mouth daily. Alternate one and two pills, Disp: 45 tablet, Rfl: 5 .  loratadine (CLARITIN) 10 MG tablet, Take 1 tablet by mouth daily., Disp: , Rfl:  .  pravastatin (PRAVACHOL) 20 MG tablet, TAKE 1 TABLET(20 MG) BY MOUTH DAILY, Disp: 30  tablet, Rfl: 5 .  ranitidine (ZANTAC) 150 MG tablet, TAKE 1 TABLET(150 MG) BY MOUTH TWICE DAILY, Disp: 60 tablet, Rfl: 2 .  triamcinolone cream (KENALOG) 0.1 %, Apply topically 2 (two) times daily., Disp: 80 g, Rfl: 2 .  vitamin B-12 (CYANOCOBALAMIN) 1000 MCG tablet, Take 1,000 mcg by mouth daily., Disp: , Rfl:  .  vitamin E 1000 UNIT capsule, Take 1,000 Units by mouth daily., Disp: , Rfl:   Allergies  Allergen Reactions  . Alendronate     dypspepsia  . Azithromycin   . Levofloxacin   . Nitrofurantoin   . Nitrofurantoin Monohyd Macro   . Penicillins   . Sulfamethoxazole-Trimethoprim   . Etodolac Rash     ROS  Ten systems reviewed and is negative except as mentioned in HPI   Objective  Vitals:   04/14/16 1158  BP: 120/78  Pulse: 85  Resp: 16  Temp: 98 F (36.7 C)  SpO2: 96%  Weight: 110 lb 3 oz (50 kg)  Height: 5\' 2"  (1.575 m)    Body mass index is 20.15  kg/m.  Physical Exam  Constitutional: Patient appears well-developed and well-nourished.  No distress.  HEENT: head atraumatic, normocephalic, pupils equal and reactive to light, ears normal TM bilaterally,  neck supple, throat within normal limits, tender maxillary sinus during percussion  Cardiovascular: Normal rate, regular rhythm and normal heart sounds.  No murmur heard. No BLE edema. Pulmonary/Chest: Effort normal and breath sounds normal. No respiratory distress. Abdominal: Soft.  There is no tenderness. Psychiatric: Patient has a normal mood and affect. behavior is normal. Judgment and thought content normal.  Recent Results (from the past 2160 hour(s))  VITAMIN D 25 Hydroxy (Vit-D Deficiency, Fractures)     Status: None   Collection Time: 03/25/16  9:37 AM  Result Value Ref Range   Vit D, 25-Hydroxy 56.9 30.0 - 100.0 ng/mL    Comment: Vitamin D deficiency has been defined by the [place] practice guideline as a level of serum 25-OH vitamin D less than 20 ng/mL (1,2). The Endocrine Society went on to further define vitamin D insufficiency as a level between 21 and 29 ng/mL (2). 1. IOM (Institute of Medicine). 2010. Dietary reference    intakes for calcium and D. [place]: The    [employer]. 2. Holick MF, Binkley , Bischoff-Ferrari HA, et al.    Evaluation, treatment, and prevention of vitamin D    deficiency: an Endocrine Society clinical practice    guideline. JCEM. 2011 Jul; 96(7):1911-30.   Vitamin B12     Status: None   Collection Time: 03/25/16  9:37 AM  Result Value Ref Range   Vitamin B-12 480 232 - 1,245 pg/mL  TSH     Status: None   Collection Time: 03/25/16  9:37 AM  Result Value Ref Range   TSH 3.660 0.450 - 4.500 uIU/mL  Comprehensive Metabolic Panel (CMET)     Status: None   Collection Time: 03/25/16  9:37 AM  Result Value Ref Range   Glucose 85 65 - 99 mg/dL   BUN 11 8 - 27 mg/dL   Creatinine,  Ser 0.90 0.57 - 1.00 mg/dL   GFR calc non Af Amer 64 >59 mL/min/1.73   GFR calc Af Amer 74 >59 mL/min/1.73   BUN/Creatinine Ratio 12 12 - 28   Sodium 139 134 - 144 mmol/L   Potassium 4.3 3.5 - 5.2 mmol/L   Chloride 97 96 - 106 mmol/L  CO2 25 18 - 29 mmol/L   Calcium 9.7 8.7 - 10.3 mg/dL   Total Protein 6.7 6.0 - 8.5 g/dL   Albumin 4.4 3.5 - 4.8 g/dL   Globulin, Total 2.3 1.5 - 4.5 g/dL   Albumin/Globulin Ratio 1.9 1.2 - 2.2   Bilirubin Total 0.3 0.0 - 1.2 mg/dL   Alkaline Phosphatase 72 39 - 117 IU/L   AST 19 0 - 40 IU/L   ALT 14 0 - 32 IU/L  CBC     Status: None   Collection Time: 03/25/16  9:37 AM  Result Value Ref Range   WBC 4.4 3.4 - 10.8 x10E3/uL   RBC 3.90 3.77 - 5.28 x10E6/uL   Hemoglobin 12.3 11.1 - 15.9 g/dL   Hematocrit 37.0 34.0 - 46.6 %   MCV 95 79 - 97 fL   MCH 31.5 26.6 - 33.0 pg   MCHC 33.2 31.5 - 35.7 g/dL   RDW 13.9 12.3 - 15.4 %   Platelets 211 150 - 379 x10E3/uL  Lipid Panel w/o Chol/HDL Ratio     Status: Abnormal   Collection Time: 03/25/16  9:37 AM  Result Value Ref Range   Cholesterol, Total 247 (H) 100 - 199 mg/dL   Triglycerides 100 0 - 149 mg/dL   HDL 74 >39 mg/dL   VLDL Cholesterol Cal 20 5 - 40 mg/dL   LDL Calculated 153 (H) 0 - 99 mg/dL  Ambig Abbrev LP Default     Status: None   Collection Time: 03/25/16  9:37 AM  Result Value Ref Range   AMBIG ABBREV Comment     Comment: A hand-written panel/profile was received from your office. In accordance with the [facility] Ambiguous Test Code Policy dated July 123456, we have completed your order by using the closest currently or formerly recognized AMA panel.  We have assigned Lipid Panel, Test Code [phone] to this request. If this is not the testing you wished to receive on this specimen, please contact the [facility] Client Inquiry/Technical Services Department to clarify the test order.  We appreciate your business.   POC Hemoccult Bld/Stl (3-Cd Home Screen)     Status: Normal   Collection Time:  04/12/16 10:51 AM  Result Value Ref Range   Card #1 Date 04/10/16    Fecal Occult Blood, POC Negative Negative   Card #2 Date 04/11/16    Card #2 Fecal Occult Blod, POC Negative    Card #3 Date 04/12/16    Card #3 Fecal Occult Blood, POC Negative       PHQ2/9: Depression screen [facility] 2/9 03/30/2016 10/22/2015 04/24/2015 10/21/2014  Decreased Interest 0 0 0 0  Down, Depressed, Hopeless 0 0 0 0  PHQ - 2 Score 0 0 0 0     Fall Risk: Fall Risk  03/30/2016 10/22/2015 04/24/2015 10/21/2014  Falls in the past year? No No No No     Assessment & Plan  1. Subacute maxillary sinusitis  She is allergic to multiple antibiotics, advised her to call back with the medication she has taken before without side effects and she states she can take Cephalexin, and not sure when she took Azithromycin and what reaction she had. We will try Omnicef.

## 2016-04-15 NOTE — Telephone Encounter (Signed)
Went into old system and pt was given a zpak 03/17/2011 there is not any documentation of an allergy to it in that record. Pt stated that she spoke to pharmacist and that they do not keep records past 2 years

## 2016-04-15 NOTE — Telephone Encounter (Signed)
Heather Singh ( cousin of cephalexin ) to her pharmacy. Explained to her that we are in a new system. It may be best to find out from pharmacy if she has filled the rx there.

## 2016-04-23 ENCOUNTER — Ambulatory Visit: Payer: Federal, State, Local not specified - PPO | Admitting: Family Medicine

## 2016-04-30 ENCOUNTER — Telehealth: Payer: Self-pay | Admitting: Family Medicine

## 2016-04-30 ENCOUNTER — Other Ambulatory Visit: Payer: Self-pay | Admitting: Family Medicine

## 2016-04-30 DIAGNOSIS — J0100 Acute maxillary sinusitis, unspecified: Secondary | ICD-10-CM

## 2016-04-30 MED ORDER — BENZONATATE 100 MG PO CAPS: 100.0000 mg | ORAL_CAPSULE | Freq: Three times a day (TID) | ORAL | 0 refills | Status: DC | PRN

## 2016-04-30 NOTE — Telephone Encounter (Signed)
Pt informed that you are placing a referral but in the mean time would like to know what can she do about the headaches and the cough for the weekend. Please Heather Singh

## 2016-04-30 NOTE — Telephone Encounter (Signed)
PT SAID THAT SHE IS FINISHED HER ANTIOBOTIC YESTERDAY AND SHE WAS GETTING BETTER BUT IS STILL HAVING THE COUGH WITH LOTS OF DRAINAGE WITH HEADACHES. PLEASE CALL AND ADVISE

## 2016-04-30 NOTE — Telephone Encounter (Signed)
Pt verbally informed. °

## 2016-04-30 NOTE — Telephone Encounter (Signed)
I recommend otc Tylenol or Advil for headache or go to Urgent Care if severe Benzonate sent to pharmacy for cough.

## 2016-04-30 NOTE — Telephone Encounter (Signed)
I will enter referral to ENT

## 2016-06-08 ENCOUNTER — Ambulatory Visit (INDEPENDENT_AMBULATORY_CARE_PROVIDER_SITE_OTHER): Payer: Federal, State, Local not specified - PPO

## 2016-06-08 ENCOUNTER — Ambulatory Visit (INDEPENDENT_AMBULATORY_CARE_PROVIDER_SITE_OTHER): Payer: Federal, State, Local not specified - PPO | Admitting: Podiatry

## 2016-06-08 VITALS — BP 149/90 | HR 61 | Resp 16

## 2016-06-08 DIAGNOSIS — M7751 Other enthesopathy of right foot: Secondary | ICD-10-CM | POA: Diagnosis not present

## 2016-06-08 DIAGNOSIS — S9031XA Contusion of right foot, initial encounter: Secondary | ICD-10-CM | POA: Diagnosis not present

## 2016-06-08 DIAGNOSIS — M79671 Pain in right foot: Secondary | ICD-10-CM | POA: Diagnosis not present

## 2016-06-08 DIAGNOSIS — M779 Enthesopathy, unspecified: Secondary | ICD-10-CM

## 2016-06-08 DIAGNOSIS — M778 Other enthesopathies, not elsewhere classified: Secondary | ICD-10-CM

## 2016-06-11 MED ORDER — BETAMETHASONE SOD PHOS & ACET 6 (3-3) MG/ML IJ SUSP: 3.0000 mg | Freq: Once | INTRAMUSCULAR | Status: DC

## 2016-06-11 NOTE — Progress Notes (Signed)
   Subjective:  71 year old female presents as a new patient for evaluation of right lateral foot pain and swelling this been going on for approximately 3 weeks. Patient states that is painful to walk. Patient denies trauma. Patient presents today for further treatment and evaluation    Objective/Physical Exam General: The patient is alert and oriented x3 in no acute distress.  Dermatology: Skin is warm, dry and supple bilateral lower extremities. Negative for open lesions or macerations.  Vascular: Palpable pedal pulses bilaterally. No edema or erythema noted. Capillary refill within normal limits.  Neurological: Epicritic and protective threshold grossly intact bilaterally.   Musculoskeletal Exam: Range of motion within normal limits to all pedal and ankle joints bilateral. Muscle strength 5/5 in all groups bilateral.  Pain on palpation noted to the base of the fourth metatarsal at the tarsometatarsal joint. There is some localized ecchymosis and edema noted surrounding the point of pain.  Radiographic Exam:  Normal osseous mineralization. Joint spaces preserved. No fracture/dislocation/boney destruction.    Assessment: #1 capsulitis tarsometatarsal joint right foot lateral #2 ecchymosis right lateral foot   Plan of Care:  #1 Patient was evaluated. #2 injection 0.5 mL Celestone Soluspan injected in the tarsometatarsal joint right foot laterally #3 compression anklet dispensed #4 immobilization cam boot dispensed #5 continue ibuprofen as needed  #6 return to clinic in 4 weeks    Edrick Kins, DPM [facility]  Dr. Edrick Kins, [address]                                        [place], [place] [address]                Office [phone]  Fax [phone]

## 2016-06-22 ENCOUNTER — Telehealth: Payer: Self-pay | Admitting: *Deleted

## 2016-06-22 NOTE — Telephone Encounter (Signed)
Pt states she is having problems with the boot she was given 2 weeks ago. I spoke with pt and she said the boot is causing her back to hurt. I told pt to wear a shoe with the same height sole as the cam boot. Pt states she may be walking too much and I told her that was my next suggestion to decrease her activity to the ADLs. Pt state the compression sock is too tight at the ball of her foot and causes the ball of the foot to hurt. I told pt is she was not having swelling she did not need to wear the compression sock. Pt states understanding.

## 2016-07-06 ENCOUNTER — Ambulatory Visit (INDEPENDENT_AMBULATORY_CARE_PROVIDER_SITE_OTHER): Payer: Federal, State, Local not specified - PPO | Admitting: Podiatry

## 2016-07-06 DIAGNOSIS — M779 Enthesopathy, unspecified: Principal | ICD-10-CM

## 2016-07-06 DIAGNOSIS — M7751 Other enthesopathy of right foot: Secondary | ICD-10-CM

## 2016-07-06 DIAGNOSIS — M778 Other enthesopathies, not elsewhere classified: Secondary | ICD-10-CM

## 2016-07-06 DIAGNOSIS — M79671 Pain in right foot: Secondary | ICD-10-CM | POA: Diagnosis not present

## 2016-07-07 NOTE — Progress Notes (Signed)
   Subjective:  71 year old female presents for follow up evaluation of right lateral foot pain and swelling. Patient states she is still experiencing some pain when wearing CAM boot. She reports some relief with the injections at previous visit. Patient presents today for further treatment and evaluation    Objective/Physical Exam General: The patient is alert and oriented x3 in no acute distress.  Dermatology: Skin is warm, dry and supple bilateral lower extremities. Negative for open lesions or macerations.  Vascular: Palpable pedal pulses bilaterally. No edema or erythema noted. Capillary refill within normal limits.  Neurological: Epicritic and protective threshold grossly intact bilaterally.   Musculoskeletal Exam: Range of motion within normal limits to all pedal and ankle joints bilateral. Muscle strength 5/5 in all groups bilateral.  Minimal pain on palpation noted to the base of the fourth metatarsal at the tarsometatarsal joint.   Assessment: #1 capsulitis tarsometatarsal joint right foot lateral- resolved #2 capsulitis/metatarsalgia right foot   Plan of Care:  #1 Patient was evaluated. #2 appt with Liliane Channel, pedorthist for orthotics  #3 discontinue wearing CAM boot #4 return to clinic when necessary   Edrick Kins, DPM [facility]  Dr. Edrick Kins, [address]                                        [place], [place] [address]                Office [phone]  Fax [phone]

## 2016-08-10 ENCOUNTER — Other Ambulatory Visit: Payer: Self-pay | Admitting: Unknown Physician Specialty

## 2016-08-10 DIAGNOSIS — S32030A Wedge compression fracture of third lumbar vertebra, initial encounter for closed fracture: Secondary | ICD-10-CM

## 2016-08-12 ENCOUNTER — Ambulatory Visit
Admission: RE | Admit: 2016-08-12 | Discharge: 2016-08-12 | Disposition: A | Discharge status: Home / Self Care | Payer: Federal, State, Local not specified - PPO | Source: Ambulatory Visit | Attending: Unknown Physician Specialty | Admitting: Unknown Physician Specialty

## 2016-08-12 DIAGNOSIS — M48061 Spinal stenosis, lumbar region without neurogenic claudication: Secondary | ICD-10-CM | POA: Insufficient documentation

## 2016-08-12 DIAGNOSIS — S32030A Wedge compression fracture of third lumbar vertebra, initial encounter for closed fracture: Secondary | ICD-10-CM | POA: Diagnosis present

## 2016-08-12 DIAGNOSIS — M5126 Other intervertebral disc displacement, lumbar region: Secondary | ICD-10-CM | POA: Insufficient documentation

## 2016-08-12 DIAGNOSIS — R29890 Loss of height: Secondary | ICD-10-CM | POA: Diagnosis not present

## 2016-08-12 DIAGNOSIS — M4856XA Collapsed vertebra, not elsewhere classified, lumbar region, initial encounter for fracture: Secondary | ICD-10-CM | POA: Insufficient documentation

## 2016-08-16 ENCOUNTER — Encounter
Admission: RE | Admit: 2016-08-16 | Discharge: 2016-08-16 | Disposition: A | Discharge status: Home / Self Care | Payer: Federal, State, Local not specified - PPO | Source: Ambulatory Visit | Attending: Orthopedic Surgery | Admitting: Orthopedic Surgery

## 2016-08-16 DIAGNOSIS — K219 Gastro-esophageal reflux disease without esophagitis: Secondary | ICD-10-CM | POA: Diagnosis not present

## 2016-08-16 DIAGNOSIS — I10 Essential (primary) hypertension: Secondary | ICD-10-CM | POA: Diagnosis not present

## 2016-08-16 DIAGNOSIS — M4856XA Collapsed vertebra, not elsewhere classified, lumbar region, initial encounter for fracture: Secondary | ICD-10-CM | POA: Diagnosis not present

## 2016-08-16 DIAGNOSIS — G2581 Restless legs syndrome: Secondary | ICD-10-CM | POA: Diagnosis not present

## 2016-08-16 DIAGNOSIS — Z79899 Other long term (current) drug therapy: Secondary | ICD-10-CM | POA: Diagnosis not present

## 2016-08-16 DIAGNOSIS — E039 Hypothyroidism, unspecified: Secondary | ICD-10-CM | POA: Diagnosis not present

## 2016-08-16 HISTORY — DX: Dorsalgia, unspecified: M54.9

## 2016-08-16 HISTORY — DX: Gastro-esophageal reflux disease without esophagitis: K21.9

## 2016-08-16 HISTORY — DX: Other difficulties with micturition: R39.198

## 2016-08-16 LAB — SURGICAL PCR SCREEN
MRSA, PCR: NEGATIVE
STAPHYLOCOCCUS AUREUS: POSITIVE — AB

## 2016-08-16 NOTE — Patient Instructions (Signed)
Your procedure is scheduled on: 08/19/16 Thurs Report to Same Day Surgery 2nd floor [facility] Entrance-take elevator on left to 2nd floor.  Check in with surgery information desk.) To find out your arrival time please call [phone] between 1PM - 3PM on 08/18/16 Wed  Remember: Instructions that are not followed completely may result in serious medical risk, up to and including death, or upon the discretion of your surgeon and anesthesiologist your surgery may need to be rescheduled.    _x___ 1. Do not eat food or drink liquids after midnight. No gum chewing or                              hard candies.     __x__ 2. No Alcohol for 24 hours before or after surgery.   __x__3. No Smoking for 24 prior to surgery.   ____  4. Bring all medications with you on the day of surgery if instructed.    __x__ 5. Notify your doctor if there is any change in your medical condition     (cold, fever, infections).     Do not wear jewelry, make-up, hairpins, clips or nail polish.  Do not wear lotions, powders, or perfumes. You may wear deodorant.  Do not shave 48 hours prior to surgery. Men may shave face and neck.  Do not bring valuables to the hospital.    [facility] is not responsible for any belongings or valuables.               Contacts, dentures or bridgework may not be worn into surgery.  Leave your suitcase in the car. After surgery it may be brought to your room.  For patients admitted to the hospital, discharge time is determined by your                       treatment team.   Patients discharged the day of surgery will not be allowed to drive home.  You will need someone to drive you home and stay with you the night of your procedure.    Please read over the following fact sheets that you were given:   [facility] Preparing for Surgery and or MRSA Information   _x___ Take anti-hypertensive (unless it includes a diuretic), cardiac, seizure, asthma,     anti-reflux and  psychiatric medicines. These include:  1. levothyroxine (SYNTHROID,   2.omeprazole (PRILOSEC  3.pravastatin (PRAVACHOL  4.  5.  6.  ____Fleets enema or Magnesium Citrate as directed.   _x___ Use CHG Soap or sage wipes as directed on instruction sheet   ____ Use inhalers on the day of surgery and bring to hospital day of surgery  ____ Stop Metformin and Janumet 2 days prior to surgery.    ____ Take 1/2 of usual insulin dose the night before surgery and none on the morning     surgery.   _x___ Follow recommendations from Cardiologist, Pulmonologist or PCP regarding          stopping Aspirin, Coumadin, Pllavix ,Eliquis, Effient, or Pradaxa, and Pletal.  X____Stop Anti-inflammatories such as Advil, Aleve, Ibuprofen, Motrin, Naproxen, Naprosyn, Goodies powders or aspirin products. OK to take Tylenol and                          Celebrex.   _x___ Stop supplements until after surgery.  But may continue  Vitamin D, Vitamin B,       and multivitamin.   ____ Bring C-Pap to the hospital.

## 2016-08-19 ENCOUNTER — Ambulatory Visit: Payer: Federal, State, Local not specified - PPO | Admitting: Anesthesiology

## 2016-08-19 ENCOUNTER — Ambulatory Visit
Admission: RE | Admit: 2016-08-19 | Discharge: 2016-08-19 | Disposition: A | Discharge status: Home / Self Care | Payer: Federal, State, Local not specified - PPO | Source: Ambulatory Visit | Attending: Orthopedic Surgery | Admitting: Orthopedic Surgery

## 2016-08-19 ENCOUNTER — Ambulatory Visit: Payer: Federal, State, Local not specified - PPO

## 2016-08-19 ENCOUNTER — Encounter
Admission: RE | Disposition: A | Discharge status: Home / Self Care | Payer: Self-pay | Source: Ambulatory Visit | Attending: Orthopedic Surgery

## 2016-08-19 DIAGNOSIS — I10 Essential (primary) hypertension: Secondary | ICD-10-CM | POA: Insufficient documentation

## 2016-08-19 DIAGNOSIS — K219 Gastro-esophageal reflux disease without esophagitis: Secondary | ICD-10-CM | POA: Insufficient documentation

## 2016-08-19 DIAGNOSIS — M4856XA Collapsed vertebra, not elsewhere classified, lumbar region, initial encounter for fracture: Secondary | ICD-10-CM | POA: Diagnosis not present

## 2016-08-19 DIAGNOSIS — Z79899 Other long term (current) drug therapy: Secondary | ICD-10-CM | POA: Insufficient documentation

## 2016-08-19 DIAGNOSIS — E039 Hypothyroidism, unspecified: Secondary | ICD-10-CM | POA: Insufficient documentation

## 2016-08-19 DIAGNOSIS — Z419 Encounter for procedure for purposes other than remedying health state, unspecified: Secondary | ICD-10-CM

## 2016-08-19 DIAGNOSIS — G2581 Restless legs syndrome: Secondary | ICD-10-CM | POA: Insufficient documentation

## 2016-08-19 HISTORY — PX: KYPHOPLASTY: SHX5884

## 2016-08-19 SURGERY — KYPHOPLASTY
Anesthesia: Monitor Anesthesia Care | Site: Spine Lumbar | Wound class: Clean

## 2016-08-19 MED ORDER — FENTANYL CITRATE (PF) 100 MCG/2ML IJ SOLN: 25.0000 ug | INTRAMUSCULAR | Status: DC | PRN

## 2016-08-19 MED ORDER — IOPAMIDOL (ISOVUE-M 200) INJECTION 41%
INTRAMUSCULAR | Status: AC
  Filled 2016-08-19: qty 20

## 2016-08-19 MED ORDER — LIDOCAINE HCL (PF) 1 % IJ SOLN
INTRAMUSCULAR | Status: AC
  Filled 2016-08-19: qty 30

## 2016-08-19 MED ORDER — PROPOFOL 500 MG/50ML IV EMUL: INTRAVENOUS | Status: DC | PRN

## 2016-08-19 MED ORDER — METOCLOPRAMIDE HCL 10 MG PO TABS: 5.0000 mg | ORAL_TABLET | Freq: Three times a day (TID) | ORAL | Status: DC | PRN

## 2016-08-19 MED ORDER — METOCLOPRAMIDE HCL 5 MG/ML IJ SOLN: 5.0000 mg | Freq: Three times a day (TID) | INTRAMUSCULAR | Status: DC | PRN

## 2016-08-19 MED ORDER — ONDANSETRON HCL 4 MG PO TABS: 4.0000 mg | ORAL_TABLET | Freq: Four times a day (QID) | ORAL | Status: DC | PRN

## 2016-08-19 MED ORDER — CLINDAMYCIN PHOSPHATE 900 MG/50ML IV SOLN
INTRAVENOUS | Status: AC
  Filled 2016-08-19: qty 50

## 2016-08-19 MED ORDER — FENTANYL CITRATE (PF) 100 MCG/2ML IJ SOLN
INTRAMUSCULAR | Status: DC | PRN
  Administered 2016-08-19: 25 ug via INTRAVENOUS
  Administered 2016-08-19: 50 ug via INTRAVENOUS

## 2016-08-19 MED ORDER — MIDAZOLAM HCL 2 MG/2ML IJ SOLN
INTRAMUSCULAR | Status: DC | PRN
  Administered 2016-08-19: 1 mg via INTRAVENOUS
  Administered 2016-08-19: 0.5 mg via INTRAVENOUS

## 2016-08-19 MED ORDER — LACTATED RINGERS IV SOLN
INTRAVENOUS | Status: DC
  Administered 2016-08-19 (×2): via INTRAVENOUS

## 2016-08-19 MED ORDER — ONDANSETRON HCL 4 MG/2ML IJ SOLN: 4.0000 mg | Freq: Once | INTRAMUSCULAR | Status: DC | PRN

## 2016-08-19 MED ORDER — SODIUM CHLORIDE 0.9 % IV SOLN: INTRAVENOUS | Status: DC

## 2016-08-19 MED ORDER — FENTANYL CITRATE (PF) 100 MCG/2ML IJ SOLN
INTRAMUSCULAR | Status: AC
  Filled 2016-08-19: qty 2

## 2016-08-19 MED ORDER — HYDROCODONE-ACETAMINOPHEN 5-325 MG PO TABS: 1.0000 | ORAL_TABLET | ORAL | Status: DC | PRN

## 2016-08-19 MED ORDER — BUPIVACAINE-EPINEPHRINE (PF) 0.5% -1:200000 IJ SOLN
INTRAMUSCULAR | Status: AC
  Filled 2016-08-19: qty 30

## 2016-08-19 MED ORDER — ONDANSETRON HCL 4 MG/2ML IJ SOLN: 4.0000 mg | Freq: Four times a day (QID) | INTRAMUSCULAR | Status: DC | PRN

## 2016-08-19 MED ORDER — MIDAZOLAM HCL 2 MG/2ML IJ SOLN
INTRAMUSCULAR | Status: AC
  Filled 2016-08-19: qty 2

## 2016-08-19 MED ORDER — PROPOFOL 10 MG/ML IV BOLUS
INTRAVENOUS | Status: DC | PRN
  Administered 2016-08-19 (×2): 10 mg via INTRAVENOUS
  Administered 2016-08-19: 15 mg via INTRAVENOUS

## 2016-08-19 MED ORDER — CLINDAMYCIN PHOSPHATE 900 MG/50ML IV SOLN
900.0000 mg | Freq: Once | INTRAVENOUS | Status: AC
  Administered 2016-08-19: 900 mg via INTRAVENOUS

## 2016-08-19 MED ORDER — HYDROCODONE-ACETAMINOPHEN 5-325 MG PO TABS: 1.0000 | ORAL_TABLET | Freq: Four times a day (QID) | ORAL | 0 refills | Status: DC | PRN

## 2016-08-19 SURGICAL SUPPLY — 16 items
CEMENT BONE KYPHON CDS (Cement) ×2 IMPLANT
DERMABOND ADVANCED (GAUZE/BANDAGES/DRESSINGS) ×1
DERMABOND ADVANCED .7 DNX12 (GAUZE/BANDAGES/DRESSINGS) ×1 IMPLANT
DEVICE BIOPSY BONE KYPHX (INSTRUMENTS) ×2 IMPLANT
DRAPE C-ARM XRAY 36X54 (DRAPES) ×2 IMPLANT
DURAPREP 26ML APPLICATOR (WOUND CARE) ×2 IMPLANT
GLOVE SURG SYN 9.0  PF PI (GLOVE) ×1
GLOVE SURG SYN 9.0 PF PI (GLOVE) ×1 IMPLANT
GOWN SRG 2XL LVL 4 RGLN SLV (GOWNS) ×1 IMPLANT
GOWN STRL NON-REIN 2XL LVL4 (GOWNS) ×1
GOWN STRL REUS W/ TWL LRG LVL3 (GOWN DISPOSABLE) ×1 IMPLANT
GOWN STRL REUS W/TWL LRG LVL3 (GOWN DISPOSABLE) ×1
PACK KYPHOPLASTY (MISCELLANEOUS) ×2 IMPLANT
STRAP SAFETY BODY (MISCELLANEOUS) ×2 IMPLANT
TRAY KYPHOPAK 15/3 EXPRESS 1ST (MISCELLANEOUS) IMPLANT
TRAY KYPHOPAK 20/3 EXPRESS 1ST (MISCELLANEOUS) ×2 IMPLANT

## 2016-08-19 NOTE — H&P (Signed)
Reviewed paper H+P, will be scanned into chart. No changes noted.  

## 2016-08-19 NOTE — Anesthesia Procedure Notes (Signed)
Procedure Name: MAC Date/Time: 08/19/2016 2:35 PM Performed by: Allean Found Pre-anesthesia Checklist: Patient identified, Emergency Drugs available, Suction available, Patient being monitored and Timeout performed Patient Re-evaluated:Patient Re-evaluated prior to inductionOxygen Delivery Method: Nasal cannula Placement Confirmation: positive ETCO2 Dental Injury: Teeth and Oropharynx as per pre-operative assessment

## 2016-08-19 NOTE — Transfer of Care (Signed)
Immediate Anesthesia Transfer of Care Note  Patient: Heather Singh  Procedure(s) Performed: Procedure(s): KYPHOPLASTY L3,L4 (N/A)  Patient Location: PACU  Anesthesia Type:MAC  Level of Consciousness: awake, alert  and oriented  Airway & Oxygen Therapy: Patient Spontanous Breathing and Patient connected to nasal cannula oxygen  Post-op Assessment: Report given to RN and Post -op Vital signs reviewed and stable  Post vital signs: Reviewed and stable  Last Vitals:  Vitals:   08/19/16 1218 08/19/16 1520  BP: (!) 145/76 123/67  Pulse: 72 88  Resp: 16 16  Temp: 36.3 C 36.7 C    Last Pain:  Vitals:   08/19/16 1520  TempSrc: Temporal  PainSc: 0-No pain         Complications: No apparent anesthesia complications

## 2016-08-19 NOTE — Op Note (Signed)
08/19/2016  3:23 PM  PATIENT:  Heather Singh  71 y.o. female  PRE-OPERATIVE DIAGNOSIS:  CLOSED COMPRESSION FRACTURE OF L3 LUMBAR VERTEBRA, CLOSED COMPRESSION OF L4 LUMBAR VERTEBRA  POST-OPERATIVE DIAGNOSIS:  CLOSED COMPRESSION FRACTURE OF L3 LUMBAR VERTEBRA, CLOSED COMPRESSION OF L4 LUMBAR VERTEBRA  PROCEDURE:  Procedure(s): KYPHOPLASTY L3,L4 (N/A)  SURGEON: Laurene Footman, MD  ASSISTANTS: None  ANESTHESIA:   local and MAC  EBL:  Total I/O In: 700 [I.V.:700] Out: -   BLOOD ADMINISTERED:none  DRAINS: none   LOCAL MEDICATIONS USED:  MARCAINE    and XYLOCAINE   SPECIMEN:  Source of Specimen:  L3 and L4 vertebral body biopsies  DISPOSITION OF SPECIMEN:  PATHOLOGY  COUNTS:  YES  TOURNIQUET:  * No tourniquets in log *  IMPLANTS: Bone cement  DICTATION: .Dragon Dictation  patient brought the operating room and after adequate sedation was given the patient was placed prone and C-arm brought in and with excellent visualization of L3 and 4. After patient identification and timeout procedure completed 5 cc 1% Xylocaine was infiltrated on the right at L4 on the left at L3. Next the back was prepped and draped in sterile manner and repeat timeout procedure carried out. Spinal needle was used to get local anesthetic down to the pedicle on the right  side of L4 with 10 cc of half percent Sensorcaine with epinephrine and 10 cc of Xylocaine on each side and the same amount at L3 on the left side. A small incision was made on the right side and a trocar advanced in an extrapedicular fashion at L4, biopsy obtained. The drilling was carried out followed by inflation of a balloon and essentially across the midline so second insertion site was not needed with about 4.5 cc inflation there is also mild correction of the kyphotic deformity. Identical procedure carried out at L3 on the left side with inflation to about 2-1/2 cc with less correction of the deformity The cement was then mixed and  inserted when it was the appropriate consistency with 5.5 of bone cement filling the L4 vertebral body getting very good interdigitation and coverage from superior to inferior medial right to left sides and 3 cc at L3. When the cement was set the trochars removed and permanent C-arm views obtained. Dermabond were used to close the skin followed by a Band-Aid  PLAN OF CARE: Discharge to home after PACU  PATIENT DISPOSITION:  PACU - hemodynamically stable.

## 2016-08-19 NOTE — Discharge Instructions (Addendum)
Take it easy today, tomorrow do activities as tolerated. Remove Band-Aid's on Saturday and okay to shower after that. Pain medicine as directed.  AMBULATORY SURGERY  DISCHARGE INSTRUCTIONS   1) The drugs that you were given will stay in your system until tomorrow so for the next 24 hours you should not:  A) Drive an automobile B) Make any legal decisions C) Drink any alcoholic beverage   2) You may resume regular meals tomorrow.  Today it is better to start with liquids and gradually work up to solid foods.  You may eat anything you prefer, but it is better to start with liquids, then soup and crackers, and gradually work up to solid foods.   3) Please notify your doctor immediately if you have any unusual bleeding, trouble breathing, redness and pain at the surgery site, drainage, fever, or pain not relieved by medication.    4) Additional Instructions:        Please contact your physician with any problems or Same Day Surgery at [phone], Monday through Friday 6 am to 4 pm, or [place] [facility] number at [phone].

## 2016-08-19 NOTE — Anesthesia Preprocedure Evaluation (Signed)
Anesthesia Evaluation  Patient identified by MRN, date of birth, ID band Patient awake    Reviewed: Allergy & Precautions, H&P , NPO status , Patient's Chart, lab work & pertinent test results, reviewed documented beta blocker date and time   History of Anesthesia Complications (+) PONV  Airway Mallampati: III  TM Distance: >3 FB Neck ROM: full    Dental no notable dental hx. (+) Teeth Intact   Pulmonary neg pulmonary ROS,    Pulmonary exam normal breath sounds clear to auscultation       Cardiovascular Exercise Tolerance: Good hypertension, negative cardio ROS   Rhythm:regular Rate:Normal     Neuro/Psych  Neuromuscular disease negative neurological ROS  negative psych ROS   GI/Hepatic negative GI ROS, Neg liver ROS, hiatal hernia, GERD  Medicated,  Endo/Other  negative endocrine ROSdiabetesHypothyroidism   Renal/GU      Musculoskeletal   Abdominal   Peds  Hematology negative hematology ROS (+)   Anesthesia Other Findings   Reproductive/Obstetrics negative OB ROS                             Anesthesia Physical Anesthesia Plan  ASA: III  Anesthesia Plan: MAC   Post-op Pain Management:    Induction:   PONV Risk Score and Plan: 4 or greater and Ondansetron, Dexamethasone, Propofol, Midazolam, Scopolamine patch - Pre-op and Treatment may vary due to age  Airway Management Planned:   Additional Equipment:   Intra-op Plan:   Post-operative Plan:   Informed Consent: I have reviewed the patients History and Physical, chart, labs and discussed the procedure including the risks, benefits and alternatives for the proposed anesthesia with the patient or authorized representative who has indicated his/her understanding and acceptance.     Plan Discussed with: CRNA  Anesthesia Plan Comments:         Anesthesia Quick Evaluation

## 2016-08-19 NOTE — Anesthesia Post-op Follow-up Note (Cosign Needed)
Anesthesia QCDR form completed.        

## 2016-08-20 ENCOUNTER — Encounter: Payer: Self-pay | Admitting: Orthopedic Surgery

## 2016-08-20 NOTE — Anesthesia Postprocedure Evaluation (Signed)
Anesthesia Post Note  Patient: Heather Singh  Procedure(s) Performed: Procedure(s) (LRB): KYPHOPLASTY L3,L4 (N/A)  Patient location during evaluation: PACU Anesthesia Type: MAC Level of consciousness: awake and alert Pain management: pain level controlled Vital Signs Assessment: post-procedure vital signs reviewed and stable Respiratory status: spontaneous breathing, nonlabored ventilation, respiratory function stable and patient connected to nasal cannula oxygen Cardiovascular status: blood pressure returned to baseline and stable Postop Assessment: no signs of nausea or vomiting Anesthetic complications: no     Last Vitals:  Vitals:   08/19/16 1606 08/19/16 1635  BP: (!) 148/65 (!) 147/66  Pulse: 72 70  Resp: 16 16  Temp: 36.6 C 36.7 C    Last Pain:  Vitals:   08/20/16 0820  TempSrc:   PainSc: [place] Akela Pocius

## 2016-08-23 ENCOUNTER — Encounter: Payer: Self-pay | Admitting: Family Medicine

## 2016-08-23 DIAGNOSIS — S32030S Wedge compression fracture of third lumbar vertebra, sequela: Secondary | ICD-10-CM | POA: Insufficient documentation

## 2016-08-23 LAB — SURGICAL PATHOLOGY

## 2016-09-11 ENCOUNTER — Emergency Department: Payer: Federal, State, Local not specified - PPO

## 2016-09-11 ENCOUNTER — Emergency Department
Admission: EM | Admit: 2016-09-11 | Discharge: 2016-09-11 | Disposition: A | Discharge status: Home / Self Care | Payer: Federal, State, Local not specified - PPO | Attending: Emergency Medicine | Admitting: Emergency Medicine

## 2016-09-11 DIAGNOSIS — Y93B9 Activity, other involving muscle strengthening exercises: Secondary | ICD-10-CM | POA: Insufficient documentation

## 2016-09-11 DIAGNOSIS — S32010A Wedge compression fracture of first lumbar vertebra, initial encounter for closed fracture: Secondary | ICD-10-CM | POA: Diagnosis not present

## 2016-09-11 DIAGNOSIS — Y929 Unspecified place or not applicable: Secondary | ICD-10-CM | POA: Diagnosis not present

## 2016-09-11 DIAGNOSIS — X500XXA Overexertion from strenuous movement or load, initial encounter: Secondary | ICD-10-CM | POA: Insufficient documentation

## 2016-09-11 DIAGNOSIS — Z79899 Other long term (current) drug therapy: Secondary | ICD-10-CM | POA: Diagnosis not present

## 2016-09-11 DIAGNOSIS — R9431 Abnormal electrocardiogram [ECG] [EKG]: Secondary | ICD-10-CM | POA: Diagnosis not present

## 2016-09-11 DIAGNOSIS — Y999 Unspecified external cause status: Secondary | ICD-10-CM | POA: Insufficient documentation

## 2016-09-11 DIAGNOSIS — R55 Syncope and collapse: Secondary | ICD-10-CM | POA: Diagnosis present

## 2016-09-11 LAB — BASIC METABOLIC PANEL
Anion gap: 8 (ref 5–15)
BUN: 13 mg/dL (ref 6–20)
CALCIUM: 10.1 mg/dL (ref 8.9–10.3)
CO2: 28 mmol/L (ref 22–32)
CREATININE: 0.7 mg/dL (ref 0.44–1.00)
Chloride: 102 mmol/L (ref 101–111)
GFR calc non Af Amer: >60 mL/min (ref >60)
Glucose, Bld: 102 mg/dL — ABNORMAL HIGH (ref 65–99)
Potassium: 4 mmol/L (ref 3.5–5.1)
SODIUM: 138 mmol/L (ref 135–145)

## 2016-09-11 LAB — URINALYSIS, COMPLETE (UACMP) WITH MICROSCOPIC
Bacteria, UA: NONE SEEN
Bilirubin Urine: NEGATIVE
GLUCOSE, UA: NEGATIVE mg/dL
HGB URINE DIPSTICK: NEGATIVE
Ketones, ur: NEGATIVE mg/dL
Nitrite: NEGATIVE
PH: 8 (ref 5.0–8.0)
Protein, ur: NEGATIVE mg/dL
SPECIFIC GRAVITY, URINE: 1.008 (ref 1.005–1.030)

## 2016-09-11 LAB — CBC
HCT: 37 % (ref 35.0–47.0)
Hemoglobin: 12.9 g/dL (ref 12.0–16.0)
MCH: 32.9 pg (ref 26.0–34.0)
MCHC: 34.8 g/dL (ref 32.0–36.0)
MCV: 94.6 fL (ref 80.0–100.0)
PLATELETS: 259 10*3/uL (ref 150–440)
RBC: 3.92 MIL/uL (ref 3.80–5.20)
RDW: 12.9 % (ref 11.5–14.5)
WBC: 4.4 10*3/uL (ref 3.6–11.0)

## 2016-09-11 MED ORDER — HYDROCODONE-ACETAMINOPHEN 5-325 MG PO TABS: 1.0000 | ORAL_TABLET | Freq: Four times a day (QID) | ORAL | 0 refills | Status: DC | PRN

## 2016-09-11 MED ORDER — HYDROCODONE-ACETAMINOPHEN 5-325 MG PO TABS
1.0000 | ORAL_TABLET | Freq: Once | ORAL | Status: AC
  Administered 2016-09-11: 1 via ORAL
  Filled 2016-09-11: qty 1

## 2016-09-11 NOTE — ED Triage Notes (Signed)
Per EMS pt had back surgery two weeks ago and began to experience intense pain this morning.  Pt was on the floor when EMS arrived.  Pt states she passed out and that her husband helped her to the floor.  Pt states 10/10 pain in lower back with movement.  Pt denies dizziness on arrival.

## 2016-09-11 NOTE — ED Provider Notes (Signed)
[facility] Emergency Department Provider Note ____________________________________________   I have reviewed the triage vital signs and the triage nursing note.  HISTORY  Chief Complaint Loss of Consciousness and Back Pain   Historian Patient  HPI Heather Singh is a 71 y.o. female with khyphoplasty to 2 vertebral compression fractures 3 weeks ago with Dr. Rudene Christians, presents with acute severe back pain in the same location, mid lumbar this morning. States that she was stretching and felt a pop and then had severe back pain that felt similar to when she had the compression fractures initially. Patient states that then she immediately got hot and her husband reports she went limp and had passed out. She came back quickly but has continued to have pain in the back. Pain is 1010 and worse with any movements.  Denies chest pain, abdominal pain, fevers, weakness, or numbness.  She had been doing quite well in fact only use Tylenol after kyphoplasty.    Past Medical History:  Diagnosis Date  . Allergy   . Back pain   . Dyslipidemia   . Fibrocystic breast disease   . GERD (gastroesophageal reflux disease)   . Menopause   . Osteoporosis   . Reflux esophagitis   . RLS (restless legs syndrome)   . Urinary dysfunction     Patient Active Problem List   Diagnosis Date Noted  . Closed compression fracture of L3 lumbar vertebra, sequela 08/23/2016  . Atrophic vaginitis 06/05/2015  . Acquired hypothyroidism 10/19/2014  . Perennial allergic rhinitis 10/19/2014  . B12 deficiency 10/19/2014  . Fibrocystic breast disease 10/19/2014  . Gastro-esophageal reflux disease without esophagitis 10/19/2014  . OP (osteoporosis) 10/19/2014  . Ovarian failure 10/19/2014  . Hiatal hernia 10/19/2014  . Restless legs syndrome 10/19/2014  . ABNORMAL EKG 09/03/2009  . Dyslipidemia 08/23/2008  . Vitamin D deficiency 10/10/2007    Past Surgical History:  Procedure Laterality Date   . BREAST BIOPSY Left   . BREAST CYST ASPIRATION Left   . CATARACT EXTRACTION    . EYE SURGERY     left  . KYPHOPLASTY N/A 08/19/2016   Procedure: KYPHOPLASTY L3,L4;  Surgeon: Hessie Knows, MD;  Location: [facility] ORS;  Service: Orthopedics;  Laterality: N/A;  . MOUTH SURGERY    . POLYPECTOMY    . SLING BLADE PROCEDURE    . TOTAL ABDOMINAL HYSTERECTOMY      Prior to Admission medications   Medication Sig Start Date End Date Taking? Authorizing Provider  calcium carbonate (TUMS EX) 750 MG chewable tablet Chew 1 tablet by mouth daily.    [provider]  calcium carbonate (TUMS EX) 750 MG chewable tablet Chew 1 tablet by mouth daily.    [provider]  fluticasone (FLONASE) 50 MCG/ACT nasal spray Place 2 sprays into both nostrils daily as needed for allergies or rhinitis.    [provider]  HYDROcodone-acetaminophen (NORCO/VICODIN) 5-325 MG tablet Take 1 tablet by mouth every 6 (six) hours as needed for moderate pain. 09/11/16   Lisa Roca, MD  levothyroxine (SYNTHROID, LEVOTHROID) 25 MCG tablet Take 1-2 tablets (25-50 mcg total) by mouth daily. Alternate one and two pills 03/30/16   Steele Sizer, MD  loratadine (CLARITIN) 10 MG tablet Take 1 tablet by mouth daily.    [provider]  omeprazole (PRILOSEC) 40 MG capsule Take 40 mg by mouth every morning.    [provider]  pravastatin (PRAVACHOL) 20 MG tablet TAKE 1 TABLET(20 MG) BY MOUTH DAILY 03/30/16  Steele Sizer, MD    Allergies  Allergen Reactions  . Alendronate     dypspepsia  . Azithromycin   . Levofloxacin   . Nitrofurantoin Monohyd Macro   . Penicillins   . Sulfamethoxazole-Trimethoprim   . Etodolac Hives    Family History  Problem Relation Age of Onset  . Transient ischemic attack Mother   . Atrial fibrillation Mother   . Arthritis Mother        OA  . Heart disease Father        CHF  . CAD Father   . Hyperlipidemia Sister   . Arthritis Sister        OA  .  Drug abuse Son   . Hypertension Son   . Stroke Sister   . Arthritis Sister        OA  . Heart failure Other   . Coronary artery disease Other   . Stroke Other   . Hyperlipidemia Other   . Hypertension Other   . Thyroid disease Other   . Breast cancer Maternal Aunt   . Breast cancer Paternal Aunt     Social History Social History  Substance Use Topics  . Smoking status: Never Smoker  . Smokeless tobacco: Never Used  . Alcohol use No    Review of Systems  Constitutional: Negative for fever. Eyes: Negative for visual changes. ENT: Negative for sore throat. Cardiovascular: Negative for chest pain. Respiratory: Negative for shortness of breath. Gastrointestinal: Negative for abdominal pain, vomiting and diarrhea. Genitourinary: Negative for dysuria. Musculoskeletal: Positive for back pain. Skin: Negative for rash. Neurological: Negative for headache.  ____________________________________________   PHYSICAL EXAM:  VITAL SIGNS: ED Triage Vitals  Enc Vitals Group     BP --      Pulse Rate 09/11/16 0901 68     Resp --      Temp 09/11/16 0901 97.9 F (36.6 C)     Temp Source 09/11/16 0901 Oral     SpO2 09/11/16 0901 100 %     Weight 09/11/16 0901 107 lb (48.5 kg)     Height 09/11/16 0901 5\' 2"  (1.575 m)     Head Circumference --      Peak Flow --      Pain Score 09/11/16 0900 10     Pain Loc --      Pain Edu? --      Excl. in [place]? --      Constitutional: Alert and oriented. Well appearing and in no distress. HEENT   Head: Normocephalic and atraumatic.      Eyes: Conjunctivae are normal. Pupils equal and round.       Ears:         Nose: No congestion/rhinnorhea.   Mouth/Throat: Mucous membranes are moist.   Neck: No stridor. Cardiovascular/Chest: Normal rate, regular rhythm.  No murmurs, rubs, or gallops. Respiratory: Normal respiratory effort without tachypnea nor retractions. Breath sounds are clear and equal bilaterally. No  wheezes/rales/rhonchi. Gastrointestinal: Soft. No distention, no guarding, no rebound. Nontender.   Genitourinary/rectal:Deferred Musculoskeletal: Diffuse tenderness in the mid lumbar area without focal point tenderness or step off. No palpable muscle spasm. Nontender with normal range of motion in all extremities. No joint effusions.  No lower extremity tenderness.  No edema. Neurologic:  Normal speech and language. No gross or focal neurologic deficits are appreciated. Skin:  Skin is warm, dry and intact. No rash noted. Psychiatric: Mood and affect are normal. Speech and behavior are normal. Patient exhibits appropriate  insight and judgment.   ____________________________________________  LABS (pertinent positives/negatives)  Labs Reviewed  BASIC METABOLIC PANEL - Abnormal; Notable for the following:       Result Value   Glucose, Bld 102 (*)    All other components within normal limits  URINALYSIS, COMPLETE (UACMP) WITH MICROSCOPIC - Abnormal; Notable for the following:    Color, Urine STRAW (*)    APPearance CLEAR (*)    Leukocytes, UA SMALL (*)    Squamous Epithelial / LPF 0-5 (*)    All other components within normal limits  CBC  CBG MONITORING, ED    ____________________________________________    EKG I, Lisa Roca, MD, the attending physician have personally viewed and interpreted all ECGs.  68 bpm. Normal sinus rhythm. Narrow QRS. Normal axis. Normal ST and T-wave ____________________________________________  RADIOLOGY All Xrays were viewed by me. Imaging interpreted by Radiologist.  Lumbar spine x-rays:  IMPRESSION: 1. Mild loss of height of the L1 vertebra suggesting a mild compression fracture, new since the previous lumbar MRI. 2. No other evidence of an acute abnormality. 3. Stable changes from kyphoplasty at the fractured L3 and L4 vertebra. __________________________________________  PROCEDURES  Procedure(s) performed: None  Critical Care  performed: None  ____________________________________________   ED COURSE / ASSESSMENT AND PLAN  Pertinent labs & imaging results that were available during my care of the patient were reviewed by me and considered in my medical decision making (see chart for details).   Ms. Neal is overall well-appearing, reports acute low back pain in the same place where she had compression fractures and kyphoplasty 3 weeks ago, which occurred when she was stretching this morning. It was followed quickly after by what sounds like likely vasovagal syncope after pain. No additional high risk concerning factors in terms of the syncope. No chest pain or neurologic complaints.  EKG is reassuring.  Xray shows L1 compression, mild, but new from prior MRI.  I discussed with patient, spouse and family.  Patient with some adequate pain relief Norco.  Will dc with Norco.  She can follow up with her ortho Dr. Rudene Christians.     CONSULTATIONS:   None   Patient / Family / Caregiver informed of clinical course, medical decision-making process, and agree with plan.   I discussed return precautions, follow-up instructions, and discharge instructions with patient and/or family.  Discharge Instructions : You were found to have L1 compression fracture.  Return to the ER for any worsening or uncontrolled pain, weakness, numbness, chest pain, or passing out.  Please follow up with your primary care doctor as well on account of passing out -- although as we discussed your exam and evaluation are reassuring and I suspect passing out was related to acute pain response.  ___________________________________________   FINAL CLINICAL IMPRESSION(S) / ED DIAGNOSES   Final diagnoses:  Closed wedge compression fracture of first lumbar vertebra, initial encounter [facility])              Note: This dictation was prepared with Dragon dictation. Any transcriptional errors that result from this process are unintentional     Lisa Roca, MD 09/11/16 1154

## 2016-09-11 NOTE — Discharge Instructions (Signed)
You were found to have L1 compression fracture.  Return to the ER for any worsening or uncontrolled pain, weakness, numbness, chest pain, or passing out.  Please follow up with your primary care doctor as well on account of passing out -- although as we discussed your exam and evaluation are reassuring and I suspect passing out was related to acute pain response.

## 2016-09-16 ENCOUNTER — Other Ambulatory Visit: Payer: Self-pay | Admitting: Orthopedic Surgery

## 2016-09-16 ENCOUNTER — Ambulatory Visit
Admission: RE | Admit: 2016-09-16 | Discharge: 2016-09-16 | Disposition: A | Discharge status: Home / Self Care | Payer: Federal, State, Local not specified - PPO | Source: Ambulatory Visit | Attending: Orthopedic Surgery | Admitting: Orthopedic Surgery

## 2016-09-16 DIAGNOSIS — S32010A Wedge compression fracture of first lumbar vertebra, initial encounter for closed fracture: Secondary | ICD-10-CM | POA: Diagnosis present

## 2016-09-16 DIAGNOSIS — M5136 Other intervertebral disc degeneration, lumbar region: Secondary | ICD-10-CM | POA: Diagnosis not present

## 2016-09-16 DIAGNOSIS — M47816 Spondylosis without myelopathy or radiculopathy, lumbar region: Secondary | ICD-10-CM | POA: Diagnosis not present

## 2016-09-16 DIAGNOSIS — X58XXXA Exposure to other specified factors, initial encounter: Secondary | ICD-10-CM | POA: Diagnosis not present

## 2016-09-21 ENCOUNTER — Ambulatory Visit: Payer: Federal, State, Local not specified - PPO | Admitting: Anesthesiology

## 2016-09-21 ENCOUNTER — Ambulatory Visit
Admission: RE | Admit: 2016-09-21 | Discharge: 2016-09-21 | Disposition: A | Discharge status: Home / Self Care | Payer: Federal, State, Local not specified - PPO | Source: Ambulatory Visit | Attending: Orthopedic Surgery | Admitting: Orthopedic Surgery

## 2016-09-21 ENCOUNTER — Encounter: Payer: Self-pay | Admitting: *Deleted

## 2016-09-21 ENCOUNTER — Encounter
Admission: RE | Disposition: A | Discharge status: Home / Self Care | Payer: Self-pay | Source: Ambulatory Visit | Attending: Orthopedic Surgery

## 2016-09-21 ENCOUNTER — Ambulatory Visit: Payer: Federal, State, Local not specified - PPO

## 2016-09-21 DIAGNOSIS — I10 Essential (primary) hypertension: Secondary | ICD-10-CM | POA: Diagnosis not present

## 2016-09-21 DIAGNOSIS — K449 Diaphragmatic hernia without obstruction or gangrene: Secondary | ICD-10-CM | POA: Diagnosis not present

## 2016-09-21 DIAGNOSIS — E039 Hypothyroidism, unspecified: Secondary | ICD-10-CM | POA: Diagnosis not present

## 2016-09-21 DIAGNOSIS — Z79899 Other long term (current) drug therapy: Secondary | ICD-10-CM | POA: Diagnosis not present

## 2016-09-21 DIAGNOSIS — M4856XA Collapsed vertebra, not elsewhere classified, lumbar region, initial encounter for fracture: Secondary | ICD-10-CM | POA: Diagnosis not present

## 2016-09-21 DIAGNOSIS — E119 Type 2 diabetes mellitus without complications: Secondary | ICD-10-CM | POA: Diagnosis not present

## 2016-09-21 DIAGNOSIS — K219 Gastro-esophageal reflux disease without esophagitis: Secondary | ICD-10-CM | POA: Insufficient documentation

## 2016-09-21 DIAGNOSIS — Z419 Encounter for procedure for purposes other than remedying health state, unspecified: Secondary | ICD-10-CM

## 2016-09-21 HISTORY — PX: KYPHOPLASTY: SHX5884

## 2016-09-21 SURGERY — KYPHOPLASTY
Anesthesia: Monitor Anesthesia Care

## 2016-09-21 MED ORDER — KETAMINE HCL 50 MG/ML IJ SOLN
INTRAMUSCULAR | Status: AC
  Filled 2016-09-21: qty 10

## 2016-09-21 MED ORDER — BUPIVACAINE-EPINEPHRINE (PF) 0.5% -1:200000 IJ SOLN
INTRAMUSCULAR | Status: DC | PRN
  Administered 2016-09-21: 20 mL

## 2016-09-21 MED ORDER — LIDOCAINE HCL 1 % IJ SOLN
INTRAMUSCULAR | Status: DC | PRN
  Administered 2016-09-21: 20 mL

## 2016-09-21 MED ORDER — FENTANYL CITRATE (PF) 100 MCG/2ML IJ SOLN: 25.0000 ug | INTRAMUSCULAR | Status: DC | PRN

## 2016-09-21 MED ORDER — ONDANSETRON HCL 4 MG/2ML IJ SOLN: 4.0000 mg | Freq: Once | INTRAMUSCULAR | Status: DC | PRN

## 2016-09-21 MED ORDER — BUPIVACAINE-EPINEPHRINE (PF) 0.5% -1:200000 IJ SOLN
INTRAMUSCULAR | Status: AC
  Filled 2016-09-21: qty 30

## 2016-09-21 MED ORDER — LACTATED RINGERS IV SOLN
INTRAVENOUS | Status: DC
  Administered 2016-09-21: 17:00:00 via INTRAVENOUS

## 2016-09-21 MED ORDER — PROPOFOL 10 MG/ML IV BOLUS
INTRAVENOUS | Status: AC
  Filled 2016-09-21: qty 20

## 2016-09-21 MED ORDER — METOCLOPRAMIDE HCL 5 MG/ML IJ SOLN: 5.0000 mg | Freq: Three times a day (TID) | INTRAMUSCULAR | Status: DC | PRN

## 2016-09-21 MED ORDER — SODIUM CHLORIDE 0.9 % IV SOLN: INTRAVENOUS | Status: DC

## 2016-09-21 MED ORDER — MIDAZOLAM HCL 2 MG/2ML IJ SOLN
INTRAMUSCULAR | Status: AC
Start: 2016-09-21 — End: 2016-09-21
  Filled 2016-09-21: qty 2

## 2016-09-21 MED ORDER — LIDOCAINE HCL (PF) 2 % IJ SOLN
INTRAMUSCULAR | Status: AC
  Filled 2016-09-21: qty 2

## 2016-09-21 MED ORDER — ONDANSETRON HCL 4 MG PO TABS: 4.0000 mg | ORAL_TABLET | Freq: Four times a day (QID) | ORAL | Status: DC | PRN

## 2016-09-21 MED ORDER — MIDAZOLAM HCL 2 MG/2ML IJ SOLN
INTRAMUSCULAR | Status: DC | PRN
Start: 2016-09-21 — End: 2016-09-21
  Administered 2016-09-21: 0.5 mg via INTRAVENOUS
  Administered 2016-09-21: 1 mg via INTRAVENOUS

## 2016-09-21 MED ORDER — HYDROCODONE-ACETAMINOPHEN 5-325 MG PO TABS: 1.0000 | ORAL_TABLET | ORAL | Status: DC | PRN

## 2016-09-21 MED ORDER — METOCLOPRAMIDE HCL 10 MG PO TABS: 5.0000 mg | ORAL_TABLET | Freq: Three times a day (TID) | ORAL | Status: DC | PRN

## 2016-09-21 MED ORDER — PROPOFOL 500 MG/50ML IV EMUL
INTRAVENOUS | Status: DC | PRN
  Administered 2016-09-21: 25 ug/kg/min via INTRAVENOUS

## 2016-09-21 MED ORDER — KETAMINE HCL 50 MG/ML IJ SOLN
INTRAMUSCULAR | Status: DC | PRN
  Administered 2016-09-21: 20 mg via INTRAMUSCULAR

## 2016-09-21 MED ORDER — CLINDAMYCIN PHOSPHATE 900 MG/50ML IV SOLN
900.0000 mg | Freq: Once | INTRAVENOUS | Status: AC
  Administered 2016-09-21: 900 mg via INTRAVENOUS

## 2016-09-21 MED ORDER — ONDANSETRON HCL 4 MG/2ML IJ SOLN: 4.0000 mg | Freq: Four times a day (QID) | INTRAMUSCULAR | Status: DC | PRN

## 2016-09-21 MED ORDER — IOPAMIDOL (ISOVUE-M 200) INJECTION 41%
INTRAMUSCULAR | Status: AC
  Filled 2016-09-21: qty 20

## 2016-09-21 MED ORDER — LIDOCAINE HCL (PF) 1 % IJ SOLN
INTRAMUSCULAR | Status: AC
  Filled 2016-09-21: qty 60

## 2016-09-21 MED ORDER — FENTANYL CITRATE (PF) 100 MCG/2ML IJ SOLN
INTRAMUSCULAR | Status: AC
  Filled 2016-09-21: qty 2

## 2016-09-21 MED ORDER — CLINDAMYCIN PHOSPHATE 900 MG/50ML IV SOLN
INTRAVENOUS | Status: AC
  Filled 2016-09-21: qty 50

## 2016-09-21 MED ORDER — PROPOFOL 500 MG/50ML IV EMUL
INTRAVENOUS | Status: AC
  Filled 2016-09-21: qty 50

## 2016-09-21 SURGICAL SUPPLY — 17 items
CEMENT BONE KYPHON CDS (Cement) ×2 IMPLANT
CEMENT KYPHON CX01A KIT/MIXER (Cement) ×2 IMPLANT
DERMABOND ADVANCED (GAUZE/BANDAGES/DRESSINGS) ×1
DERMABOND ADVANCED .7 DNX12 (GAUZE/BANDAGES/DRESSINGS) ×1 IMPLANT
DEVICE BIOPSY BONE KYPHX (INSTRUMENTS) ×2 IMPLANT
DRAPE C-ARM XRAY 36X54 (DRAPES) ×2 IMPLANT
DURAPREP 26ML APPLICATOR (WOUND CARE) ×2 IMPLANT
GLOVE SURG SYN 9.0  PF PI (GLOVE) ×1
GLOVE SURG SYN 9.0 PF PI (GLOVE) ×1 IMPLANT
GOWN SRG 2XL LVL 4 RGLN SLV (GOWNS) ×1 IMPLANT
GOWN STRL NON-REIN 2XL LVL4 (GOWNS) ×1
GOWN STRL REUS W/ TWL LRG LVL3 (GOWN DISPOSABLE) ×1 IMPLANT
GOWN STRL REUS W/TWL LRG LVL3 (GOWN DISPOSABLE) ×1
PACK KYPHOPLASTY (MISCELLANEOUS) ×2 IMPLANT
STRAP SAFETY BODY (MISCELLANEOUS) IMPLANT
TRAY KYPHOPAK 15/3 EXPRESS 1ST (MISCELLANEOUS) ×2 IMPLANT
TRAY KYPHOPAK 20/3 EXPRESS 1ST (MISCELLANEOUS) IMPLANT

## 2016-09-21 NOTE — Anesthesia Preprocedure Evaluation (Deleted)
Anesthesia Evaluation  Patient identified by MRN, date of birth, ID band Patient awake    Reviewed: Allergy & Precautions, NPO status , Patient's Chart, lab work & pertinent test results  Airway Mallampati: II  TM Distance: >3 FB     Dental no notable dental hx.    Pulmonary neg pulmonary ROS,    Pulmonary exam normal        Cardiovascular negative cardio ROS Normal cardiovascular exam     Neuro/Psych negative neurological ROS  negative psych ROS   GI/Hepatic Neg liver ROS, hiatal hernia, GERD  Medicated,  Endo/Other  Hypothyroidism   Renal/GU negative Renal ROS  negative genitourinary   Musculoskeletal negative musculoskeletal ROS (+)   Abdominal Normal abdominal exam  (+)   Peds negative pediatric ROS (+)  Hematology negative hematology ROS (+)   Anesthesia Other Findings   Reproductive/Obstetrics                           Anesthesia Physical Anesthesia Plan  ASA: II  Anesthesia Plan: General   Post-op Pain Management:    Induction: Intravenous  PONV Risk Score and Plan:   Airway Management Planned: Nasal Cannula  Additional Equipment:   Intra-op Plan:   Post-operative Plan:   Informed Consent: I have reviewed the patients History and Physical, chart, labs and discussed the procedure including the risks, benefits and alternatives for the proposed anesthesia with the patient or authorized representative who has indicated his/her understanding and acceptance.   Dental advisory given  Plan Discussed with: CRNA and Surgeon  Anesthesia Plan Comments:         Anesthesia Quick Evaluation                                   Anesthesia Evaluation  Patient identified by MRN, date of birth, ID band Patient awake    Reviewed: Allergy & Precautions, H&P , NPO status , Patient's Chart, lab work & pertinent test results, reviewed documented beta blocker date and time    History of Anesthesia Complications (+) PONV  Airway Mallampati: III  TM Distance: >3 FB Neck ROM: full    Dental no notable dental hx. (+) Teeth Intact   Pulmonary neg pulmonary ROS,    Pulmonary exam normal breath sounds clear to auscultation       Cardiovascular Exercise Tolerance: Good hypertension, negative cardio ROS   Rhythm:regular Rate:Normal     Neuro/Psych  Neuromuscular disease negative neurological ROS  negative psych ROS   GI/Hepatic negative GI ROS, Neg liver ROS, hiatal hernia, GERD  Medicated,  Endo/Other  negative endocrine ROSdiabetesHypothyroidism   Renal/GU      Musculoskeletal   Abdominal   Peds  Hematology negative hematology ROS (+)   Anesthesia Other Findings   Reproductive/Obstetrics negative OB ROS                             Anesthesia Physical Anesthesia Plan  ASA: III  Anesthesia Plan: MAC   Post-op Pain Management:    Induction:   PONV Risk Score and Plan: 4 or greater and Ondansetron, Dexamethasone, Propofol, Midazolam, Scopolamine patch - Pre-op and Treatment may vary due to age  Airway Management Planned:   Additional Equipment:   Intra-op Plan:   Post-operative Plan:   Informed Consent: I have reviewed the patients History and Physical, chart,  labs and discussed the procedure including the risks, benefits and alternatives for the proposed anesthesia with the patient or authorized representative who has indicated his/her understanding and acceptance.     Plan Discussed with: CRNA  Anesthesia Plan Comments:         Anesthesia Quick Evaluation                                   Anesthesia Evaluation  Patient identified by MRN, date of birth, ID band Patient awake    Reviewed: Allergy & Precautions, H&P , NPO status , Patient's Chart, lab work & pertinent test results, reviewed documented beta blocker date and time   History of Anesthesia Complications (+)  PONV  Airway Mallampati: III  TM Distance: >3 FB Neck ROM: full    Dental no notable dental hx. (+) Teeth Intact   Pulmonary neg pulmonary ROS,    Pulmonary exam normal breath sounds clear to auscultation       Cardiovascular Exercise Tolerance: Good hypertension, negative cardio ROS   Rhythm:regular Rate:Normal     Neuro/Psych  Neuromuscular disease negative neurological ROS  negative psych ROS   GI/Hepatic negative GI ROS, Neg liver ROS, hiatal hernia, GERD  Medicated,  Endo/Other  negative endocrine ROSdiabetesHypothyroidism   Renal/GU      Musculoskeletal   Abdominal   Peds  Hematology negative hematology ROS (+)   Anesthesia Other Findings   Reproductive/Obstetrics negative OB ROS                             Anesthesia Physical Anesthesia Plan  ASA: III  Anesthesia Plan: MAC   Post-op Pain Management:    Induction:   PONV Risk Score and Plan: 4 or greater and Ondansetron, Dexamethasone, Propofol, Midazolam, Scopolamine patch - Pre-op and Treatment may vary due to age  Airway Management Planned:   Additional Equipment:   Intra-op Plan:   Post-operative Plan:   Informed Consent: I have reviewed the patients History and Physical, chart, labs and discussed the procedure including the risks, benefits and alternatives for the proposed anesthesia with the patient or authorized representative who has indicated his/her understanding and acceptance.     Plan Discussed with: CRNA  Anesthesia Plan Comments:         Anesthesia Quick Evaluation

## 2016-09-21 NOTE — Discharge Instructions (Addendum)
AMBULATORY SURGERY  DISCHARGE INSTRUCTIONS   1) The drugs that you were given will stay in your system until tomorrow so for the next 24 hours you should not:  A) Drive an automobile B) Make any legal decisions C) Drink any alcoholic beverage   2) You may resume regular meals tomorrow.  Today it is better to start with liquids and gradually work up to solid foods.  You may eat anything you prefer, but it is better to start with liquids, then soup and crackers, and gradually work up to solid foods.   3) Please notify your doctor immediately if you have any unusual bleeding, trouble breathing, redness and pain at the surgery site, drainage, fever, or pain not relieved by medication. 4)   5) Your post-operative visit with Dr.                                     is: Date:                        Time:    Please call to schedule your post-operative visit.  6) Additional Instructions:        Remove Band-Aid on Thursday. Try to take it easy tonight and tomorrow and Thursday resume normal more normal activities. Pain medicine as needed call office if you need a refill. Okay to shower after removing Band-Aid

## 2016-09-21 NOTE — H&P (Signed)
Reviewed paper H+P, will be scanned into chart. No changes noted.  

## 2016-09-21 NOTE — Anesthesia Postprocedure Evaluation (Signed)
Anesthesia Post Note  Patient: Heather Singh  Procedure(s) Performed: Procedure(s) (LRB): KYPHOPLASTY-L1 (N/A)  Patient location during evaluation: PACU Anesthesia Type: MAC Level of consciousness: awake and alert Pain management: pain level controlled Vital Signs Assessment: post-procedure vital signs reviewed and stable Respiratory status: spontaneous breathing, nonlabored ventilation, respiratory function stable and patient connected to nasal cannula oxygen Cardiovascular status: stable and blood pressure returned to baseline Anesthetic complications: no     Last Vitals:  Vitals:   09/21/16 1848 09/21/16 1918  BP: (!) 151/62 140/72  Pulse: 80 77  Resp: 14 16  Temp: 36.8 C 36.8 C    Last Pain:  Vitals:   09/21/16 1918  TempSrc: Temporal  PainSc: 0-No pain                 Rande Dario S

## 2016-09-21 NOTE — Anesthesia Preprocedure Evaluation (Signed)
Anesthesia Evaluation  Patient identified by MRN, date of birth, ID band Patient awake    Reviewed: Allergy & Precautions, H&P , NPO status , Patient's Chart, lab work & pertinent test results, reviewed documented beta blocker date and time   History of Anesthesia Complications (+) PONV  Airway Mallampati: II  TM Distance: >3 FB Neck ROM: full    Dental no notable dental hx. (+) Teeth Intact   Pulmonary neg pulmonary ROS,    Pulmonary exam normal breath sounds clear to auscultation       Cardiovascular Exercise Tolerance: Good hypertension, negative cardio ROS   Rhythm:regular Rate:Normal     Neuro/Psych  Neuromuscular disease negative neurological ROS  negative psych ROS   GI/Hepatic negative GI ROS, Neg liver ROS, hiatal hernia, GERD  Medicated,  Endo/Other  negative endocrine ROSdiabetesHypothyroidism   Renal/GU      Musculoskeletal   Abdominal   Peds  Hematology negative hematology ROS (+)   Anesthesia Other Findings   Reproductive/Obstetrics negative OB ROS                             Anesthesia Physical  Anesthesia Plan  ASA: III  Anesthesia Plan: MAC   Post-op Pain Management:    Induction: Intravenous  PONV Risk Score and Plan: 4 or greater and Ondansetron, Dexamethasone, Propofol, Midazolam, Scopolamine patch - Pre-op and Treatment may vary due to age  Airway Management Planned: Nasal Cannula  Additional Equipment:   Intra-op Plan:   Post-operative Plan:   Informed Consent: I have reviewed the patients History and Physical, chart, labs and discussed the procedure including the risks, benefits and alternatives for the proposed anesthesia with the patient or authorized representative who has indicated his/her understanding and acceptance.     Plan Discussed with: CRNA  Anesthesia Plan Comments:         Anesthesia Quick Evaluation

## 2016-09-21 NOTE — Anesthesia Post-op Follow-up Note (Cosign Needed)
Anesthesia QCDR form completed.        

## 2016-09-21 NOTE — Op Note (Signed)
09/21/2016  5:56 PM  PATIENT:  Heather Singh  71 y.o. female  PRE-OPERATIVE DIAGNOSIS:  CLOSED COMPRESSION FRACTURE L1  POST-OPERATIVE DIAGNOSIS:  CLOSED COMPRESSION FRACTURE L1  PROCEDURE:  Procedure(s): KYPHOPLASTY-L1 (N/A)  SURGEON: Laurene Footman, MD  ASSISTANTS: None  ANESTHESIA:   local and MAC  EBL:  Total I/O In: 400 [I.V.:400] Out: 5 [Blood:5]  BLOOD ADMINISTERED:none  DRAINS: none   LOCAL MEDICATIONS USED:  MARCAINE    and XYLOCAINE   SPECIMEN:  No Specimen  DISPOSITION OF SPECIMEN:  N/A  COUNTS:  YES  TOURNIQUET:  * No tourniquets in log *  IMPLANTS: Bone cement  DICTATION: .Dragon Dictation  patient brought the operating room and after adequate sedation was given the patient was placed prone and C-arm brought in and with excellent visualization of L1. After patient identification and timeout procedure completed 10cc 1% Xylocaine was infiltrated on the right sideside. Next the back was prepped and draped in sterile manner and repeat timeout procedure carried out. Spinal needle was used to get local anesthetic down to the pedicle on the right side with 20 cc of half percent Sensorcaine with epinephrine and 20 cc of Xylocaine on the right side. A small incision was made on the right side and a trocar advanced in an extrapedicular fashion, biopsyobtained of the L1 body. The drilling was carried out followed by inflation of a balloon and essentially across the midline so second insertion site was not needed with about 5.0cc inflation there is also somecorrection of the kyphotic deformity. The cement was then mixed and inserted when it was the appropriate consistency with 5.5cc of bone cement filling the vertebral body getting very good interdigitation and coverage from superior to inferior medial right to left sides.  When the cement was set the trochar removed and permanent C-arm views obtained. Dermabond were used to close the skin followed by a  Band-Aid  PLAN OF CARE: Discharge to home after PACU  PATIENT DISPOSITION:  PACU - hemodynamically stable.

## 2016-09-21 NOTE — Transfer of Care (Signed)
Immediate Anesthesia Transfer of Care Note  Patient: Heather Singh  Procedure(s) Performed: Procedure(s): KYPHOPLASTY-L1 (N/A)  Patient Location: PACU  Anesthesia Type:General  Level of Consciousness: awake and patient cooperative  Airway & Oxygen Therapy: Patient Spontanous Breathing  Post-op Assessment: Report given to RN and Post -op Vital signs reviewed and stable  Post vital signs: Reviewed and stable  Last Vitals:  Vitals:   09/21/16 1452 09/21/16 1757  BP: 132/83 124/67  Pulse: 80 85  Resp: 18 18  Temp: 36.8 C 36.8 C    Last Pain:  Vitals:   09/21/16 1452  TempSrc: Oral  PainSc: 5          Complications: No apparent anesthesia complications

## 2016-09-22 ENCOUNTER — Encounter: Payer: Self-pay | Admitting: Orthopedic Surgery

## 2016-09-24 ENCOUNTER — Other Ambulatory Visit: Payer: Self-pay | Admitting: Family Medicine

## 2016-09-24 DIAGNOSIS — E785 Hyperlipidemia, unspecified: Secondary | ICD-10-CM

## 2016-09-24 NOTE — Telephone Encounter (Signed)
Patient requesting refill of Pravastatin to [facility].

## 2016-09-27 ENCOUNTER — Ambulatory Visit: Payer: Federal, State, Local not specified - PPO | Admitting: Family Medicine

## 2016-10-11 ENCOUNTER — Ambulatory Visit
Admission: RE | Admit: 2016-10-11 | Discharge: 2016-10-11 | Disposition: A | Discharge status: Home / Self Care | Payer: Federal, State, Local not specified - PPO | Source: Ambulatory Visit | Attending: Family Medicine | Admitting: Family Medicine

## 2016-10-11 ENCOUNTER — Encounter: Payer: Self-pay | Admitting: Family Medicine

## 2016-10-11 ENCOUNTER — Ambulatory Visit (INDEPENDENT_AMBULATORY_CARE_PROVIDER_SITE_OTHER): Payer: Federal, State, Local not specified - PPO | Admitting: Family Medicine

## 2016-10-11 VITALS — BP 120/70 | HR 82 | Temp 98.9°F | Resp 16 | Ht 62.0 in | Wt 104.6 lb

## 2016-10-11 DIAGNOSIS — E785 Hyperlipidemia, unspecified: Secondary | ICD-10-CM | POA: Diagnosis not present

## 2016-10-11 DIAGNOSIS — M952 Other acquired deformity of head: Secondary | ICD-10-CM | POA: Insufficient documentation

## 2016-10-11 DIAGNOSIS — R5383 Other fatigue: Secondary | ICD-10-CM

## 2016-10-11 DIAGNOSIS — M810 Age-related osteoporosis without current pathological fracture: Secondary | ICD-10-CM | POA: Diagnosis not present

## 2016-10-11 DIAGNOSIS — E559 Vitamin D deficiency, unspecified: Secondary | ICD-10-CM | POA: Diagnosis not present

## 2016-10-11 DIAGNOSIS — E538 Deficiency of other specified B group vitamins: Secondary | ICD-10-CM | POA: Diagnosis not present

## 2016-10-11 DIAGNOSIS — E039 Hypothyroidism, unspecified: Secondary | ICD-10-CM

## 2016-10-11 MED ORDER — PRAVASTATIN SODIUM 20 MG PO TABS: 20.0000 mg | ORAL_TABLET | Freq: Every day | ORAL | 1 refills | Status: DC

## 2016-10-11 NOTE — Progress Notes (Signed)
Name: Heather Singh   MRN: [id number]    DOB: 25-Feb-1946   Date:10/11/2016       Progress Note  Subjective  Chief Complaint  Chief Complaint  Patient presents with  . Follow-up    6 month recheck  . Hyperlipidemia  . Hypothyroidism  . Soft spot    in top of head for 2 months    HPI  Pt here for 6 month follow up:  Spinal Fractures/Osteoporosis:  Has osteoporosis and is seeing endocrinologist at the end of August; Sees Dr. Rudene Christians with Ortho - Had 1st Kyphoplasty 08/19/16 to fix L3/L4, Patient then had 2nd Kyphoplasty on 09/21/16 to fix a new L1 fracture (See ER notes for details).  No longer taking Vitamin D supplementation. She is now willing to start therapy   GERD: Has been seeing Dr. Tami Ribas who Rx'd Omeprazole '40mg'$ . Says symptoms have been well controlled at this dose. She would like to have her Omeprazole filled through our office from now on and see GI only as needed.  Fatigue:  Says the surgeries both went well over the last 2 months, but she has had a lot of difficulty getting her energy back. She had been on pain medication prior to the surgery and is off completely now. We will check labs today.  No moodiness, crying spells, or angry outbursts  Skull Deformity: Noticed rights-sided skull deformity about 3 months ago - describes it as a "dent" she has never had this before. Non-tender, no pain, no headaches, vision changes, confusion, speech changes, weakness, or memory loss.  Hypothyroidism: Having fatigue, lost 4lbs since 09/11/16 but was in and out of the hospital and has been trying to be more deliberate in her diet.  She denies dysphagia. She is compliant with medication, last TSH was at goal. She has dry skin. No brittle nails or hair breaking/falling out.   Hyperlipidemia: Taking Pravachol. No side effects from medications, no joint/muscle aches. No chest pain or SOB.  We will recheck labs tomorrow - she is not fasting today.  Patient Active Problem List   Diagnosis Date  Noted  . Closed compression fracture of L3 lumbar vertebra, sequela 08/23/2016  . Atrophic vaginitis 06/05/2015  . Acquired hypothyroidism 10/19/2014  . Perennial allergic rhinitis 10/19/2014  . B12 deficiency 10/19/2014  . Fibrocystic breast disease 10/19/2014  . Gastro-esophageal reflux disease without esophagitis 10/19/2014  . OP (osteoporosis) 10/19/2014  . Ovarian failure 10/19/2014  . Hiatal hernia 10/19/2014  . Restless legs syndrome 10/19/2014  . ABNORMAL EKG 09/03/2009  . Dyslipidemia 08/23/2008  . Vitamin D deficiency 10/10/2007    Past Surgical History:  Procedure Laterality Date  . BREAST BIOPSY Left   . BREAST CYST ASPIRATION Left   . CATARACT EXTRACTION    . EYE SURGERY     left  . KYPHOPLASTY N/A 08/19/2016   Procedure: KYPHOPLASTY L3,L4;  Surgeon: Hessie Knows, MD;  Location: [facility] ORS;  Service: Orthopedics;  Laterality: N/A;  . KYPHOPLASTY N/A 09/21/2016   Procedure: [id number];  Surgeon: Hessie Knows, MD;  Location: [facility] ORS;  Service: Orthopedics;  Laterality: N/A;  . MOUTH SURGERY    . POLYPECTOMY    . SLING BLADE PROCEDURE    . TOTAL ABDOMINAL HYSTERECTOMY      Family History  Problem Relation Age of Onset  . Transient ischemic attack Mother   . Atrial fibrillation Mother   . Arthritis Mother        OA  . Heart disease Father  CHF  . CAD Father   . Hyperlipidemia Sister   . Arthritis Sister        OA  . Drug abuse Son   . Hypertension Son   . Stroke Sister   . Arthritis Sister        OA  . Heart failure Other   . Coronary artery disease Other   . Stroke Other   . Hyperlipidemia Other   . Hypertension Other   . Thyroid disease Other   . Breast cancer Maternal Aunt   . Breast cancer Paternal Aunt     Social History   Social History  . Marital status: Married    Spouse name: N/A  . Number of children: N/A  . Years of education: N/A   Occupational History  . Not on file.   Social History Main Topics  . Smoking status:  Never Smoker  . Smokeless tobacco: Never Used  . Alcohol use No  . Drug use: No  . Sexual activity: Yes    Partners: Male   Other Topics Concern  . Not on file   Social History Narrative   Married   Gets regular exercise     Current Outpatient Prescriptions:  .  calcium carbonate (TUMS EX) 750 MG chewable tablet, Chew 1 tablet by mouth daily., Disp: , Rfl:  .  loratadine (CLARITIN) 10 MG tablet, Take 1 tablet by mouth daily., Disp: , Rfl:  .  omeprazole (PRILOSEC) 40 MG capsule, Take 40 mg by mouth every morning., Disp: , Rfl:  .  Polyethylene Glycol 400 (BLINK TEARS) 0.25 % SOLN, Place 1 drop into both eyes 2 (two) times daily as needed (dry eyes)., Disp: , Rfl:  .  polyethylene glycol powder (GLYCOLAX/MIRALAX) powder, Take 17 g by mouth daily., Disp: , Rfl: 0 .  pravastatin (PRAVACHOL) 20 MG tablet, TAKE 1 TABLET(20 MG) BY MOUTH DAILY, Disp: 30 tablet, Rfl: 0 .  HYDROcodone-acetaminophen (NORCO/VICODIN) 5-325 MG tablet, Take 1 tablet by mouth every 6 (six) hours as needed for moderate pain. (Patient not taking: Reported on 10/11/2016), Disp: 10 tablet, Rfl: 0 .  levothyroxine (SYNTHROID, LEVOTHROID) 25 MCG tablet, Take 1-2 tablets (25-50 mcg total) by mouth daily. Alternate one and two pills (Patient taking differently: Take 25-50 mcg by mouth daily before breakfast. Alternate one and two pills), Disp: 45 tablet, Rfl: 5  Current Facility-Administered Medications:  .  betamethasone acetate-betamethasone sodium phosphate (CELESTONE) injection 3 mg, 3 mg, Intramuscular, Once, Edrick Kins, DPM  Allergies  Allergen Reactions  . Alendronate     Dyspepsia, inflamed esophagus   . Azithromycin Hives  . Levofloxacin Hives  . Nitrofurantoin Monohyd Macro Hives  . Sulfamethoxazole-Trimethoprim Hives  . Etodolac Hives  . Penicillins Hives and Rash    Has patient had a PCN reaction causing immediate rash, facial/tongue/throat swelling, SOB or lightheadedness with hypotension: No Has  patient had a PCN reaction causing severe rash involving mucus membranes or skin necrosis: No Has patient had a PCN reaction that required hospitalization: No Has patient had a PCN reaction occurring within the last 10 years: No If all of the above answers are "NO", then may proceed with Cephalosporin use.      ROS  Constitutional: Negative for fever, positive for weight change and fatigue.  Respiratory: Negative for cough and shortness of breath.   Cardiovascular: Negative for chest pain or palpitations.  Gastrointestinal: Negative for abdominal pain, no bowel changes.  Musculoskeletal: Negative for gait problem or joint swelling.  Skin:  Negative for rash.  Neurological: Negative for dizziness or headache.  No other specific complaints in a complete review of systems (except as listed in HPI above).  Objective  Vitals:   10/11/16 1030  BP: 120/70  Pulse: 82  Resp: 16  Temp: 98.9 F (37.2 C)  TempSrc: Oral  SpO2: 96%  Weight: 104 lb 9.6 oz (47.4 kg)  Height: '5\' 2"'$  (1.575 m)   Body mass index is 19.13 kg/m.  Physical Exam   Constitutional: Patient appears well-developed and well-nourished. No distress.  HENT: Head: 2 inch diameter right parietal skull indentation palpated and atraumatic. Ears: B TMs ok, no erythema or effusion; Nose: Nose normal. Mouth/Throat: Oropharynx is clear and moist. No oropharyngeal exudate.  Eyes: Conjunctivae and EOM are normal. Pupils are equal, round, and reactive to light. No scleral icterus.  Neck: Normal range of motion. Neck supple. No JVD present. No thyromegaly present.  Cardiovascular: Normal rate, regular rhythm and normal heart sounds.  No murmur heard. No BLE edema. Pulmonary/Chest: Effort normal and breath sounds normal. No respiratory distress. Abdominal: Soft. Bowel sounds are normal, no distension. There is no tenderness. no masses Musculoskeletal: she has a large dent on the right parietal area, no tenderness to palpation, no  pain during palpation of spinal processes  Neurological: she is alert and oriented to person, place, and time. No cranial nerve deficit. Coordination, balance, strength, speech and gait are normal.  Skin: Skin is warm and dry. No rash noted. No erythema.  Psychiatric: Patient has a normal mood and affect. behavior is normal. Judgment and thought content normal.  Recent Results (from the past 2160 hour(s))  Surgical pcr screen     Status: Abnormal   Collection Time: 08/16/16  2:44 PM  Result Value Ref Range   MRSA, PCR NEGATIVE NEGATIVE   Staphylococcus aureus POSITIVE (A) NEGATIVE    Comment:        The Xpert SA Assay (FDA approved for NASAL specimens in patients over 31 years of age), is one component of a comprehensive surveillance program.  Test performance has been validated by [facility] for patients greater than or equal to 77 year old. It is not intended to diagnose infection nor to guide or monitor treatment.   Surgical pathology     Status: None   Collection Time: 08/19/16  2:40 PM  Result Value Ref Range   SURGICAL PATHOLOGY      Surgical Pathology CASE: [id number] PATIENT: Osborne Oman Surgical Pathology Report     SPECIMEN SUBMITTED: A. L3 bone; biopsy B. L4 bone; biopsy  CLINICAL HISTORY: None provided  PRE-OPERATIVE DIAGNOSIS: Closed compression fracture of L3 lumbar vertebra, closed compression of L4 lumbar vertebra  POST-OPERATIVE DIAGNOSIS: Same as pre-op     DIAGNOSIS: A. BONE, L3; BIOPSY: - BONE AND MARROW STROMA WITH HEMORRHAGE AND REACTIVE CHANGES, COMPATIBLE WITH PROVIDED HISTORY OF FRACTURE. - NEGATIVE FOR MALIGNANCY.  B. BONE, L4; BIOPSY: - BONE AND MARROW STROMA WITH HEMORRHAGE AND REACTIVE CHANGES, COMPATIBLE WITH PROVIDED HISTORY OF FRACTURE. - NEGATIVE FOR MALIGNANCY.   GROSS DESCRIPTION: A. Labeled: L3 bone biopsy  Tissue fragment(s): Multiple  Size: 1.3 x 0.4 x 0.3 cm  Description: Fragments of pink red bone  and blood clot  Entirely submitted in one cassette(s), after decalcification.   B. Labeled: L4 bone biopsy  Tissue fragment(s): Mul tiple  Size: 1.3 x 0.5 x 0.3 cm  Description: Fragments of pink red bone and blood clot  Entirely submitted in one cassette(s) after decalcification.  Final Diagnosis performed by Quay Burow, MD.  Electronically signed 08/23/2016 9:43:06AM    The electronic signature indicates that the named Attending Pathologist has evaluated the specimen  Technical component performed at [facility], [address], [place], [place] [address] Lab: [phone] Dir: Darrick Penna. Evette Doffing, MD  Professional component performed at [facility], [facility], [place], [place], [place] [address] Lab: [phone] Dir: Dellia Nims. Rubinas, MD    Basic metabolic panel     Status: Abnormal   Collection Time: 09/11/16  9:01 AM  Result Value Ref Range   Sodium 138 135 - 145 mmol/L   Potassium 4.0 3.5 - 5.1 mmol/L   Chloride 102 101 - 111 mmol/L   CO2 28 22 - 32 mmol/L   Glucose, Bld 102 (H) 65 - 99 mg/dL   BUN 13 6 - 20 mg/dL   Creatinine, Ser 0.70 0.44 - 1.00 mg/dL   Calcium 10.1 8.9 - 10.3 mg/dL   GFR calc non Af Amer >60 >60 mL/min   GFR calc Af Amer >60 >60 mL/min    Comment: (NOTE) The eGFR has been calculated using the CKD EPI equation. This calculation has not been validated in all clinical situations. eGFR's persistently <60 mL/min signify possible Chronic Kidney Disease.    Anion gap 8 5 - 15  CBC     Status: None   Collection Time: 09/11/16  9:01 AM  Result Value Ref Range   WBC 4.4 3.6 - 11.0 K/uL   RBC 3.92 3.80 - 5.20 MIL/uL   Hemoglobin 12.9 12.0 - 16.0 g/dL   HCT 37.0 35.0 - 47.0 %   MCV 94.6 80.0 - 100.0 fL   MCH 32.9 26.0 - 34.0 pg   MCHC 34.8 32.0 - 36.0 g/dL   RDW 12.9 11.5 - 14.5 %   Platelets 259 150 - 440 K/uL  Urinalysis, Complete w Microscopic     Status: Abnormal   Collection Time: 09/11/16  9:01 AM  Result  Value Ref Range   Color, Urine STRAW (A) YELLOW   APPearance CLEAR (A) CLEAR   Specific Gravity, Urine 1.008 1.005 - 1.030   pH 8.0 5.0 - 8.0   Glucose, UA NEGATIVE NEGATIVE mg/dL   Hgb urine dipstick NEGATIVE NEGATIVE   Bilirubin Urine NEGATIVE NEGATIVE   Ketones, ur NEGATIVE NEGATIVE mg/dL   Protein, ur NEGATIVE NEGATIVE mg/dL   Nitrite NEGATIVE NEGATIVE   Leukocytes, UA SMALL (A) NEGATIVE   RBC / HPF 0-5 0 - 5 RBC/hpf   WBC, UA 0-5 0 - 5 WBC/hpf   Bacteria, UA NONE SEEN NONE SEEN   Squamous Epithelial / LPF 0-5 (A) NONE SEEN   PHQ2/9: Depression screen [facility] 2/9 03/30/2016 10/22/2015 04/24/2015 10/21/2014  Decreased Interest 0 0 0 0  Down, Depressed, Hopeless 0 0 0 0  PHQ - 2 Score 0 0 0 0    Fall Risk: Fall Risk  03/30/2016 10/22/2015 04/24/2015 10/21/2014  Falls in the past year? No No No No   Assessment & Plan  1. Age-related osteoporosis without current pathological fracture - VITAMIN D 25 Hydroxy (Vit-D Deficiency, Fractures) - Parathyroid hormone, intact (no Ca)  2. Acquired hypothyroidism - TSH  3. B12 deficiency - Vitamin B12  4. Vitamin D deficiency - VITAMIN D 25 Hydroxy (Vit-D Deficiency, Fractures)  5. Dyslipidemia - Lipid panel - pravastatin (PRAVACHOL) 20 MG tablet; Take 1 tablet (20 mg total) by mouth daily.  Dispense: 90 tablet; Refill: 1  6. Fatigue, unspecified type - VITAMIN D  25 Hydroxy (Vit-D Deficiency, Fractures) - Vitamin B12 - CBC w/Diff/Platelet - Comprehensive metabolic panel - Parathyroid hormone, intact (no Ca)  7. Skull deformity - DG Skull 1-3 Views; Future - Parathyroid hormone, intact (no Ca)  Return in about 6 weeks (around 11/22/2016).   I have reviewed this encounter including the documentation in this note and/or discussed this patient with the Johney Maine, FNP, NP-C. I am certifying that I agree with the content of this note as supervising physician.  Steele Sizer, MD [facility] 10/11/2016, 4:47 PM

## 2016-10-13 LAB — COMPREHENSIVE METABOLIC PANEL
ALT: 23 IU/L (ref 0–32)
AST: 30 IU/L (ref 0–40)
Albumin/Globulin Ratio: 2 (ref 1.2–2.2)
Albumin: 4.3 g/dL (ref 3.5–4.8)
Alkaline Phosphatase: 102 IU/L (ref 39–117)
BILIRUBIN TOTAL: 0.3 mg/dL (ref 0.0–1.2)
BUN/Creatinine Ratio: 19 (ref 12–28)
BUN: 14 mg/dL (ref 8–27)
CALCIUM: 9.8 mg/dL (ref 8.7–10.3)
CHLORIDE: 98 mmol/L (ref 96–106)
CO2: 26 mmol/L (ref 20–29)
Creatinine, Ser: 0.73 mg/dL (ref 0.57–1.00)
GFR calc non Af Amer: 82 mL/min/{1.73_m2} (ref >59)
GFR, EST AFRICAN AMERICAN: 94 mL/min/{1.73_m2} (ref >59)
GLUCOSE: 82 mg/dL (ref 65–99)
Globulin, Total: 2.1 g/dL (ref 1.5–4.5)
Potassium: 4.3 mmol/L (ref 3.5–5.2)
Sodium: 139 mmol/L (ref 134–144)
TOTAL PROTEIN: 6.4 g/dL (ref 6.0–8.5)

## 2016-10-13 LAB — CBC WITH DIFFERENTIAL/PLATELET
BASOS ABS: 0 10*3/uL (ref 0.0–0.2)
Basos: 0 %
EOS (ABSOLUTE): 0.1 10*3/uL (ref 0.0–0.4)
Eos: 2 %
Hematocrit: 33.2 % — ABNORMAL LOW (ref 34.0–46.6)
Hemoglobin: 11.3 g/dL (ref 11.1–15.9)
IMMATURE GRANS (ABS): 0 10*3/uL (ref 0.0–0.1)
IMMATURE GRANULOCYTES: 0 %
LYMPHS: 39 %
Lymphocytes Absolute: 1.4 10*3/uL (ref 0.7–3.1)
MCH: 32.8 pg (ref 26.6–33.0)
MCHC: 34 g/dL (ref 31.5–35.7)
MCV: 97 fL (ref 79–97)
Monocytes Absolute: 0.3 10*3/uL (ref 0.1–0.9)
Monocytes: 8 %
NEUTROS PCT: 51 %
Neutrophils Absolute: 1.9 10*3/uL (ref 1.4–7.0)
PLATELETS: 236 10*3/uL (ref 150–379)
RBC: 3.44 x10E6/uL — ABNORMAL LOW (ref 3.77–5.28)
RDW: 12.8 % (ref 12.3–15.4)
WBC: 3.7 10*3/uL (ref 3.4–10.8)

## 2016-10-13 LAB — LIPID PANEL
Chol/HDL Ratio: 2.7 ratio (ref 0.0–4.4)
Cholesterol, Total: 192 mg/dL (ref 100–199)
HDL: 70 mg/dL (ref >39)
LDL Calculated: 107 mg/dL — ABNORMAL HIGH (ref 0–99)
Triglycerides: 77 mg/dL (ref 0–149)
VLDL Cholesterol Cal: 15 mg/dL (ref 5–40)

## 2016-10-13 LAB — TSH: TSH: 1.49 u[IU]/mL (ref 0.450–4.500)

## 2016-10-13 LAB — VITAMIN B12: VITAMIN B 12: 318 pg/mL (ref 232–1245)

## 2016-10-13 LAB — PARATHYROID HORMONE, INTACT (NO CA): PTH: 31 pg/mL (ref 15–65)

## 2016-10-13 LAB — VITAMIN D 25 HYDROXY (VIT D DEFICIENCY, FRACTURES): VIT D 25 HYDROXY: 44.7 ng/mL (ref 30.0–100.0)

## 2016-11-05 ENCOUNTER — Other Ambulatory Visit: Payer: Self-pay | Admitting: Internal Medicine

## 2016-11-05 DIAGNOSIS — M810 Age-related osteoporosis without current pathological fracture: Secondary | ICD-10-CM

## 2016-11-09 ENCOUNTER — Ambulatory Visit
Admission: RE | Admit: 2016-11-09 | Discharge: 2016-11-09 | Disposition: A | Discharge status: Home / Self Care | Payer: Federal, State, Local not specified - PPO | Source: Ambulatory Visit | Attending: Internal Medicine | Admitting: Internal Medicine

## 2016-11-09 DIAGNOSIS — M810 Age-related osteoporosis without current pathological fracture: Secondary | ICD-10-CM | POA: Diagnosis present

## 2016-11-17 ENCOUNTER — Encounter: Payer: Self-pay | Admitting: Family Medicine

## 2016-11-17 ENCOUNTER — Ambulatory Visit (INDEPENDENT_AMBULATORY_CARE_PROVIDER_SITE_OTHER): Payer: Federal, State, Local not specified - PPO | Admitting: Family Medicine

## 2016-11-17 VITALS — BP 130/74 | HR 83 | Temp 98.1°F | Resp 16 | Ht 62.0 in | Wt 105.5 lb

## 2016-11-17 DIAGNOSIS — S32030S Wedge compression fracture of third lumbar vertebra, sequela: Secondary | ICD-10-CM

## 2016-11-17 DIAGNOSIS — M25532 Pain in left wrist: Secondary | ICD-10-CM

## 2016-11-17 DIAGNOSIS — N6320 Unspecified lump in the left breast, unspecified quadrant: Secondary | ICD-10-CM

## 2016-11-17 DIAGNOSIS — M952 Other acquired deformity of head: Secondary | ICD-10-CM

## 2016-11-17 DIAGNOSIS — M810 Age-related osteoporosis without current pathological fracture: Secondary | ICD-10-CM

## 2016-11-17 DIAGNOSIS — Z1231 Encounter for screening mammogram for malignant neoplasm of breast: Secondary | ICD-10-CM

## 2016-11-17 DIAGNOSIS — N6325 Unspecified lump in the left breast, overlapping quadrants: Secondary | ICD-10-CM

## 2016-11-17 DIAGNOSIS — Z23 Encounter for immunization: Secondary | ICD-10-CM | POA: Diagnosis not present

## 2016-11-17 NOTE — Progress Notes (Signed)
Name: Heather Singh   MRN: [id number]    DOB: [birth year]   Date:11/17/2016       Progress Note  Subjective  Chief Complaint  Chief Complaint  Patient presents with  . Follow-up    6 week F/U  . Osteoporosis    Went to see Dr. Gabriel Carina and had another Bone Density performed, awaiting results    HPI  Osteoporosis: she has been seeing Dr. Gabriel Carina, [facility] ordered but she has not started it yet, bone density showed worsening since 2016. X-ray skull was negative, discussed CT recommended by radiologist but she states since I am not concerned she will hold off on it. Not tender during palpation. History of compression fracture  Left wrist pain: she has noticed tenderness and swelling on dorsal aspect of left wrist, usually in the afternoons and if more active ( like driving), worse when wearing her watch. Not tender to palpation when not swollen. Going on for the past 4-6 weeks. Not getting any worse. Discussed biofreeze, and brace, normal exam  Breast pain: she noticed left breast tenderness over the past week. No redness or nipple discharge, except for some redness when she removed a skin tag from the area.    Patient Active Problem List   Diagnosis Date Noted  . Closed compression fracture of L3 lumbar vertebra, sequela 08/23/2016  . Atrophic vaginitis 06/05/2015  . Acquired hypothyroidism 10/19/2014  . Perennial allergic rhinitis 10/19/2014  . B12 deficiency 10/19/2014  . Fibrocystic breast disease 10/19/2014  . Gastro-esophageal reflux disease without esophagitis 10/19/2014  . OP (osteoporosis) 10/19/2014  . Ovarian failure 10/19/2014  . Hiatal hernia 10/19/2014  . Restless legs syndrome 10/19/2014  . ABNORMAL EKG 09/03/2009  . Dyslipidemia 08/23/2008  . Vitamin D deficiency 10/10/2007    Past Surgical History:  Procedure Laterality Date  . BREAST BIOPSY Left   . BREAST CYST ASPIRATION Left   . CATARACT EXTRACTION    . EYE SURGERY     left  . KYPHOPLASTY N/A 08/19/2016   Procedure: KYPHOPLASTY L3,L4;  Surgeon: Hessie Knows, MD;  Location: [facility] ORS;  Service: Orthopedics;  Laterality: N/A;  . KYPHOPLASTY N/A 09/21/2016   Procedure: [id number];  Surgeon: Hessie Knows, MD;  Location: [facility] ORS;  Service: Orthopedics;  Laterality: N/A;  . MOUTH SURGERY    . POLYPECTOMY    . SLING BLADE PROCEDURE    . TOTAL ABDOMINAL HYSTERECTOMY      Family History  Problem Relation Age of Onset  . Transient ischemic attack Mother   . Atrial fibrillation Mother   . Arthritis Mother        OA  . Heart disease Father        CHF  . CAD Father   . Hyperlipidemia Sister   . Arthritis Sister        OA  . Drug abuse Son   . Hypertension Son   . Stroke Sister   . Arthritis Sister        OA  . Heart failure Other   . Coronary artery disease Other   . Stroke Other   . Hyperlipidemia Other   . Hypertension Other   . Thyroid disease Other   . Breast cancer Maternal Aunt   . Breast cancer Paternal Aunt     Social History   Social History  . Marital status: Married    Spouse name: N/A  . Number of children: N/A  . Years of education: N/A   Occupational History  . Not  on file.   Social History Main Topics  . Smoking status: Never Smoker  . Smokeless tobacco: Never Used  . Alcohol use No  . Drug use: No  . Sexual activity: Yes    Partners: Male   Other Topics Concern  . Not on file   Social History Narrative   Married   Gets regular exercise     Current Outpatient Prescriptions:  .  calcium carbonate (TUMS EX) 750 MG chewable tablet, Chew 1 tablet by mouth daily., Disp: , Rfl:  .  Cholecalciferol (VITAMIN D) 2000 units CAPS, Take 1 capsule by mouth daily., Disp: , Rfl:  .  levothyroxine (SYNTHROID, LEVOTHROID) 25 MCG tablet, Take 1-2 tablets (25-50 mcg total) by mouth daily. Alternate one and two pills (Patient taking differently: Take 25-50 mcg by mouth daily before breakfast. Alternate one and two pills), Disp: 45 tablet, Rfl: 5 .  loratadine  (CLARITIN) 10 MG tablet, Take 1 tablet by mouth daily., Disp: , Rfl:  .  omeprazole (PRILOSEC) 40 MG capsule, Take 40 mg by mouth every morning., Disp: , Rfl:  .  Polyethylene Glycol 400 (BLINK TEARS) 0.25 % SOLN, Place 1 drop into both eyes 2 (two) times daily as needed (dry eyes)., Disp: , Rfl:  .  pravastatin (PRAVACHOL) 20 MG tablet, Take 1 tablet (20 mg total) by mouth daily., Disp: 90 tablet, Rfl: 1 .  vitamin B-12 (CYANOCOBALAMIN) 1000 MCG tablet, Take 1,000 mcg by mouth 3 (three) times a week., Disp: , Rfl:  .  Teriparatide, Recombinant, 600 MCG/2.4ML SOLN, Inject into the skin., Disp: , Rfl:   Allergies  Allergen Reactions  . Alendronate     Dyspepsia, inflamed esophagus   . Azithromycin Hives  . Levofloxacin Hives  . Nitrofurantoin Monohyd Macro Hives  . Sulfamethoxazole-Trimethoprim Hives  . Etodolac Hives  . Penicillins Hives and Rash    Has patient had a PCN reaction causing immediate rash, facial/tongue/throat swelling, SOB or lightheadedness with hypotension: No Has patient had a PCN reaction causing severe rash involving mucus membranes or skin necrosis: No Has patient had a PCN reaction that required hospitalization: No Has patient had a PCN reaction occurring within the last 10 years: No If all of the above answers are "NO", then may proceed with Cephalosporin use.      ROS  Ten systems reviewed and is negative except as mentioned in HPI    Objective  Vitals:   11/17/16 1059  BP: 130/74  Pulse: 83  Resp: 16  Temp: 98.1 F (36.7 C)  TempSrc: Oral  SpO2: 97%  Weight: 105 lb 8 oz (47.9 kg)  Height: 5' 2" (1.575 m)    Body mass index is 19.3 kg/m.  Physical Exam  Constitutional: Patient appears well-developed and well-nourished. No distress.  HEENT: head atraumatic, normocephalic, pupils equal and reactive to light, , neck supple, throat within normal limits Cardiovascular: Normal rate, regular rhythm and normal heart sounds.  No murmur heard. No BLE  edema. Pulmonary/Chest: Effort normal and breath sounds normal. No respiratory distress. Abdominal: Soft.  There is no tenderness. Psychiatric: Patient has a normal mood and affect. behavior is normal. Judgment and thought content normal. Breast: small pea size 2 inches below nipple at 6 o'clock position  Muscular Skeletal: normal left wrist exam  Recent Results (from the past 2160 hour(s))  Surgical pathology     Status: None   Collection Time: 08/19/16  2:40 PM  Result Value Ref Range   SURGICAL PATHOLOGY  Surgical Pathology CASE: [id number] PATIENT: Osborne Oman Surgical Pathology Report     SPECIMEN SUBMITTED: A. L3 bone; biopsy B. L4 bone; biopsy  CLINICAL HISTORY: None provided  PRE-OPERATIVE DIAGNOSIS: Closed compression fracture of L3 lumbar vertebra, closed compression of L4 lumbar vertebra  POST-OPERATIVE DIAGNOSIS: Same as pre-op     DIAGNOSIS: A. BONE, L3; BIOPSY: - BONE AND MARROW STROMA WITH HEMORRHAGE AND REACTIVE CHANGES, COMPATIBLE WITH PROVIDED HISTORY OF FRACTURE. - NEGATIVE FOR MALIGNANCY.  B. BONE, L4; BIOPSY: - BONE AND MARROW STROMA WITH HEMORRHAGE AND REACTIVE CHANGES, COMPATIBLE WITH PROVIDED HISTORY OF FRACTURE. - NEGATIVE FOR MALIGNANCY.   GROSS DESCRIPTION: A. Labeled: L3 bone biopsy  Tissue fragment(s): Multiple  Size: 1.3 x 0.4 x 0.3 cm  Description: Fragments of pink red bone and blood clot  Entirely submitted in one cassette(s), after decalcification.   B. Labeled: L4 bone biopsy  Tissue fragment(s): Mul tiple  Size: 1.3 x 0.5 x 0.3 cm  Description: Fragments of pink red bone and blood clot  Entirely submitted in one cassette(s) after decalcification.     Final Diagnosis performed by Quay Burow, MD.  Electronically signed 08/23/2016 9:43:06AM    The electronic signature indicates that the named Attending Pathologist has evaluated the specimen  Technical component performed at [facility], [address] [place], [place] [address] Lab: [phone] Dir: Darrick Penna. Evette Doffing, MD  Professional component performed at [facility], [facility], [place], [place], [place] [address] Lab: [phone] Dir: Dellia Nims. Rubinas, MD    Basic metabolic panel     Status: Abnormal   Collection Time: 09/11/16  9:01 AM  Result Value Ref Range   Sodium 138 135 - 145 mmol/L   Potassium 4.0 3.5 - 5.1 mmol/L   Chloride 102 101 - 111 mmol/L   CO2 28 22 - 32 mmol/L   Glucose, Bld 102 (H) 65 - 99 mg/dL   BUN 13 6 - 20 mg/dL   Creatinine, Ser 0.70 0.44 - 1.00 mg/dL   Calcium 10.1 8.9 - 10.3 mg/dL   GFR calc non Af Amer >60 >60 mL/min   GFR calc Af Amer >60 >60 mL/min    Comment: (NOTE) The eGFR has been calculated using the CKD EPI equation. This calculation has not been validated in all clinical situations. eGFR's persistently <60 mL/min signify possible Chronic Kidney Disease.    Anion gap 8 5 - 15  CBC     Status: None   Collection Time: 09/11/16  9:01 AM  Result Value Ref Range   WBC 4.4 3.6 - 11.0 K/uL   RBC 3.92 3.80 - 5.20 MIL/uL   Hemoglobin 12.9 12.0 - 16.0 g/dL   HCT 37.0 35.0 - 47.0 %   MCV 94.6 80.0 - 100.0 fL   MCH 32.9 26.0 - 34.0 pg   MCHC 34.8 32.0 - 36.0 g/dL   RDW 12.9 11.5 - 14.5 %   Platelets 259 150 - 440 K/uL  Urinalysis, Complete w Microscopic     Status: Abnormal   Collection Time: 09/11/16  9:01 AM  Result Value Ref Range   Color, Urine STRAW (A) YELLOW   APPearance CLEAR (A) CLEAR   Specific Gravity, Urine 1.008 1.005 - 1.030   pH 8.0 5.0 - 8.0   Glucose, UA NEGATIVE NEGATIVE mg/dL   Hgb urine dipstick NEGATIVE NEGATIVE   Bilirubin Urine NEGATIVE NEGATIVE   Ketones, ur NEGATIVE NEGATIVE mg/dL   Protein, ur NEGATIVE NEGATIVE mg/dL   Nitrite NEGATIVE NEGATIVE   Leukocytes,  UA SMALL (A) NEGATIVE   RBC / HPF 0-5 0 - 5 RBC/hpf   WBC, UA 0-5 0 - 5 WBC/hpf   Bacteria, UA NONE SEEN NONE SEEN   Squamous Epithelial / LPF 0-5 (A) NONE SEEN  TSH      Status: None   Collection Time: 10/12/16  8:55 AM  Result Value Ref Range   TSH 1.490 0.450 - 4.500 uIU/mL  Lipid panel     Status: Abnormal   Collection Time: 10/12/16  8:55 AM  Result Value Ref Range   Cholesterol, Total 192 100 - 199 mg/dL   Triglycerides 77 0 - 149 mg/dL   HDL 70 >39 mg/dL   VLDL Cholesterol Cal 15 5 - 40 mg/dL   LDL Calculated 107 (H) 0 - 99 mg/dL   Chol/HDL Ratio 2.7 0.0 - 4.4 ratio    Comment:                                   T. Chol/HDL Ratio                                             Men  Women                               1/2 Avg.Risk  3.4    3.3                                   Avg.Risk  5.0    4.4                                2X Avg.Risk  9.6    7.1                                3X Avg.Risk 23.4   11.0   VITAMIN D 25 Hydroxy (Vit-D Deficiency, Fractures)     Status: None   Collection Time: 10/12/16  8:55 AM  Result Value Ref Range   Vit D, 25-Hydroxy 44.7 30.0 - 100.0 ng/mL    Comment: Vitamin D deficiency has been defined by the [place] practice guideline as a level of serum 25-OH vitamin D less than 20 ng/mL (1,2). The Endocrine Society went on to further define vitamin D insufficiency as a level between 21 and 29 ng/mL (2). 1. IOM (Institute of Medicine). 2010. Dietary reference    intakes for calcium and D. [place]: The    [employer]. 2. Holick MF, Binkley Quebrada, Bischoff-Ferrari HA, et al.    Evaluation, treatment, and prevention of vitamin D    deficiency: an Endocrine Society clinical practice    guideline. JCEM. 2011 Jul; 96(7):1911-30.   Vitamin B12     Status: None   Collection Time: 10/12/16  8:55 AM  Result Value Ref Range   Vitamin B-12 318 232 - 1,245 pg/mL  CBC w/Diff/Platelet     Status: Abnormal   Collection Time: 10/12/16  8:55 AM  Result Value Ref Range   WBC 3.7 3.4 -  10.8 x10E3/uL   RBC 3.44 (L) 3.77 - 5.28 x10E6/uL   Hemoglobin 11.3 11.1 - 15.9 g/dL    Hematocrit 33.2 (L) 34.0 - 46.6 %   MCV 97 79 - 97 fL   MCH 32.8 26.6 - 33.0 pg   MCHC 34.0 31.5 - 35.7 g/dL   RDW 12.8 12.3 - 15.4 %   Platelets 236 150 - 379 x10E3/uL   Neutrophils 51 Not Estab. %   Lymphs 39 Not Estab. %   Monocytes 8 Not Estab. %   Eos 2 Not Estab. %   Basos 0 Not Estab. %   Neutrophils Absolute 1.9 1.4 - 7.0 x10E3/uL   Lymphocytes Absolute 1.4 0.7 - 3.1 x10E3/uL   Monocytes Absolute 0.3 0.1 - 0.9 x10E3/uL   EOS (ABSOLUTE) 0.1 0.0 - 0.4 x10E3/uL   Basophils Absolute 0.0 0.0 - 0.2 x10E3/uL   Immature Granulocytes 0 Not Estab. %   Immature Grans (Abs) 0.0 0.0 - 0.1 x10E3/uL  Comprehensive metabolic panel     Status: None   Collection Time: 10/12/16  8:55 AM  Result Value Ref Range   Glucose 82 65 - 99 mg/dL   BUN 14 8 - 27 mg/dL   Creatinine, Ser 0.73 0.57 - 1.00 mg/dL   GFR calc non Af Amer 82 >59 mL/min/1.73   GFR calc Af Amer 94 >59 mL/min/1.73   BUN/Creatinine Ratio 19 12 - 28   Sodium 139 134 - 144 mmol/L   Potassium 4.3 3.5 - 5.2 mmol/L   Chloride 98 96 - 106 mmol/L   CO2 26 20 - 29 mmol/L   Calcium 9.8 8.7 - 10.3 mg/dL   Total Protein 6.4 6.0 - 8.5 g/dL   Albumin 4.3 3.5 - 4.8 g/dL   Globulin, Total 2.1 1.5 - 4.5 g/dL   Albumin/Globulin Ratio 2.0 1.2 - 2.2   Bilirubin Total 0.3 0.0 - 1.2 mg/dL   Alkaline Phosphatase 102 39 - 117 IU/L   AST 30 0 - 40 IU/L   ALT 23 0 - 32 IU/L  Parathyroid hormone, intact (no Ca)     Status: None   Collection Time: 10/12/16  8:55 AM  Result Value Ref Range   PTH 31 15 - 65 pg/mL     PHQ2/9: Depression screen [facility] 2/9 11/17/2016 03/30/2016 10/22/2015 04/24/2015 10/21/2014  Decreased Interest 0 0 0 0 0  Down, Depressed, Hopeless 0 0 0 0 0  PHQ - 2 Score 0 0 0 0 0     Fall Risk: Fall Risk  11/17/2016 03/30/2016 10/22/2015 04/24/2015 10/21/2014  Falls in the past year? _0     Functional Status Survey: Is the patient deaf or have difficulty hearing?: No Does the patient have difficulty seeing, even when wearing  glasses/contacts?: No Does the patient have difficulty concentrating, remembering, or making decisions?: No Does the patient have difficulty walking or climbing stairs?: No Does the patient have difficulty dressing or bathing?: No Does the patient have difficulty doing errands alone such as visiting a doctor's office or shopping?: No   Assessment & Plan  1. Age-related osteoporosis without current pathological fracture  Keep follow up with Dr. Gabriel Carina, waiting to start Forteo  2. Skull deformity  We will hold off on CT at this time  3. Left wrist pain  Advised otc brace and biofreeze, likely tendon related, it does not seem to be secondary to bone fracture  4. Encounter for screening mammogram for breast cancer  Due 11/2016  5. Closed  compression fracture of L3 lumbar vertebra, sequela  stable  6. Breast lump on left side at 6 o'clock position  Diagnostic mammogram   7. Needs flu shot  - Flu vaccine HIGH DOSE PF

## 2016-11-17 NOTE — Addendum Note (Signed)
Addended by: Inda Coke on: 11/17/2016 02:40 PM   Modules accepted: Orders

## 2016-11-22 ENCOUNTER — Ambulatory Visit: Payer: Federal, State, Local not specified - PPO | Admitting: Family Medicine

## 2016-11-25 ENCOUNTER — Ambulatory Visit
Admission: RE | Admit: 2016-11-25 | Discharge: 2016-11-25 | Disposition: A | Discharge status: Home / Self Care | Payer: Federal, State, Local not specified - PPO | Source: Ambulatory Visit | Attending: Family Medicine | Admitting: Family Medicine

## 2016-11-25 DIAGNOSIS — N6325 Unspecified lump in the left breast, overlapping quadrants: Secondary | ICD-10-CM

## 2016-11-25 DIAGNOSIS — N6320 Unspecified lump in the left breast, unspecified quadrant: Secondary | ICD-10-CM | POA: Insufficient documentation

## 2016-12-22 ENCOUNTER — Other Ambulatory Visit: Payer: Self-pay | Admitting: Family Medicine

## 2016-12-22 ENCOUNTER — Encounter: Payer: Self-pay | Admitting: Family Medicine

## 2016-12-22 DIAGNOSIS — E039 Hypothyroidism, unspecified: Secondary | ICD-10-CM

## 2016-12-22 NOTE — Telephone Encounter (Signed)
Patient requesting refill of levothyroxine to [facility].

## 2017-04-19 ENCOUNTER — Ambulatory Visit: Payer: Federal, State, Local not specified - PPO | Admitting: Family Medicine

## 2017-04-22 ENCOUNTER — Other Ambulatory Visit: Payer: Self-pay | Admitting: Family Medicine

## 2017-04-22 DIAGNOSIS — E785 Hyperlipidemia, unspecified: Secondary | ICD-10-CM

## 2017-04-22 DIAGNOSIS — E039 Hypothyroidism, unspecified: Secondary | ICD-10-CM

## 2017-04-22 NOTE — Telephone Encounter (Signed)
Pt calling in seeing if the levothyroxine can be called in for 90 days. Her insurance will now cover a 90 day script

## 2017-04-22 NOTE — Telephone Encounter (Signed)
Refill request for thyroid medication: Levothyroxine 25 mcg  Last Physical: 03/30/2016  Lab Results  Component Value Date   TSH 1.490 10/12/2016    Follow-up on file. 04/27/2017

## 2017-04-22 NOTE — Telephone Encounter (Signed)
FYI

## 2017-04-27 ENCOUNTER — Encounter: Payer: Self-pay | Admitting: Family Medicine

## 2017-04-27 ENCOUNTER — Ambulatory Visit: Payer: Federal, State, Local not specified - PPO | Admitting: Family Medicine

## 2017-04-27 VITALS — BP 122/68 | HR 75 | Temp 98.1°F | Resp 16 | Ht 62.0 in | Wt 106.9 lb

## 2017-04-27 DIAGNOSIS — K219 Gastro-esophageal reflux disease without esophagitis: Secondary | ICD-10-CM | POA: Diagnosis not present

## 2017-04-27 DIAGNOSIS — E039 Hypothyroidism, unspecified: Secondary | ICD-10-CM | POA: Diagnosis not present

## 2017-04-27 DIAGNOSIS — S32030S Wedge compression fracture of third lumbar vertebra, sequela: Secondary | ICD-10-CM | POA: Diagnosis not present

## 2017-04-27 DIAGNOSIS — E559 Vitamin D deficiency, unspecified: Secondary | ICD-10-CM

## 2017-04-27 DIAGNOSIS — R7989 Other specified abnormal findings of blood chemistry: Secondary | ICD-10-CM | POA: Diagnosis not present

## 2017-04-27 DIAGNOSIS — M8080XS Other osteoporosis with current pathological fracture, unspecified site, sequela: Secondary | ICD-10-CM

## 2017-04-27 DIAGNOSIS — E538 Deficiency of other specified B group vitamins: Secondary | ICD-10-CM

## 2017-04-27 DIAGNOSIS — K5909 Other constipation: Secondary | ICD-10-CM

## 2017-04-27 DIAGNOSIS — E785 Hyperlipidemia, unspecified: Secondary | ICD-10-CM

## 2017-04-27 DIAGNOSIS — Z23 Encounter for immunization: Secondary | ICD-10-CM

## 2017-04-27 MED ORDER — PRAVASTATIN SODIUM 20 MG PO TABS: ORAL_TABLET | ORAL | 0 refills | Status: DC

## 2017-04-27 MED ORDER — ZOSTER VAC RECOMB ADJUVANTED 50 MCG/0.5ML IM SUSR: 0.5000 mL | Freq: Once | INTRAMUSCULAR | 1 refills | Status: AC

## 2017-04-27 MED ORDER — OMEPRAZOLE 40 MG PO CPDR: 40.0000 mg | DELAYED_RELEASE_CAPSULE | ORAL | 1 refills | Status: DC

## 2017-04-27 NOTE — Addendum Note (Signed)
Addended by: Inda Coke on: 04/27/2017 11:14 AM   Modules accepted: Orders

## 2017-04-27 NOTE — Progress Notes (Signed)
Name: Heather Singh   MRN: [id number]    DOB: 1945/07/05   Date:04/27/2017       Progress Note  Subjective  Chief Complaint  Chief Complaint  Patient presents with  . Medication Refill    5 month F/U  . Hyperlipidemia    Leg cramps occasionally  . Hypothyroidism  . Allergic Rhinitis     Has been taking allergy medication daily due to weather  . Gastroesophageal Reflux    Well controlled    HPI  Osteoporosis: she has been seeing Dr. Gabriel Carina, Forteo started 10/2016. She is tolerating medication well, she is due for repeat bone density after one year of taking Forteo, still has back pain when standing for a long time, but no tingling or numbness or weakness related to fractures of spine.   GERD: Has been seeing Dr. Tami Ribas who Rx'd Omeprazole 40mg . Says symptoms have been well controlled at this dose. She would like to have her Omeprazole filled through our office. Still has a dry cough, but no heartburn or regurgitation   Hypothyroidism: she still feels tired, under more stress, worried about sister , now under hospice. But otherwise no change in bowel movement, dry skin is stable. No palpitation. Weight is stable  Hyperlipidemia: Taking Pravachol. No side effects from medications, no joint/muscle aches.   Chronic constipation: got a little with Forteo, but now is back to baseline, takes Miralax prn, no blood in stools, having bowel movements most days    Patient Active Problem List   Diagnosis Date Noted  . Closed compression fracture of L3 lumbar vertebra, sequela 08/23/2016  . Atrophic vaginitis 06/05/2015  . Acquired hypothyroidism 10/19/2014  . Perennial allergic rhinitis 10/19/2014  . B12 deficiency 10/19/2014  . Fibrocystic breast disease 10/19/2014  . Gastro-esophageal reflux disease without esophagitis 10/19/2014  . OP (osteoporosis) 10/19/2014  . Ovarian failure 10/19/2014  . Hiatal hernia 10/19/2014  . Restless legs syndrome 10/19/2014  . ABNORMAL EKG 09/03/2009   . Dyslipidemia 08/23/2008  . Vitamin D deficiency 10/10/2007    Past Surgical History:  Procedure Laterality Date  . BREAST CYST ASPIRATION Left   . BREAST EXCISIONAL BIOPSY Left 1980's   neg  . CATARACT EXTRACTION    . EYE SURGERY     left  . KYPHOPLASTY N/A 08/19/2016   Procedure: KYPHOPLASTY L3,L4;  Surgeon: Hessie Knows, MD;  Location: [facility] ORS;  Service: Orthopedics;  Laterality: N/A;  . KYPHOPLASTY N/A 09/21/2016   Procedure: [id number];  Surgeon: Hessie Knows, MD;  Location: [facility] OR[facility];  Service: Orthopedics;  Laterality: N/A;  . MOUTH SURGERY    . POLYPECTOMY    . SLING BLADE PROCEDURE    . TOTAL ABDOMINAL HYSTERECTOMY      Family History  Problem Relation Age of Onset  . Transient ischemic attack Mother   . Atrial fibrillation Mother   . Arthritis Mother        OA  . Heart disease Father        CHF  . CAD Father   . Hyperlipidemia Sister   . Arthritis Sister        OA  . Drug abuse Son   . Hypertension Son   . Stroke Sister   . Arthritis Sister        OA  . Heart failure Other   . Coronary artery disease Other   . Stroke Other   . Hyperlipidemia Other   . Hypertension Other   . Thyroid disease Other   . Breast  cancer Maternal Aunt   . Breast cancer Paternal Aunt     Social History   Socioeconomic History  . Marital status: Married    Spouse name: Not on file  . Number of children: Not on file  . Years of education: Not on file  . Highest education level: Not on file  Social Needs  . Financial resource strain: Not on file  . Food insecurity - worry: Not on file  . Food insecurity - inability: Not on file  . Transportation needs - medical: Not on file  . Transportation needs - non-medical: Not on file  Occupational History  . Not on file  Tobacco Use  . Smoking status: Never Smoker  . Smokeless tobacco: Never Used  Substance and Sexual Activity  . Alcohol use: No    Alcohol/week: 0.0 oz  . Drug use: No  . Sexual activity: Yes     Partners: Male  Other Topics Concern  . Not on file  Social History Narrative   Married   Gets regular exercise     Current Outpatient Medications:  .  calcium carbonate (TUMS EX) 750 MG chewable tablet, Chew 1 tablet by mouth daily., Disp: , Rfl:  .  Cholecalciferol (VITAMIN D) 2000 units CAPS, Take 1 capsule by mouth daily., Disp: , Rfl:  .  Insulin Pen Needle (COMFORT EZ PEN NEEDLES) 33G X 8 MM MISC, Use 1 each once daily., Disp: , Rfl:  .  levothyroxine (SYNTHROID, LEVOTHROID) 25 MCG tablet, Take 1 tablet by mouth every other day and take 2 tablets on the alternate days, Disp: 135 tablet, Rfl: 1 .  loratadine (CLARITIN) 10 MG tablet, Take 1 tablet by mouth daily., Disp: , Rfl:  .  omeprazole (PRILOSEC) 40 MG capsule, Take 1 capsule (40 mg total) by mouth every morning., Disp: 90 capsule, Rfl: 1 .  Polyethylene Glycol 400 (BLINK TEARS) 0.25 % SOLN, Place 1 drop into both eyes 2 (two) times daily as needed (dry eyes)., Disp: , Rfl:  .  pravastatin (PRAVACHOL) 20 MG tablet, TAKE 1 TABLET(20 MG) BY MOUTH DAILY, Disp: 90 tablet, Rfl: 0 .  prednisoLONE acetate (PRED FORTE) 1 % ophthalmic suspension, SHAKE LQ AND INT 1 GTT IN OU TID FOR 7 DAYS, Disp: , Rfl: 0 .  Teriparatide, Recombinant, 600 MCG/2.4ML SOLN, Inject into the skin., Disp: , Rfl:  .  vitamin B-12 (CYANOCOBALAMIN) 1000 MCG tablet, Take 1,000 mcg by mouth 3 (three) times a week., Disp: , Rfl:  .  triamcinolone cream (KENALOG) 0.1 %, Apply topically., Disp: , Rfl:  .  Zoster Vaccine Adjuvanted [facility]) injection, Inject 0.5 mLs into the muscle once for 1 dose., Disp: 0.5 mL, Rfl: 1  Allergies  Allergen Reactions  . Alendronate     Dyspepsia, inflamed esophagus   . Azithromycin Hives  . Levofloxacin Hives  . Nitrofurantoin Monohyd Macro Hives  . Sulfamethoxazole-Trimethoprim Hives  . Etodolac Hives  . Penicillins Hives and Rash    Has patient had a PCN reaction causing immediate rash, facial/tongue/throat swelling, SOB or  lightheadedness with hypotension: No Has patient had a PCN reaction causing severe rash involving mucus membranes or skin necrosis: No Has patient had a PCN reaction that required hospitalization: No Has patient had a PCN reaction occurring within the last 10 years: No If all of the above answers are "NO", then may proceed with Cephalosporin use.      ROS  Constitutional: Negative for fever or weight change.  Respiratory: Negative for cough and  shortness of breath.   Cardiovascular: Negative for chest pain or palpitations.  Gastrointestinal: Negative for abdominal pain, no bowel changes.  Musculoskeletal: Negative for gait problem or joint swelling.  Skin: Negative for rash.  Neurological: Negative for dizziness or headache.  No other specific complaints in a complete review of systems (except as listed in HPI above).  Objective  Vitals:   04/27/17 1013  BP: 122/68  Pulse: 75  Resp: 16  Temp: 98.1 F (36.7 C)  TempSrc: Oral  SpO2: 94%  Weight: 106 lb 14.4 oz (48.5 kg)  Height: 5\' 2"  (1.575 m)    Body mass index is 19.55 kg/m.  Physical Exam  Constitutional: Patient appears well-developed and well-nourished.  No distress.  HEENT: head atraumatic, normocephalic, pupils equal and reactive to light,neck supple, throat within normal limits Cardiovascular: Normal rate, regular rhythm and normal heart sounds.  No murmur heard. No BLE edema. Pulmonary/Chest: Effort normal and breath sounds normal. No respiratory distress. Abdominal: Soft.  There is no tenderness. Psychiatric: Patient has a normal mood and affect. behavior is normal. Judgment and thought content normal.  PHQ2/9: Depression screen [facility] 2/9 04/27/2017 11/17/2016 03/30/2016 10/22/2015 04/24/2015  Decreased Interest 0 0 0 0 0  Down, Depressed, Hopeless 0 0 0 0 0  PHQ - 2 Score 0 0 0 0 0     Fall Risk: Fall Risk  04/27/2017 11/17/2016 03/30/2016 10/22/2015 04/24/2015  Falls in the past year? No No No No No       Functional Status Survey: Is the patient deaf or have difficulty hearing?: Yes Does the patient have difficulty seeing, even when wearing glasses/contacts?: No Does the patient have difficulty concentrating, remembering, or making decisions?: No Does the patient have difficulty walking or climbing stairs?: No Does the patient have difficulty dressing or bathing?: No Does the patient have difficulty doing errands alone such as visiting a doctor's office or shopping?: No    Assessment & Plan  1. Acquired hypothyroidism  - TSH  2. Vitamin D deficiency  Continue otc supplementation   3. B12 deficiency  Continue otc supplementation   4. Closed compression fracture of L3 lumbar vertebra, sequela  Still has mild pain when standing for a prolonged period of time, getting Forteo month 6, seen by Dr. Gabriel Carina   5. Dyslipidemia  - pravastatin (PRAVACHOL) 20 MG tablet; TAKE 1 TABLET(20 MG) BY MOUTH DAILY  Dispense: 90 tablet; Refill: 0  6. Gastro-esophageal reflux disease without esophagitis  - omeprazole (PRILOSEC) 40 MG capsule; Take 1 capsule (40 mg total) by mouth every morning.  Dispense: 90 capsule; Refill: 1  7. Other osteoporosis with current pathological fracture, sequela  Continue Forteo   8. Abnormal CBC  - CBC  9. Need for shingles vaccine  - Zoster Vaccine Adjuvanted [facility]) injection; Inject 0.5 mLs into the muscle once for 1 dose.  Dispense: 0.5 mL; Refill: 1

## 2017-04-30 LAB — CBC
Hematocrit: 36.7 % (ref 34.0–46.6)
Hemoglobin: 12.4 g/dL (ref 11.1–15.9)
MCH: 32.3 pg (ref 26.6–33.0)
MCHC: 33.8 g/dL (ref 31.5–35.7)
MCV: 96 fL (ref 79–97)
Platelets: 252 10*3/uL (ref 150–379)
RBC: 3.84 x10E6/uL (ref 3.77–5.28)
RDW: 13 % (ref 12.3–15.4)
WBC: 3.7 10*3/uL (ref 3.4–10.8)

## 2017-04-30 LAB — TSH: TSH: 2.63 u[IU]/mL (ref 0.450–4.500)

## 2017-08-23 ENCOUNTER — Encounter: Payer: Self-pay | Admitting: Family Medicine

## 2017-08-23 ENCOUNTER — Ambulatory Visit: Payer: Federal, State, Local not specified - PPO | Admitting: Family Medicine

## 2017-08-23 VITALS — BP 132/72 | HR 76 | Temp 98.0°F | Resp 16 | Ht 62.0 in | Wt 103.4 lb

## 2017-08-23 DIAGNOSIS — L989 Disorder of the skin and subcutaneous tissue, unspecified: Secondary | ICD-10-CM | POA: Diagnosis not present

## 2017-08-23 MED ORDER — LIDOCAINE HCL (PF) 1 % IJ SOLN
2.0000 mL | Freq: Once | INTRAMUSCULAR | Status: AC
Start: 2017-08-23 — End: 2017-08-23
  Administered 2017-08-23: 2 mL via INTRADERMAL

## 2017-08-23 NOTE — Progress Notes (Signed)
patholName: Heather Singh   MRN: [id number]    DOB: December 25, 1945   Date:08/23/2017       Progress Note  Subjective  Chief Complaint  Chief Complaint  Patient presents with  . Arm Injury    Onset-a couple of weeks ago patient scrapped her left arm against the fireplace and it healed different. Patient applied Neosporin to the wound and kept her wound covered.     HPI  Skin lesion: she injured her left arm on a brick fireplace, it drained a blood and serous material that finally ended but now has a raised area with a erythematous dot in center and is worried about it. It happened a few weeks ago   Patient Active Problem List   Diagnosis Date Noted  . Closed compression fracture of L3 lumbar vertebra, sequela 08/23/2016  . Atrophic vaginitis 06/05/2015  . Acquired hypothyroidism 10/19/2014  . Perennial allergic rhinitis 10/19/2014  . B12 deficiency 10/19/2014  . Fibrocystic breast disease 10/19/2014  . Gastro-esophageal reflux disease without esophagitis 10/19/2014  . OP (osteoporosis) 10/19/2014  . Ovarian failure 10/19/2014  . Hiatal hernia 10/19/2014  . Restless legs syndrome 10/19/2014  . ABNORMAL EKG 09/03/2009  . Dyslipidemia 08/23/2008  . Vitamin D deficiency 10/10/2007    Social History   Tobacco Use  . Smoking status: Never Smoker  . Smokeless tobacco: Never Used  Substance Use Topics  . Alcohol use: No    Alcohol/week: 0.0 oz     Current Outpatient Medications:  .  calcium carbonate (TUMS EX) 750 MG chewable tablet, Chew 1 tablet by mouth daily., Disp: , Rfl:  .  Cholecalciferol (VITAMIN D) 2000 units CAPS, Take 1 capsule by mouth daily., Disp: , Rfl:  .  Insulin Pen Needle (COMFORT EZ PEN NEEDLES) 33G X 8 MM MISC, Use 1 each once daily., Disp: , Rfl:  .  levothyroxine (SYNTHROID, LEVOTHROID) 25 MCG tablet, Take 1 tablet by mouth every other day and take 2 tablets on the alternate days, Disp: 135 tablet, Rfl: 1 .  loratadine (CLARITIN) 10 MG tablet, Take 1  tablet by mouth daily., Disp: , Rfl:  .  omeprazole (PRILOSEC) 40 MG capsule, Take 1 capsule (40 mg total) by mouth every morning., Disp: 90 capsule, Rfl: 1 .  Polyethylene Glycol 400 (BLINK TEARS) 0.25 % SOLN, Place 1 drop into both eyes 2 (two) times daily as needed (dry eyes)., Disp: , Rfl:  .  pravastatin (PRAVACHOL) 20 MG tablet, TAKE 1 TABLET(20 MG) BY MOUTH DAILY, Disp: 90 tablet, Rfl: 0 .  Teriparatide, Recombinant, 600 MCG/2.4ML SOLN, Inject into the skin., Disp: , Rfl:  .  vitamin B-12 (CYANOCOBALAMIN) 1000 MCG tablet, Take 1,000 mcg by mouth 3 (three) times a week., Disp: , Rfl:  .  prednisoLONE acetate (PRED FORTE) 1 % ophthalmic suspension, SHAKE LQ AND INT 1 GTT IN OU TID FOR 7 DAYS, Disp: , Rfl: 0 .  triamcinolone cream (KENALOG) 0.1 %, Apply topically., Disp: , Rfl:   Allergies  Allergen Reactions  . Alendronate     Dyspepsia, inflamed esophagus   . Azithromycin Hives  . Levofloxacin Hives  . Nitrofurantoin Monohyd Macro Hives  . Sulfamethoxazole-Trimethoprim Hives  . Etodolac Hives  . Penicillins Hives and Rash    Has patient had a PCN reaction causing immediate rash, facial/tongue/throat swelling, SOB or lightheadedness with hypotension: No Has patient had a PCN reaction causing severe rash involving mucus membranes or skin necrosis: No Has patient had a PCN reaction that required hospitalization:  No Has patient had a PCN reaction occurring within the last 10 years: No If all of the above answers are "NO", then may proceed with Cephalosporin use.     ROS  Not done   Objective  Vitals:   08/23/17 1539  BP: 132/72  Pulse: 76  Resp: 16  Temp: 98 F (36.7 C)  TempSrc: Oral  SpO2: 96%  Weight: 103 lb 6.4 oz (46.9 kg)  Height: 5\' 2"  (1.575 m)    Body mass index is 18.91 kg/m.    Physical Exam  Skin: she has thin skin, and two areas of hypertrophy on left arm, one of them has a red spot on it, non tender to touch, not currently draining, no previous  history of keloid, may have foreign in it.   Assessment & Plan  1. Skin lesion of left arm  -shave biopsy   - lidocaine (PF) (XYLOCAINE) 1 % injection 2 mL - Pathology Consent signed: YES  Procedure: Skin Mass Removal Location: left arm  Equipment used: derma-blade, high temperature cautery, sterile scalpel, tweezers, curved scissors Anesthesia: 1% Lidocaine w/o Epinephrine  Cleaned and prepped: Betadine  After consent signed, are of skin prepped with betadine. Lidocaine w/o epinephrine injected into skin underneath skin mass. After properly numbed sterile equipment used to remove tag.  Specimen sent for pathology analysis. Instructed on proper care to allow for proper healing. F/U for nursing visit if needed.

## 2017-08-29 LAB — TISSUE SPECIMEN

## 2017-08-29 LAB — PATHOLOGY

## 2017-09-15 IMAGING — MR MR LUMBAR SPINE W/O CM
5 series · 35 of 48 positions shown · non-contrast
Comparison: 09/11/2016 radiographs; MRI from 08/12/2016

CLINICAL DATA: Popping injury in the back on September 11, 2016. History
of compression fracture with kyphoplasty.

EXAM:
MRI LUMBAR SPINE WITHOUT CONTRAST
TECHNIQUE: Multiplanar, multisequence MR imaging of the lumbar spine was
performed. No intravenous contrast was administered.

[Series 2: T2 · sagittal · 4.0mm · 0.81mm/px · 7 of 15 slices shown (1 of 2)]
[im 1/15]
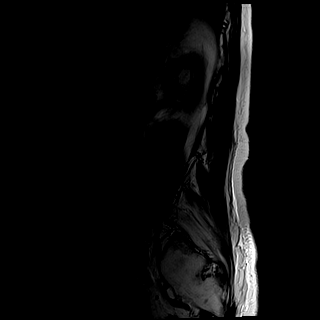
[im 3/15]
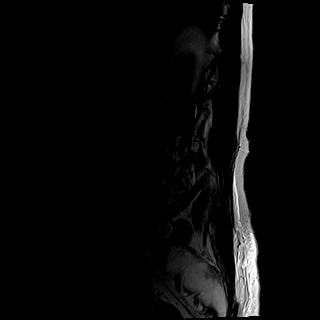
[im 5/15]
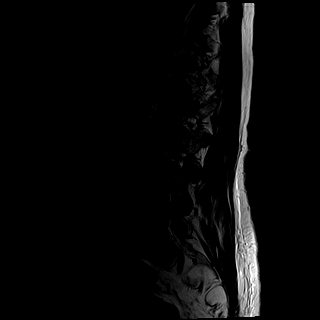
[im 8/15]
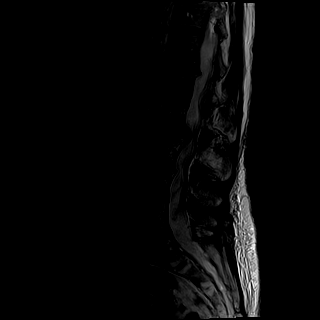
[im 10/15]
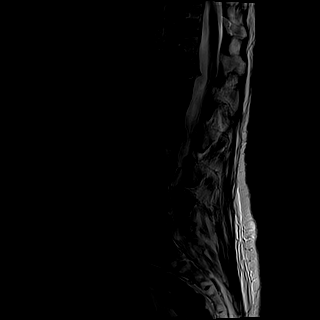
[im 12/15]
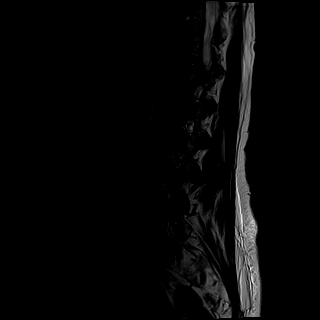
[im 15/15]
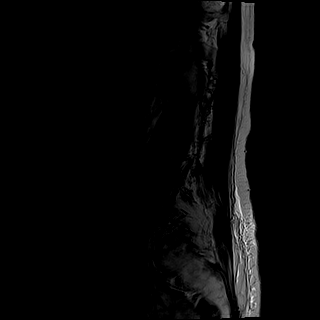

[Series 3: T1 · sagittal · 4.0mm · 0.81mm/px · 7 of 15 slices shown (1 of 2)]
[im 1/15]
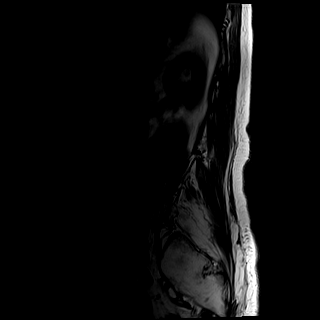
[im 3/15]
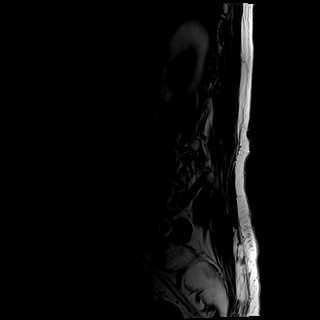
[im 5/15]
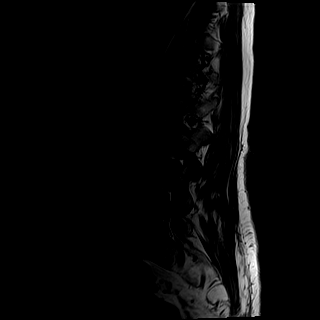
[im 8/15]
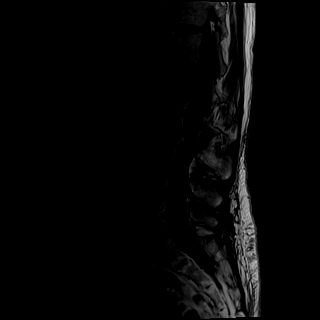
[im 10/15]
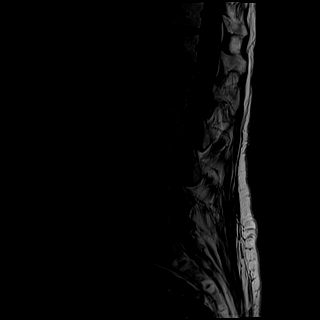
[im 12/15]
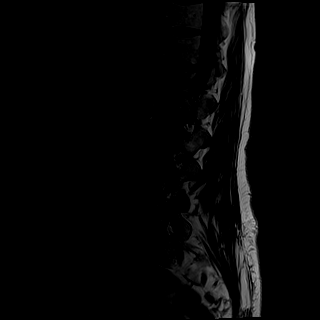
[im 15/15]
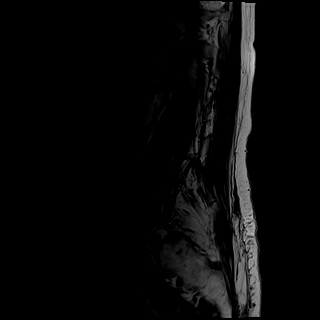

[Series 4: STIR · sagittal · 4.0mm · 1.02mm/px · 5 of 15 slices shown]
[im 1/15]
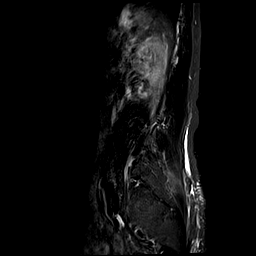
[im 3/15]
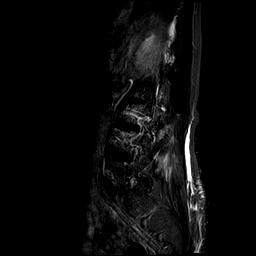
[im 6/15]
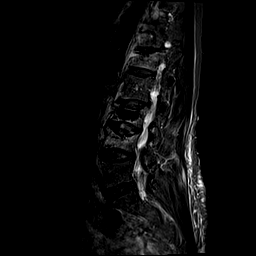
[im 9/15]
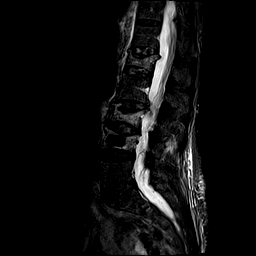
[im 12/15]
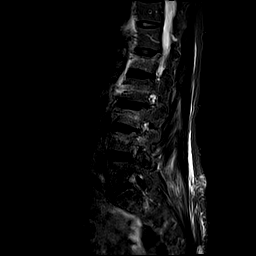

[Series 5: T2 · axial · 4.0mm · 0.78mm/px · z∈[-72,+119]mm · 8 of 34 slices shown (2 of 2)]
[im 1/34]
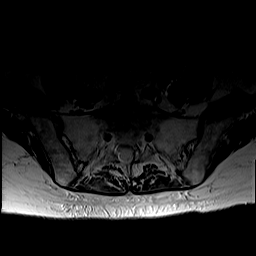
[im 6/34]
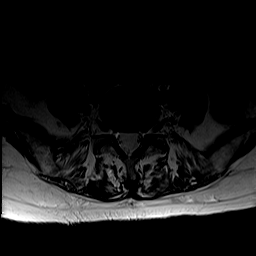
[im 11/34]
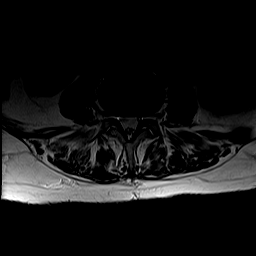
[im 16/34]
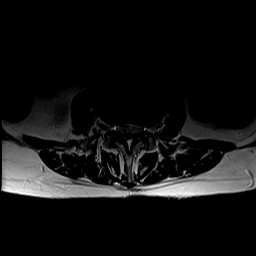
[im 18/34]
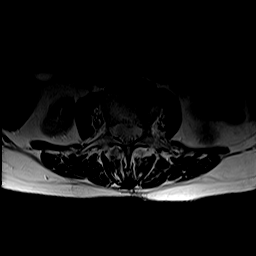
[im 23/34]
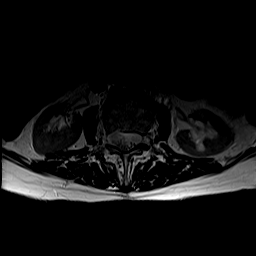
[im 28/34]
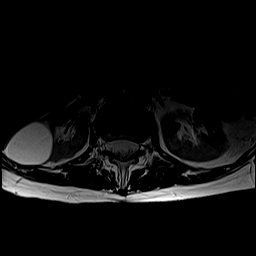
[im 34/34]
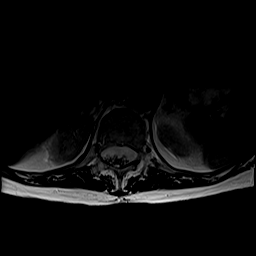

[Series 6: T1 · axial · 4.0mm · 0.39mm/px · z∈[-72,+119]mm · 8 of 34 slices shown (2 of 2)]
[im 1/34]
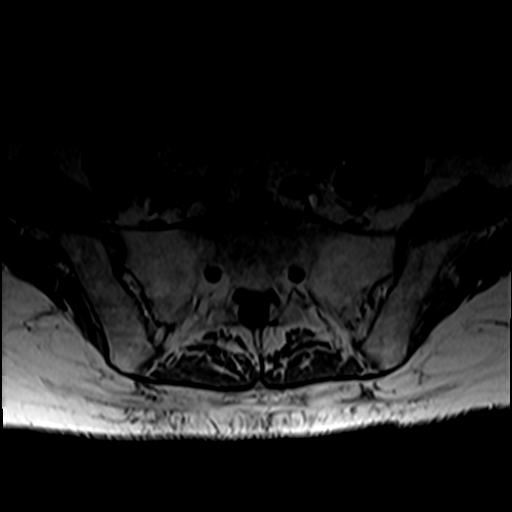
[im 6/34]
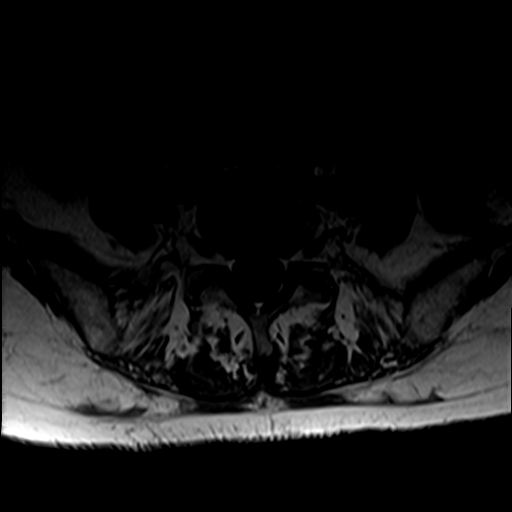
[im 11/34]
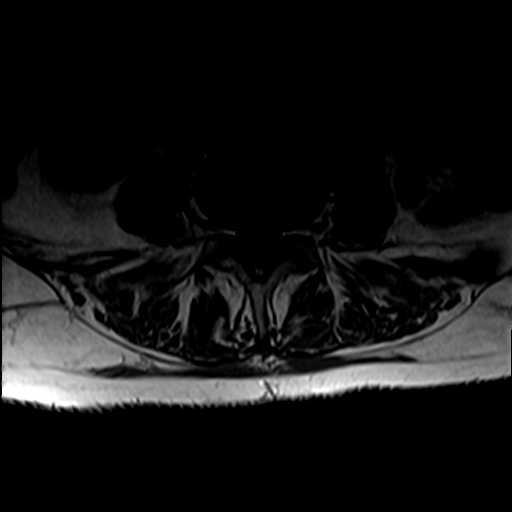
[im 16/34]
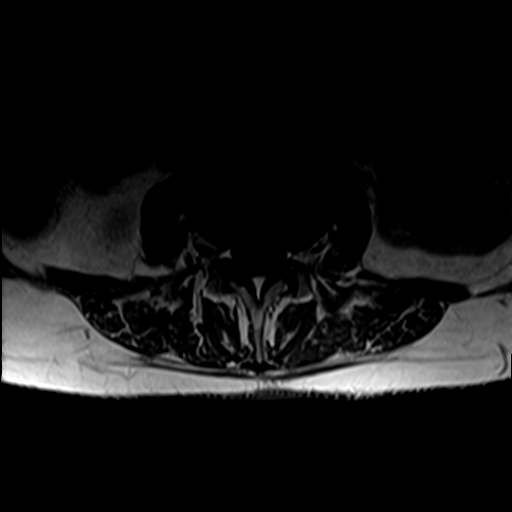
[im 18/34]
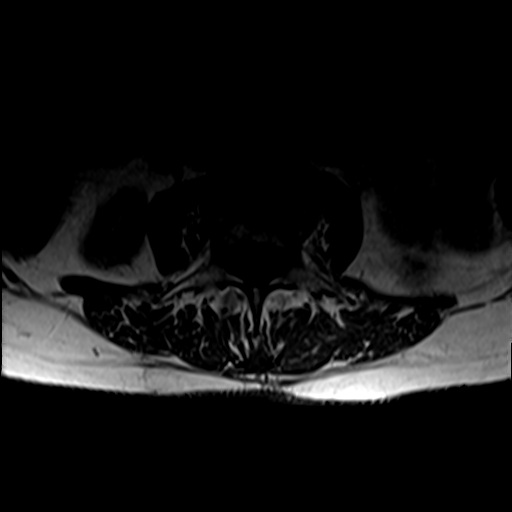
[im 23/34]
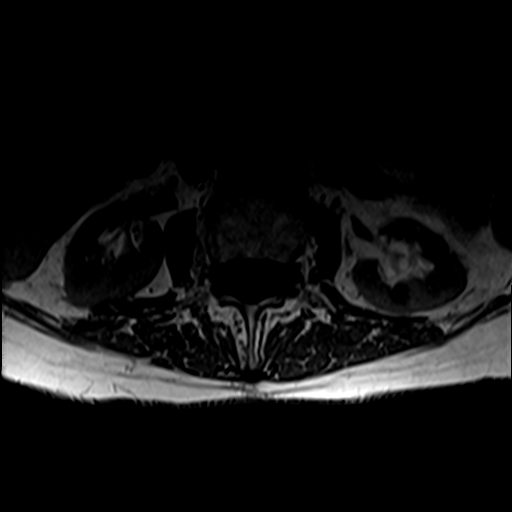
[im 28/34]
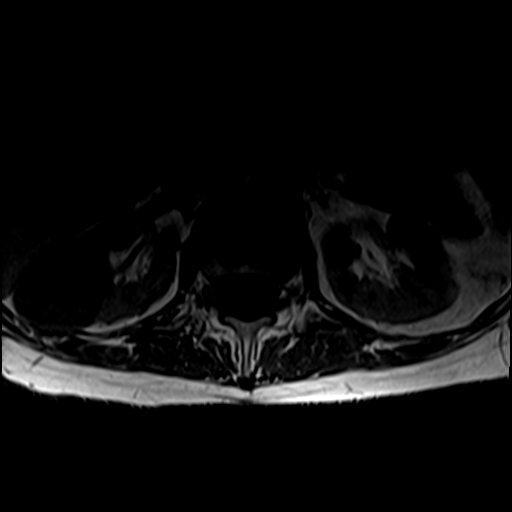
[im 34/34]
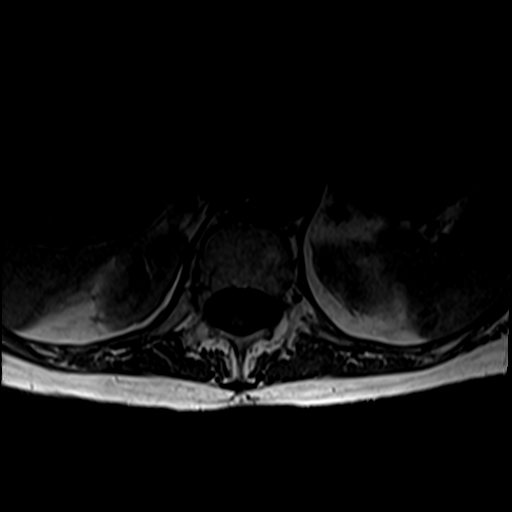

[35 of 48 positions shown; findings below may reference images not displayed]

FINDINGS: Segmentation: The lowest lumbar type non-rib-bearing vertebra is
labeled as L5.

Alignment:  No vertebral subluxation is observed.

Vertebrae: As suggested on conventional radiographs, there is a 45%
superior endplate compression fracture at L1 with associated
vertebral edema, and 4 mm of focal posterior bony retropulsion along
the posterosuperior endplate. Low-level paraspinal edema is noted,
and this compression fracture was not present on prior MRI from
08/12/2016. No evidence of underlying lesion to suggest pathologic
compression fracture, osteoporosis is favored.

Prior compression fractures at L 3 and L4 with evidence of vertebral
augmentation. There is vertebral edema in the L3 and L4 vertebral
bodies but given that these were just treated on 08/19/2016 this
edema is not thought to be necessarily abnormal. There has been no
further appreciable collapse of the L 3 or L4 vertebra compared to
the pre -augmentation MRI of 08/12/2016. No compelling evidence of
complicating feature related to the vertebral augmentation.

The L3 compression fracture is associated with 5 mm of posterior
bony retropulsion and the L4 compression fractures associated with 3
mm posterior bony retropulsion. This is similar to prior.

Conus medullaris: Extends to the T12 level and appears normal.

Paraspinal and other soft tissues: As noted above there is low-grade
paraspinal edema associated with the subacute L1 compression
fracture. Bilateral renal fluid signal intensity lesions favor
cysts.

Disc levels:

T12-L1: No impingement. 4 mm of posterior bony retropulsion
associated with the superior endplate compression fracture at L1.

L1-2:  Unremarkable.

L2-3: Overall moderate central narrowing of the thecal sac,
eccentric to the left, with mild left subarticular lateral recess
stenosis due to left paracentral disc protrusion, disc bulge, and
posterior bony retropulsion from the superior endplate of L3.

L3-4: Mild left and borderline right foraminal stenosis and mild
central narrowing of the thecal sac due to posterior bony
retropulsion, disc bulge, and facet arthropathy.

L4-5: Stable mild bilateral foraminal stenosis and borderline
bilateral subarticular lateral recess stenosis due to disc bulge and
facet arthropathy.

L5-S1:  No impingement.  Diffuse disc bulge.
IMPRESSION: 1. Early subacute 45% superior endplate compression fracture at L1
with 4 mm of posterior bony retropulsion. Mild associated paraspinal
edema.
2. Old compression fractures at L3 and L4 with interval vertebral
augmentation. No progressive collapse of these vertebra compared to
the prior MRI.
3. In conjunction with the associated bony retropulsion, spondylosis
and degenerative disc disease contribute to moderate impingement at
L2- 3 and mild impingement at L3-4 and L4-5, as detailed above.

## 2017-10-10 ENCOUNTER — Other Ambulatory Visit: Payer: Self-pay | Admitting: Family Medicine

## 2017-10-10 DIAGNOSIS — E039 Hypothyroidism, unspecified: Secondary | ICD-10-CM

## 2017-10-17 DIAGNOSIS — H40003 Preglaucoma, unspecified, bilateral: Secondary | ICD-10-CM | POA: Diagnosis not present

## 2017-10-24 DIAGNOSIS — H40003 Preglaucoma, unspecified, bilateral: Secondary | ICD-10-CM | POA: Diagnosis not present

## 2017-10-25 ENCOUNTER — Encounter: Payer: Self-pay | Admitting: Family Medicine

## 2017-10-25 ENCOUNTER — Ambulatory Visit (INDEPENDENT_AMBULATORY_CARE_PROVIDER_SITE_OTHER): Payer: Medicare Other | Admitting: Family Medicine

## 2017-10-25 VITALS — BP 126/62 | HR 69 | Temp 98.2°F | Resp 16 | Ht 62.0 in | Wt 105.4 lb

## 2017-10-25 DIAGNOSIS — E785 Hyperlipidemia, unspecified: Secondary | ICD-10-CM | POA: Diagnosis not present

## 2017-10-25 DIAGNOSIS — E538 Deficiency of other specified B group vitamins: Secondary | ICD-10-CM

## 2017-10-25 DIAGNOSIS — M810 Age-related osteoporosis without current pathological fracture: Secondary | ICD-10-CM | POA: Diagnosis not present

## 2017-10-25 DIAGNOSIS — Z79899 Other long term (current) drug therapy: Secondary | ICD-10-CM

## 2017-10-25 DIAGNOSIS — E559 Vitamin D deficiency, unspecified: Secondary | ICD-10-CM | POA: Diagnosis not present

## 2017-10-25 DIAGNOSIS — K219 Gastro-esophageal reflux disease without esophagitis: Secondary | ICD-10-CM | POA: Diagnosis not present

## 2017-10-25 DIAGNOSIS — E039 Hypothyroidism, unspecified: Secondary | ICD-10-CM

## 2017-10-25 DIAGNOSIS — K5909 Other constipation: Secondary | ICD-10-CM

## 2017-10-25 MED ORDER — LEVOTHYROXINE SODIUM 25 MCG PO TABS: ORAL_TABLET | ORAL | 0 refills | Status: DC

## 2017-10-25 MED ORDER — PRAVASTATIN SODIUM 20 MG PO TABS: ORAL_TABLET | ORAL | 1 refills | Status: DC

## 2017-10-25 MED ORDER — OMEPRAZOLE 40 MG PO CPDR: 40.0000 mg | DELAYED_RELEASE_CAPSULE | ORAL | 1 refills | Status: DC

## 2017-10-25 MED ORDER — RANITIDINE HCL 150 MG PO TABS: 150.0000 mg | ORAL_TABLET | Freq: Every day | ORAL | 1 refills | Status: DC

## 2017-10-25 NOTE — Progress Notes (Signed)
Name: Heather Singh   MRN: [id number]    DOB: 03/20/1945   Date:10/25/2017       Progress Note  Subjective  Chief Complaint  Chief Complaint  Patient presents with  . Medication Refill  . Hyperlipidemia  . Hypothyroidism  . Allergic Rhinitis   . Gastroesophageal Reflux    HPI  Osteoporosis: she has been seeing Dr. Gabriel Carina, Forteo started 10/2016. She is tolerating medication well, she is due for repeat bone density after one year of taking Forteo, still has back pain when standing for a long time, but no tingling or numbness or weakness related to fractures of spine. Unchanged. She will have repeat bone density soon.   GERD: Has been seeing Dr. Tami Ribas who Rx'd Omeprazole 40mg . Says symptoms have been well controlled at this dose. She would like to have her Omeprazole filled through our office. Still has a dry cough, but no heartburn or regurgitation discussed side effects of PPI, and she can try to change to Ranitidine    Hypothyroidism:she still feels tired, under more stress, worried about sister , she has under hospice for over 6 months. Explained that it may be the cause of her fatigue. She denies change in bowel movement, she has mild dry skin but stable.   Hyperlipidemia:TakingPravachol. Noside effectsfrom medications, no joint/muscle aches.   Chronic constipation: doing well on Miralax , almost daily, Bristol scale 4 when she takes medication, she skips doses and Sherian Maroon goes to a 1.   Patient Active Problem List   Diagnosis Date Noted  . Closed compression fracture of L3 lumbar vertebra, sequela 08/23/2016  . Atrophic vaginitis 06/05/2015  . Acquired hypothyroidism 10/19/2014  . Perennial allergic rhinitis 10/19/2014  . B12 deficiency 10/19/2014  . Fibrocystic breast disease 10/19/2014  . Gastro-esophageal reflux disease without esophagitis 10/19/2014  . OP (osteoporosis) 10/19/2014  . Ovarian failure 10/19/2014  . Hiatal hernia 10/19/2014  . Restless legs  syndrome 10/19/2014  . ABNORMAL EKG 09/03/2009  . Dyslipidemia 08/23/2008  . Vitamin D deficiency 10/10/2007    Past Surgical History:  Procedure Laterality Date  . BREAST CYST ASPIRATION Left   . BREAST EXCISIONAL BIOPSY Left 1980's   neg  . CATARACT EXTRACTION    . EYE SURGERY     left  . KYPHOPLASTY N/A 08/19/2016   Procedure: KYPHOPLASTY L3,L4;  Surgeon: Hessie Knows, MD;  Location: [facility] ORS;  Service: Orthopedics;  Laterality: N/A;  . KYPHOPLASTY N/A 09/21/2016   Procedure: [id number];  Surgeon: Hessie Knows, MD;  Location: [facility] ORS;  Service: Orthopedics;  Laterality: N/A;  . MOUTH SURGERY    . POLYPECTOMY    . SLING BLADE PROCEDURE    . TOTAL ABDOMINAL HYSTERECTOMY      Family History  Problem Relation Age of Onset  . Transient ischemic attack Mother   . Atrial fibrillation Mother   . Arthritis Mother        OA  . Heart disease Father        CHF  . CAD Father   . Hyperlipidemia Sister   . Arthritis Sister        OA  . Drug abuse Son   . Hypertension Son   . Stroke Sister   . Arthritis Sister        OA  . Heart failure Other   . Coronary artery disease Other   . Stroke Other   . Hyperlipidemia Other   . Hypertension Other   . Thyroid disease Other   . Breast  cancer Maternal Aunt   . Breast cancer Paternal Aunt     Social History   Socioeconomic History  . Marital status: Married    Spouse name: Not on file  . Number of children: Not on file  . Years of education: Not on file  . Highest education level: Not on file  Occupational History  . Not on file  Social Needs  . Financial resource strain: Not on file  . Food insecurity:    Worry: Not on file    Inability: Not on file  . Transportation needs:    Medical: Not on file    Non-medical: Not on file  Tobacco Use  . Smoking status: Never Smoker  . Smokeless tobacco: Never Used  Substance and Sexual Activity  . Alcohol use: No    Alcohol/week: 0.0 standard drinks  . Drug use: No  .  Sexual activity: Yes    Partners: Male  Lifestyle  . Physical activity:    Days per week: Not on file    Minutes per session: Not on file  . Stress: Not on file  Relationships  . Social connections:    Talks on phone: Not on file    Gets together: Not on file    Attends religious service: Not on file    Active member of club or organization: Not on file    Attends meetings of clubs or organizations: Not on file    Relationship status: Not on file  . Intimate partner violence:    Fear of current or ex partner: Not on file    Emotionally abused: Not on file    Physically abused: Not on file    Forced sexual activity: Not on file  Other Topics Concern  . Not on file  Social History Narrative   Married   Gets regular exercise     Current Outpatient Medications:  .  calcium carbonate (TUMS EX) 750 MG chewable tablet, Chew 1 tablet by mouth daily., Disp: , Rfl:  .  Cholecalciferol (VITAMIN D) 2000 units CAPS, Take 1 capsule by mouth daily., Disp: , Rfl:  .  Insulin Pen Needle (COMFORT EZ PEN NEEDLES) 33G X 8 MM MISC, Use 1 each once daily., Disp: , Rfl:  .  levothyroxine (SYNTHROID, LEVOTHROID) 25 MCG tablet, TAKE 1 TABLET BY MOUTH EVERY OTHER DAY AND TAKE 2 TABLETS ON THE ALTERNATE DAYS, Disp: 135 tablet, Rfl: 0 .  loratadine (CLARITIN) 10 MG tablet, Take 1 tablet by mouth daily., Disp: , Rfl:  .  omeprazole (PRILOSEC) 40 MG capsule, Take 1 capsule (40 mg total) by mouth every morning., Disp: 90 capsule, Rfl: 1 .  Polyethylene Glycol 400 (BLINK TEARS) 0.25 % SOLN, Place 1 drop into both eyes 2 (two) times daily as needed (dry eyes)., Disp: , Rfl:  .  pravastatin (PRAVACHOL) 20 MG tablet, TAKE 1 TABLET(20 MG) BY MOUTH DAILY, Disp: 90 tablet, Rfl: 0 .  prednisoLONE acetate (PRED FORTE) 1 % ophthalmic suspension, SHAKE LQ AND INT 1 GTT IN OU TID FOR 7 DAYS, Disp: , Rfl: 0 .  Teriparatide, Recombinant, 600 MCG/2.4ML SOLN, Inject into the skin., Disp: , Rfl:  .  vitamin B-12  (CYANOCOBALAMIN) 1000 MCG tablet, Take 1,000 mcg by mouth 3 (three) times a week., Disp: , Rfl:  .  triamcinolone cream (KENALOG) 0.1 %, Apply topically., Disp: , Rfl:   Allergies  Allergen Reactions  . Alendronate     Dyspepsia, inflamed esophagus   . Azithromycin Hives  .  Levofloxacin Hives  . Nitrofurantoin Monohyd Macro Hives  . Sulfamethoxazole-Trimethoprim Hives  . Etodolac Hives  . Penicillins Hives and Rash    Has patient had a PCN reaction causing immediate rash, facial/tongue/throat swelling, SOB or lightheadedness with hypotension: No Has patient had a PCN reaction causing severe rash involving mucus membranes or skin necrosis: No Has patient had a PCN reaction that required hospitalization: No Has patient had a PCN reaction occurring within the last 10 years: No If all of the above answers are "NO", then may proceed with Cephalosporin use.      ROS  Constitutional: Negative for fever or weight change.  Respiratory:Positive for cough ( seen by ENT)  She denies  shortness of breath.   Cardiovascular: Negative for chest pain or palpitations.  Gastrointestinal: Negative for abdominal pain, no bowel changes.  Musculoskeletal: Negative for gait problem or joint swelling.  Skin: Negative for rash.  Neurological: Negative for dizziness or headache.  No other specific complaints in a complete review of systems (except as listed in HPI above).  Objective  Vitals:   10/25/17 0907  BP: 126/62  Pulse: 69  Resp: 16  Temp: 98.2 F (36.8 C)  TempSrc: Oral  SpO2: 96%  Weight: 105 lb 6.4 oz (47.8 kg)  Height: 5\' 2"  (1.575 m)    Body mass index is 19.28 kg/m.  Physical Exam  Constitutional: Patient appears well-developed and well-nourished. No distress.  HEENT: head atraumatic, normocephalic, pupils equal and reactive to light,  neck supple, throat within normal limits Cardiovascular: Normal rate, regular rhythm and normal heart sounds.  No murmur heard. No BLE  edema. Pulmonary/Chest: Effort normal and breath sounds normal. No respiratory distress. Abdominal: Soft.  There is no tenderness. Psychiatric: Patient has a normal mood and affect. behavior is normal. Judgment and thought content normal.  Recent Results (from the past 2160 hour(s))  Pathology     Status: None   Collection Time: 08/24/17  9:12 AM  Result Value Ref Range   Relevant History:      Comment: L98.9   Pathologist:      Comment: Charlyne Mom M.D., Board Certified in Anatomic Pathology and Dermatopathology (electronic signature)   TISSUE SPECIMEN     Status: None   Collection Time: 08/24/17  9:12 AM  Result Value Ref Range   A Source      Comment: Skin biopsy, left arm   A Gross Description      Comment: Received in a single container of formalin  labeled with two identifiers, patient name and  req. no. [id number], as well as "left arm," is a shave  biopsy measuring 1.7 x 0.8 x 0.3 cm displaying a  well-circumscribed, raised, partially crusted and  pale tan to tan lesion measuring 0.6 x 0.3 x 0.2  cm.  The surgical margin is inked violet.  The  specimen is serially sectioned and submitted  entirely with opposite edges in cassette 1 and  the remainder of the specimen in cassette 2.   Total of two blocks submitted.         A Diagnosis      Comment: Hemangioma, traumatized . Marland Kitchen       PHQ2/9: Depression screen [facility] 2/9 10/25/2017 08/23/2017 04/27/2017 11/17/2016 03/30/2016  Decreased Interest 0 0 0 0 0  Down, Depressed, Hopeless 0 0 0 0 0  PHQ - 2 Score 0 0 0 0 0     Fall Risk: Fall Risk  10/25/2017 08/23/2017 04/27/2017 11/17/2016 03/30/2016  Falls  in the past year? No No No No No     Functional Status Survey: Is the patient deaf or have difficulty hearing?: No Does the patient have difficulty seeing, even when wearing glasses/contacts?: Yes Does the patient have difficulty concentrating, remembering, or making decisions?: No Does the patient have difficulty walking  or climbing stairs?: No Does the patient have difficulty dressing or bathing?: No Does the patient have difficulty doing errands alone such as visiting a doctor's office or shopping?: No   Assessment & Plan  1. Dyslipidemia  - Lipid panel - pravastatin (PRAVACHOL) 20 MG tablet; TAKE 1 TABLET(20 MG) BY MOUTH DAILY  Dispense: 90 tablet; Refill: 1  2. Acquired hypothyroidism  - TSH - levothyroxine (SYNTHROID, LEVOTHROID) 25 MCG tablet; Take one daily alternating with 2 pills every other day  Dispense: 135 tablet; Refill: 0  3. Vitamin D deficiency  - VITAMIN D 25 Hydroxy (Vit-D Deficiency, Fractures)  4. B12 deficiency  - Vitamin B12  5. Age-related osteoporosis without current pathological fracture  Continue follow up with Dr. Gabriel Carina   6. Chronic constipation  Doing well on Miralax  7. Gastro-esophageal reflux disease without esophagitis  - ranitidine (ZANTAC) 150 MG tablet; Take 1 tablet (150 mg total) by mouth at bedtime.  Dispense: 90 tablet; Refill: 1 - omeprazole (PRILOSEC) 40 MG capsule; Take 1 capsule (40 mg total) by mouth every morning.  Dispense: 90 capsule; Refill: 1  8. Long-term use of high-risk medication  - COMPLETE METABOLIC PANEL WITH GFR

## 2017-10-26 LAB — COMPLETE METABOLIC PANEL WITH GFR
AG RATIO: 1.9 (calc) (ref 1.0–2.5)
ALBUMIN MSPROF: 4.3 g/dL (ref 3.6–5.1)
ALKALINE PHOSPHATASE (APISO): 81 U/L (ref 33–130)
ALT: 9 U/L (ref 6–29)
AST: 18 U/L (ref 10–35)
BILIRUBIN TOTAL: 0.3 mg/dL (ref 0.2–1.2)
BUN: 15 mg/dL (ref 7–25)
CHLORIDE: 100 mmol/L (ref 98–110)
CO2: 28 mmol/L (ref 20–32)
Calcium: 9.9 mg/dL (ref 8.6–10.4)
Creat: 0.7 mg/dL (ref 0.60–0.93)
GFR, EST AFRICAN AMERICAN: 99 mL/min/{1.73_m2} (ref >=60)
GFR, Est Non African American: 85 mL/min/{1.73_m2} (ref >=60)
GLOBULIN: 2.3 g/dL (ref 1.9–3.7)
Glucose, Bld: 82 mg/dL (ref 65–139)
POTASSIUM: 4.4 mmol/L (ref 3.5–5.3)
SODIUM: 137 mmol/L (ref 135–146)
TOTAL PROTEIN: 6.6 g/dL (ref 6.1–8.1)

## 2017-10-26 LAB — LIPID PANEL
CHOLESTEROL: 225 mg/dL — AB (ref <200)
HDL: 79 mg/dL (ref >50)
LDL Cholesterol (Calc): 130 mg/dL (calc) — ABNORMAL HIGH
Non-HDL Cholesterol (Calc): 146 mg/dL (calc) — ABNORMAL HIGH (ref <130)
Total CHOL/HDL Ratio: 2.8 (calc) (ref <5.0)
Triglycerides: 67 mg/dL (ref <150)

## 2017-10-26 LAB — TSH: TSH: 2.07 m[IU]/L (ref 0.40–4.50)

## 2017-10-26 LAB — VITAMIN B12: VITAMIN B 12: 484 pg/mL (ref 200–1100)

## 2017-10-26 LAB — VITAMIN D 25 HYDROXY (VIT D DEFICIENCY, FRACTURES): VIT D 25 HYDROXY: 38 ng/mL (ref 30–100)

## 2017-11-04 ENCOUNTER — Other Ambulatory Visit: Payer: Self-pay | Admitting: Internal Medicine

## 2017-11-04 DIAGNOSIS — M810 Age-related osteoporosis without current pathological fracture: Secondary | ICD-10-CM

## 2017-11-28 ENCOUNTER — Ambulatory Visit
Admission: RE | Admit: 2017-11-28 | Discharge: 2017-11-28 | Disposition: A | Discharge status: Home / Self Care | Payer: Medicare Other | Source: Ambulatory Visit | Attending: Internal Medicine | Admitting: Internal Medicine

## 2017-11-28 DIAGNOSIS — Z780 Asymptomatic menopausal state: Secondary | ICD-10-CM | POA: Diagnosis not present

## 2017-11-28 DIAGNOSIS — M810 Age-related osteoporosis without current pathological fracture: Secondary | ICD-10-CM

## 2017-12-16 DIAGNOSIS — L820 Inflamed seborrheic keratosis: Secondary | ICD-10-CM | POA: Diagnosis not present

## 2017-12-16 DIAGNOSIS — L812 Freckles: Secondary | ICD-10-CM | POA: Diagnosis not present

## 2017-12-27 ENCOUNTER — Other Ambulatory Visit: Payer: Self-pay | Admitting: Family Medicine

## 2017-12-27 DIAGNOSIS — Z1231 Encounter for screening mammogram for malignant neoplasm of breast: Secondary | ICD-10-CM

## 2018-01-06 ENCOUNTER — Ambulatory Visit
Admission: RE | Admit: 2018-01-06 | Discharge: 2018-01-06 | Disposition: A | Discharge status: Home / Self Care | Payer: Medicare Other | Source: Ambulatory Visit | Attending: Family Medicine | Admitting: Family Medicine

## 2018-01-06 DIAGNOSIS — Z1231 Encounter for screening mammogram for malignant neoplasm of breast: Secondary | ICD-10-CM

## 2018-01-09 ENCOUNTER — Other Ambulatory Visit: Payer: Self-pay | Admitting: Family Medicine

## 2018-01-09 DIAGNOSIS — E039 Hypothyroidism, unspecified: Secondary | ICD-10-CM

## 2018-02-02 DIAGNOSIS — Z23 Encounter for immunization: Secondary | ICD-10-CM | POA: Diagnosis not present

## 2018-04-06 ENCOUNTER — Telehealth: Payer: Self-pay

## 2018-04-06 NOTE — Telephone Encounter (Signed)
Called to inform patient about the recall letter that we received patient states that she had stopped taking Ranitidine and PCP had started her back on Omeprazole.

## 2018-04-24 DIAGNOSIS — H40003 Preglaucoma, unspecified, bilateral: Secondary | ICD-10-CM | POA: Diagnosis not present

## 2018-04-25 DIAGNOSIS — Z1283 Encounter for screening for malignant neoplasm of skin: Secondary | ICD-10-CM | POA: Diagnosis not present

## 2018-04-25 DIAGNOSIS — L578 Other skin changes due to chronic exposure to nonionizing radiation: Secondary | ICD-10-CM | POA: Diagnosis not present

## 2018-04-25 DIAGNOSIS — L812 Freckles: Secondary | ICD-10-CM | POA: Diagnosis not present

## 2018-04-25 DIAGNOSIS — D1800 Hemangioma unspecified site: Secondary | ICD-10-CM | POA: Diagnosis not present

## 2018-04-25 DIAGNOSIS — L570 Actinic keratosis: Secondary | ICD-10-CM | POA: Diagnosis not present

## 2018-04-25 DIAGNOSIS — L853 Xerosis cutis: Secondary | ICD-10-CM | POA: Diagnosis not present

## 2018-04-25 DIAGNOSIS — L820 Inflamed seborrheic keratosis: Secondary | ICD-10-CM | POA: Diagnosis not present

## 2018-04-25 DIAGNOSIS — L821 Other seborrheic keratosis: Secondary | ICD-10-CM | POA: Diagnosis not present

## 2018-04-25 DIAGNOSIS — Z85828 Personal history of other malignant neoplasm of skin: Secondary | ICD-10-CM | POA: Diagnosis not present

## 2018-04-25 DIAGNOSIS — D225 Melanocytic nevi of trunk: Secondary | ICD-10-CM | POA: Diagnosis not present

## 2018-04-28 ENCOUNTER — Encounter: Payer: Self-pay | Admitting: Family Medicine

## 2018-04-28 ENCOUNTER — Ambulatory Visit (INDEPENDENT_AMBULATORY_CARE_PROVIDER_SITE_OTHER): Payer: Medicare Other | Admitting: Family Medicine

## 2018-04-28 ENCOUNTER — Ambulatory Visit (INDEPENDENT_AMBULATORY_CARE_PROVIDER_SITE_OTHER): Payer: Medicare Other

## 2018-04-28 VITALS — BP 128/72 | HR 73 | Temp 97.8°F | Resp 16 | Ht 62.0 in | Wt 107.9 lb

## 2018-04-28 DIAGNOSIS — K219 Gastro-esophageal reflux disease without esophagitis: Secondary | ICD-10-CM | POA: Diagnosis not present

## 2018-04-28 DIAGNOSIS — E559 Vitamin D deficiency, unspecified: Secondary | ICD-10-CM | POA: Diagnosis not present

## 2018-04-28 DIAGNOSIS — E538 Deficiency of other specified B group vitamins: Secondary | ICD-10-CM | POA: Diagnosis not present

## 2018-04-28 DIAGNOSIS — D692 Other nonthrombocytopenic purpura: Secondary | ICD-10-CM | POA: Diagnosis not present

## 2018-04-28 DIAGNOSIS — Z0000 Encounter for general adult medical examination without abnormal findings: Secondary | ICD-10-CM | POA: Diagnosis not present

## 2018-04-28 DIAGNOSIS — M810 Age-related osteoporosis without current pathological fracture: Secondary | ICD-10-CM

## 2018-04-28 DIAGNOSIS — Z1211 Encounter for screening for malignant neoplasm of colon: Secondary | ICD-10-CM | POA: Diagnosis not present

## 2018-04-28 DIAGNOSIS — E039 Hypothyroidism, unspecified: Secondary | ICD-10-CM | POA: Diagnosis not present

## 2018-04-28 DIAGNOSIS — E785 Hyperlipidemia, unspecified: Secondary | ICD-10-CM

## 2018-04-28 MED ORDER — OMEPRAZOLE 40 MG PO CPDR: 40.0000 mg | DELAYED_RELEASE_CAPSULE | ORAL | 1 refills | Status: DC

## 2018-04-28 MED ORDER — PRAVASTATIN SODIUM 20 MG PO TABS: ORAL_TABLET | ORAL | 1 refills | Status: DC

## 2018-04-28 MED ORDER — LEVOTHYROXINE SODIUM 25 MCG PO TABS: ORAL_TABLET | ORAL | 1 refills | Status: DC

## 2018-04-28 NOTE — Progress Notes (Signed)
Subjective:   Heather Singh is a 73 y.o. female who presents for an Initial Medicare Annual Wellness Visit.  Review of Systems      Cardiac Risk Factors include: advanced age (>73men, >62 women);dyslipidemia     Objective:    Today's Vitals   04/28/18 1015  BP: 128/72  Pulse: 73  Resp: 16  Temp: 97.8 F (36.6 C)  TempSrc: Oral  SpO2: 99%  Weight: 107 lb 14.4 oz (48.9 kg)  Height: 5\' 2"  (1.575 m)   Body mass index is 19.74 kg/m.  Advanced Directives 04/28/2018 10/11/2016 09/21/2016 09/11/2016 08/16/2016 03/30/2016 10/22/2015  Does Patient Have a Medical Advance Directive? Yes Yes Yes Yes Yes Yes Yes  Type of Paramedic of [place];Living will [place];Living will [place];Living will - [place];Living will [place];Living will [place];Living will  Does patient want to make changes to medical advance directive? - - - - - - -  Copy of [place] in Chart? No - copy requested No - copy requested No - copy requested - - - No - copy requested    Current Medications (verified) Outpatient Encounter Medications as of 04/28/2018  Medication Sig  . calcium carbonate (TUMS EX) 750 MG chewable tablet Chew 1 tablet by mouth daily.  . Cholecalciferol (VITAMIN D) 2000 units CAPS Take 1 capsule by mouth daily.  . Insulin Pen Needle (COMFORT EZ PEN NEEDLES) 33G X 8 MM MISC Use 1 each once daily.  Marland Kitchen levothyroxine (SYNTHROID, LEVOTHROID) 25 MCG tablet TAKE 1 TABLET BY MOUTH EVERY OTHER DAY AND TAKE 2 TABLETS ON THE ALTERNATE DAYS  . loratadine (CLARITIN) 10 MG tablet Take 1 tablet by mouth daily.  . Magnesium 400 MG TABS Take 1 tablet by mouth daily.  Marland Kitchen omeprazole (PRILOSEC) 40 MG capsule Take 1 capsule (40 mg total) by mouth every morning.  . Polyethylene Glycol 400 (BLINK TEARS) 0.25 % SOLN Place 1 drop into both eyes 2 (two) times daily as needed (dry  eyes).  . pravastatin (PRAVACHOL) 20 MG tablet TAKE 1 TABLET(20 MG) BY MOUTH DAILY  . Teriparatide, Recombinant, (FORTEO) 600 MCG/2.4ML SOLN INJECT 0.08 ML (20 MCG) UNDER THE SKIN (SUBCUTANEOUS INJECTION) ONCE DAILY FOR 28 DAYS. DISCARD PEN 28 DAYS AFTER INITIAL USE.  . vitamin B-12 (CYANOCOBALAMIN) 1000 MCG tablet Take 1,000 mcg by mouth 3 (three) times a week.  . [DISCONTINUED] prednisoLONE acetate (PRED FORTE) 1 % ophthalmic suspension SHAKE LQ AND INT 1 GTT IN OU TID FOR 7 DAYS  . [DISCONTINUED] ranitidine (ZANTAC) 150 MG tablet Take 1 tablet (150 mg total) by mouth at bedtime.  . [DISCONTINUED] triamcinolone cream (KENALOG) 0.1 % Apply topically.   No facility-administered encounter medications on file as of 04/28/2018.     Allergies (verified) Alendronate; Levofloxacin; Nitrofurantoin monohyd macro; Sulfamethoxazole-trimethoprim; Etodolac; and Penicillins   History: Past Medical History:  Diagnosis Date  . Allergy   . Back pain   . Dyslipidemia   . Fibrocystic breast disease   . GERD (gastroesophageal reflux disease)   . Hyperlipidemia   . Menopause   . Osteoporosis   . Reflux esophagitis   . RLS (restless legs syndrome)   . Urinary dysfunction    Past Surgical History:  Procedure Laterality Date  . BREAST CYST ASPIRATION Left   . BREAST EXCISIONAL BIOPSY Left 1980's   neg  . CATARACT EXTRACTION    . EYE SURGERY  left  . KYPHOPLASTY N/A 08/19/2016   Procedure: KYPHOPLASTY L3,L4;  Surgeon: Hessie Knows, MD;  Location: [facility] ORS;  Service: Orthopedics;  Laterality: N/A;  . KYPHOPLASTY N/A 09/21/2016   Procedure: [id number];  Surgeon: Hessie Knows, MD;  Location: [facility] ORS;  Service: Orthopedics;  Laterality: N/A;  . MOUTH SURGERY    . POLYPECTOMY    . SLING BLADE PROCEDURE    . TOTAL ABDOMINAL HYSTERECTOMY     Family History  Problem Relation Age of Onset  . Transient ischemic attack Mother   . Atrial fibrillation Mother   . Arthritis Mother        OA  .  Heart disease Father        CHF  . CAD Father   . Hyperlipidemia Sister   . Arthritis Sister        OA  . Drug abuse Son   . Hypertension Son   . Stroke Sister   . Arthritis Sister        OA  . Heart failure Other   . Coronary artery disease Other   . Stroke Other   . Hyperlipidemia Other   . Hypertension Other   . Thyroid disease Other   . Breast cancer Maternal Aunt   . Breast cancer Paternal Aunt    Social History   Socioeconomic History  . Marital status: Married    Spouse name: Not on file  . Number of children: 1  . Years of education: Not on file  . Highest education level: High school graduate  Occupational History  . Not on file  Social Needs  . Financial resource strain: Not hard at all  . Food insecurity:    Worry: Never true    Inability: Never true  . Transportation needs:    Medical: No    Non-medical: No  Tobacco Use  . Smoking status: Never Smoker  . Smokeless tobacco: Never Used  Substance and Sexual Activity  . Alcohol use: No    Alcohol/week: 0.0 standard drinks  . Drug use: No  . Sexual activity: Yes    Partners: Male  Lifestyle  . Physical activity:    Days per week: 1 day    Minutes per session: 60 min  . Stress: Not at all  Relationships  . Social connections:    Talks on phone: More than three times a week    Gets together: More than three times a week    Attends religious service: More than 4 times per year    Active member of club or organization: No    Attends meetings of clubs or organizations: Never    Relationship status: Married  Other Topics Concern  . Not on file  Social History Narrative   Married   Gets regular exercise    Tobacco Counseling Counseling given: Not Answered   Clinical Intake:  Pre-visit preparation completed: Yes  Pain : No/denies pain     BMI - recorded: 19.74 Nutritional Status: BMI of 19-24  Normal Nutritional Risks: None Diabetes: No  How often do you need to have someone help you  when you read instructions, pamphlets, or other written materials from your doctor or pharmacy?: 1 - Never What is the last grade level you completed in school?: high school  Interpreter Needed?: No  Information entered by :: Clemetine Marker LPN   Activities of Daily Living In your present state of health, do you have any difficulty performing the following activities: 04/28/2018 10/25/2017  Hearing? N  N  Comment wears hearing aids -  Vision? N Y  Comment wears glasses -  Difficulty concentrating or making decisions? N N  Walking or climbing stairs? N N  Dressing or bathing? N N  Doing errands, shopping? N N  Preparing Food and eating ? N -  Using the Toilet? N -  In the past six months, have you accidently leaked urine? Y -  Comment wears pads for protection -  Do you have problems with loss of bowel control? N -  Managing your Medications? N -  Managing your Finances? N -  Housekeeping or managing your Housekeeping? N -  Some recent data might be hidden     Immunizations and Health Maintenance Immunization History  Administered Date(s) Administered  . Influenza Split 02/25/2009, 11/27/2012  . Influenza, High Dose Seasonal PF 11/13/2014, 11/17/2016  . Influenza, Seasonal, Injecte, Preservative Fre 12/01/2009, 12/30/2010, 11/19/2011, 12/20/2013  . Influenza,inj,Quad PF,6+ Mos 01/07/2016  . Influenza-Unspecified 02/02/2018  . Pneumococcal Conjugate-13 10/21/2014  . Pneumococcal Polysaccharide-23 07/29/2008  . Tdap 07/26/2007, 03/30/2016  . Zoster 02/25/2009  . Zoster Recombinat (Shingrix) 10/21/2017, 01/02/2018   Health Maintenance Due  Topic Date Due  . COLONOSCOPY  03/15/2018    Patient Care Team: Steele Sizer, MD as PCP - General (Family Medicine) Beverly Gust, MD as Consulting Physician (Otolaryngology) Gabriel Carina Betsey Holiday, MD as Consulting Physician (Endocrinology)  Indicate any recent Medical Services you may have received from other than [facility] providers in the  past year (date may be approximate).     Assessment:   This is a routine wellness examination for [place].  Hearing/Vision screen Hearing Screening Comments: Bilateral hearing aids maintained by [facility] Vision Screening Comments: Annual vision screenings with Dr. Thomasene Ripple  Dietary issues and exercise activities discussed: Current Exercise Habits: Structured exercise class, Type of exercise: stretching;Other - see comments(exercise class for core stregthening), Time (Minutes): 60, Frequency (Times/Week): 1, Weekly Exercise (Minutes/Week): 60, Intensity: Moderate, Exercise limited by: None identified  Goals    . DIET - INCREASE WATER INTAKE     Recommend drinking 6-8 glasses of water per day      Depression Screen PHQ 2/9 Scores 04/28/2018 10/25/2017 08/23/2017 04/27/2017 11/17/2016 03/30/2016 10/22/2015  PHQ - 2 Score 0 0 0 0 0 0 0    Fall Risk Fall Risk  04/28/2018 10/25/2017 08/23/2017 04/27/2017 11/17/2016  Falls in the past year? 0 No No No No  Number falls in past yr: 0 - - - -  Injury with Fall? 0 - - - -  Follow up Falls prevention discussed - - - -    FALL RISK PREVENTION PERTAINING TO THE HOME:  Any stairs in or around the home? Yes  If so, do they have handrails? Yes   Home free of loose throw rugs in walkways, pet beds, electrical cords, etc? Yes  Adequate lighting in your home to reduce risk of falls? Yes   ASSISTIVE DEVICES UTILIZED TO PREVENT FALLS:  Life alert? No  Use of a cane, walker or w/c? No  Grab bars in the bathroom? No  Shower chair or bench in shower? No Elevated toilet seat or a handicapped toilet? Yes   DME ORDERS:  DME order needed?  No   TIMED UP AND GO:  Was the test performed? Yes .  Length of time to ambulate 10 feet: 5 sec.   GAIT:  Appearance of gait: Gait stead-fast and without the use of an assistive device.  Education: Fall risk prevention has been  discussed.  Intervention(s) required? No   Cognitive Function:     6CIT Screen  04/28/2018  What Year? 0 points  What month? 0 points  What time? 0 points  Count back from 20 0 points  Months in reverse 0 points  Repeat phrase 0 points  Total Score 0    Screening Tests Health Maintenance  Topic Date Due  . COLONOSCOPY  03/15/2018  . MAMMOGRAM  01/07/2020  . TETANUS/TDAP  03/30/2026  . INFLUENZA VACCINE  Completed  . DEXA SCAN  Completed  . Hepatitis C Screening  Completed  . PNA vac Low Risk Adult  Completed  . COLON CANCER SCREENING ANNUAL FOBT  Discontinued    Qualifies for Shingles Vaccine? Shingrix series complete.  Tdap: Up to date  Flu Vaccine: Up to date  Pneumococcal Vaccine: Up to date   Cancer Screenings:  Colorectal Screening: Completed 2015. Repeat every 5 years;  Referral to GI placed today to Dr[facility] Vira Agar [facility] per patient request. Pt aware the office will call re: appt.  Mammogram: Completed 01/06/18. Repeat every year.  Bone Density: Completed 11/28/17. Results reflect  OSTEOPOROSIS. Repeat every 2 years. Managed by endocrinology.  Lung Cancer Screening: (Low Dose CT Chest recommended if Age 22-80 years, 30 pack-year currently smoking OR have quit w/in 15years.) does not qualify.   Additional Screening:  Hepatitis C Screening: does qualify; Completed 12/28/11  Vision Screening: Recommended annual ophthalmology exams for early detection of glaucoma and other disorders of the eye. Is the patient up to date with their annual eye exam?  Yes  Who is the provider or what is the name of the office in which the pt attends annual eye exams? Dr. Thomasene Ripple   Dental Screening: Recommended annual dental exams for proper oral hygiene  Community Resource Referral:  CRR required this visit?  No      Plan:     I have personally reviewed and addressed the Medicare Annual Wellness questionnaire and have noted the following in the patient's chart:  A. Medical and social history B. Use of alcohol, tobacco or illicit drugs    C. Current medications and supplements D. Functional ability and status E.  Nutritional status F.  Physical activity G. Advance directives H. List of other physicians I.  Hospitalizations, surgeries, and ER visits in previous 12 months J.  [place] such as hearing and vision if needed, cognitive and depression L. Referrals and appointments   In addition, I have reviewed and discussed with patient certain preventive protocols, quality metrics, and best practice recommendations. A written personalized care plan for preventive services as well as general preventive health recommendations were provided to patient.   Signed,  Clemetine Marker, LPN Nurse Health Advisor   Nurse Notes: pt doing well overall and appreciative of visit. Same day appt with Dr. Ancil Boozer.

## 2018-04-28 NOTE — Patient Instructions (Addendum)
Heather Singh , Thank you for taking time to come for your Medicare Wellness Visit. I appreciate your ongoing commitment to your health goals. Please review the following plan we discussed and let me know if I can assist you in the future.   Screening recommendations/referrals: Colonoscopy: Done 2015. Referral to gastroenterology to Dr. Vira Agar placed today. They will contact you to set up an appointment.  Mammogram: done 01/06/18 Bone Density: done 11/28/17 Recommended yearly ophthalmology/optometry visit for glaucoma screening and checkup Recommended yearly dental visit for hygiene and checkup  Vaccinations: Influenza vaccine: done 02/02/18 Pneumococcal vaccine: done 10/21/14 Tdap vaccine: done 03/30/16 Shingles vaccine: Shingrix completed 01/02/2018    Advanced directives: Please bring a copy of your health care power of attorney and living will to the office at your convenience.  Conditions/risks identified: Recommend drinking 6-8 glasses of water per day.   Next appointment: Please follow up in one year for your Medicare Annual Wellness visit.     Preventive Care 8 Years and Older, Female Preventive care refers to lifestyle choices and visits with your health care provider that can promote health and wellness. What does preventive care include?  A yearly physical exam. This is also called an annual well check.  Dental exams once or twice a year.  Routine eye exams. Ask your health care provider how often you should have your eyes checked.  Personal lifestyle choices, including:  Daily care of your teeth and gums.  Regular physical activity.  Eating a healthy diet.  Avoiding tobacco and drug use.  Limiting alcohol use.  Practicing safe sex.  Taking low-dose aspirin every day.  Taking vitamin and mineral supplements as recommended by your health care provider. What happens during an annual well check? The services and screenings done by your health care provider  during your annual well check will depend on your age, overall health, lifestyle risk factors, and family history of disease. Counseling  Your health care provider may ask you questions about your:  Alcohol use.  Tobacco use.  Drug use.  Emotional well-being.  Home and relationship well-being.  Sexual activity.  Eating habits.  History of falls.  Memory and ability to understand (cognition).  Work and work Statistician.  Reproductive health. Screening  You may have the following tests or measurements:  Height, weight, and BMI.  Blood pressure.  Lipid and cholesterol levels. These may be checked every 5 years, or more frequently if you are over 21 years old.  Skin check.  Lung cancer screening. You may have this screening every year starting at age 36 if you have a 30-pack-year history of smoking and currently smoke or have quit within the past 15 years.  Fecal occult blood test (FOBT) of the stool. You may have this test every year starting at age 86.  Flexible sigmoidoscopy or colonoscopy. You may have a sigmoidoscopy every 5 years or a colonoscopy every 10 years starting at age 1.  Hepatitis C blood test.  Hepatitis B blood test.  Sexually transmitted disease (STD) testing.  Diabetes screening. This is done by checking your blood sugar (glucose) after you have not eaten for a while (fasting). You may have this done every 1-3 years.  Bone density scan. This is done to screen for osteoporosis. You may have this done starting at age 35.  Mammogram. This may be done every 1-2 years. Talk to your health care provider about how often you should have regular mammograms. Talk with your health care provider about your test  results, treatment options, and if necessary, the need for more tests. Vaccines  Your health care provider may recommend certain vaccines, such as:  Influenza vaccine. This is recommended every year.  Tetanus, diphtheria, and acellular pertussis  (Tdap, Td) vaccine. You may need a Td booster every 10 years.  Zoster vaccine. You may need this after age 58.  Pneumococcal 13-valent conjugate (PCV13) vaccine. One dose is recommended after age 57.  Pneumococcal polysaccharide (PPSV23) vaccine. One dose is recommended after age 45. Talk to your health care provider about which screenings and vaccines you need and how often you need them. This information is not intended to replace advice given to you by your health care provider. Make sure you discuss any questions you have with your health care provider. Document Released: 03/28/2015 Document Revised: 11/19/2015 Document Reviewed: 12/31/2014 Elsevier Interactive Patient Education  2017 [place] Prevention in the Home Falls can cause injuries. They can happen to people of all ages. There are many things you can do to make your home safe and to help prevent falls. What can I do on the outside of my home?  Regularly fix the edges of walkways and driveways and fix any cracks.  Remove anything that might make you trip as you walk through a door, such as a raised step or threshold.  Trim any bushes or trees on the path to your home.  Use bright outdoor lighting.  Clear any walking paths of anything that might make someone trip, such as rocks or tools.  Regularly check to see if handrails are loose or broken. Make sure that both sides of any steps have handrails.  Any raised decks and porches should have guardrails on the edges.  Have any leaves, snow, or ice cleared regularly.  Use sand or salt on walking paths during winter.  Clean up any spills in your garage right away. This includes oil or grease spills. What can I do in the bathroom?  Use night lights.  Install grab bars by the toilet and in the tub and shower. Do not use towel bars as grab bars.  Use non-skid mats or decals in the tub or shower.  If you need to sit down in the shower, use a plastic, non-slip  stool.  Keep the floor dry. Clean up any water that spills on the floor as soon as it happens.  Remove soap buildup in the tub or shower regularly.  Attach bath mats securely with double-sided non-slip rug tape.  Do not have throw rugs and other things on the floor that can make you trip. What can I do in the bedroom?  Use night lights.  Make sure that you have a light by your bed that is easy to reach.  Do not use any sheets or blankets that are too big for your bed. They should not hang down onto the floor.  Have a firm chair that has side arms. You can use this for support while you get dressed.  Do not have throw rugs and other things on the floor that can make you trip. What can I do in the kitchen?  Clean up any spills right away.  Avoid walking on wet floors.  Keep items that you use a lot in easy-to-reach places.  If you need to reach something above you, use a strong step stool that has a grab bar.  Keep electrical cords out of the way.  Do not use floor polish or wax that makes  floors slippery. If you must use wax, use non-skid floor wax.  Do not have throw rugs and other things on the floor that can make you trip. What can I do with my stairs?  Do not leave any items on the stairs.  Make sure that there are handrails on both sides of the stairs and use them. Fix handrails that are broken or loose. Make sure that handrails are as long as the stairways.  Check any carpeting to make sure that it is firmly attached to the stairs. Fix any carpet that is loose or worn.  Avoid having throw rugs at the top or bottom of the stairs. If you do have throw rugs, attach them to the floor with carpet tape.  Make sure that you have a light switch at the top of the stairs and the bottom of the stairs. If you do not have them, ask someone to add them for you. What else can I do to help prevent falls?  Wear shoes that:  Do not have high heels.  Have rubber bottoms.  Are  comfortable and fit you well.  Are closed at the toe. Do not wear sandals.  If you use a stepladder:  Make sure that it is fully opened. Do not climb a closed stepladder.  Make sure that both sides of the stepladder are locked into place.  Ask someone to hold it for you, if possible.  Clearly mark and make sure that you can see:  Any grab bars or handrails.  First and last steps.  Where the edge of each step is.  Use tools that help you move around (mobility aids) if they are needed. These include:  Canes.  Walkers.  Scooters.  Crutches.  Turn on the lights when you go into a dark area. Replace any light bulbs as soon as they burn out.  Set up your furniture so you have a clear path. Avoid moving your furniture around.  If any of your floors are uneven, fix them.  If there are any pets around you, be aware of where they are.  Review your medicines with your doctor. Some medicines can make you feel dizzy. This can increase your chance of falling. Ask your doctor what other things that you can do to help prevent falls. This information is not intended to replace advice given to you by your health care provider. Make sure you discuss any questions you have with your health care provider. Document Released: 12/26/2008 Document Revised: 08/07/2015 Document Reviewed: 04/05/2014 Elsevier Interactive Patient Education  2017 [employer].

## 2018-04-28 NOTE — Progress Notes (Signed)
Name: Heather Singh   MRN: [id number]    DOB: 12-Nov-1945   Date:04/28/2018       Progress Note  Subjective  Chief Complaint  Chief Complaint  Patient presents with  . Hypothyroidism    well controlled  . Hyperlipidemia  . Osteoporosis  . Medication Refill    6 month F/U-Needs refill on all medications  . Gastroesophageal Reflux    controlled with daily use of medication    HPI  Osteoporosis: she has been seeing Dr. Tereasa Coop started 10/2016. She is tolerating medication well, she had a bone density done by Dr. Gabriel Carina 11/2017. She states still has some pain on her back when she stands for a long time. She is exercising and having better posture.   GERD: Has been seeing Dr. Tami Ribas who Rx'd Omeprazole 40mg . Says symptoms have been well controlled at this dose. . Still has a dry cough, no heartburn, very seldom has mild regurgitation  Hypothyroidism:she is taking levothyroxine as recommended. Last TSH was 10/2017 and at goal. She has been going to the gym and being more consistent with physical activity, feeling stronger  Hyperlipidemia:TakingPravachol. Noside effectsfrom medications, no joint/muscle aches.Unchanged.  Chronic constipation: doing well on Miralax , almost daily, Bristol scale 4 when she takes medication, she skips doses and Sherian Maroon goes to a 1. Unchanged.    Patient Active Problem List   Diagnosis Date Noted  . Closed compression fracture of L3 lumbar vertebra, sequela 08/23/2016  . Atrophic vaginitis 06/05/2015  . Acquired hypothyroidism 10/19/2014  . Perennial allergic rhinitis 10/19/2014  . B12 deficiency 10/19/2014  . Fibrocystic breast disease 10/19/2014  . Gastro-esophageal reflux disease without esophagitis 10/19/2014  . OP (osteoporosis) 10/19/2014  . Ovarian failure 10/19/2014  . Hiatal hernia 10/19/2014  . Restless legs syndrome 10/19/2014  . ABNORMAL EKG 09/03/2009  . Dyslipidemia 08/23/2008  . Hyperlipidemia 08/23/2008  . Vitamin D  deficiency 10/10/2007    Past Surgical History:  Procedure Laterality Date  . BREAST CYST ASPIRATION Left   . BREAST EXCISIONAL BIOPSY Left 1980's   neg  . CATARACT EXTRACTION    . EYE SURGERY     left  . KYPHOPLASTY N/A 08/19/2016   Procedure: KYPHOPLASTY L3,L4;  Surgeon: Hessie Knows, MD;  Location: [facility] ORS;  Service: Orthopedics;  Laterality: N/A;  . KYPHOPLASTY N/A 09/21/2016   Procedure: [id number];  Surgeon: Hessie Knows, MD;  Location: [facility] ORS;  Service: Orthopedics;  Laterality: N/A;  . MOUTH SURGERY    . POLYPECTOMY    . SLING BLADE PROCEDURE    . TOTAL ABDOMINAL HYSTERECTOMY      Family History  Problem Relation Age of Onset  . Transient ischemic attack Mother   . Atrial fibrillation Mother   . Arthritis Mother        OA  . Heart disease Father        CHF  . CAD Father   . Hyperlipidemia Sister   . Arthritis Sister        OA  . Drug abuse Son   . Hypertension Son   . Stroke Sister   . Arthritis Sister        OA  . Heart failure Other   . Coronary artery disease Other   . Stroke Other   . Hyperlipidemia Other   . Hypertension Other   . Thyroid disease Other   . Breast cancer Maternal Aunt   . Breast cancer Paternal Aunt     Social History   Socioeconomic History  .  Marital status: Married    Spouse name: Not on file  . Number of children: 1  . Years of education: Not on file  . Highest education level: High school graduate  Occupational History  . Not on file  Social Needs  . Financial resource strain: Not hard at all  . Food insecurity:    Worry: Never true    Inability: Never true  . Transportation needs:    Medical: No    Non-medical: No  Tobacco Use  . Smoking status: Never Smoker  . Smokeless tobacco: Never Used  Substance and Sexual Activity  . Alcohol use: No    Alcohol/week: 0.0 standard drinks  . Drug use: No  . Sexual activity: Yes    Partners: Male  Lifestyle  . Physical activity:    Days per week: 1 day    Minutes  per session: 60 min  . Stress: Not at all  Relationships  . Social connections:    Talks on phone: More than three times a week    Gets together: More than three times a week    Attends religious service: More than 4 times per year    Active member of club or organization: No    Attends meetings of clubs or organizations: Never    Relationship status: Married  . Intimate partner violence:    Fear of current or ex partner: No    Emotionally abused: No    Physically abused: No    Forced sexual activity: No  Other Topics Concern  . Not on file  Social History Narrative   Married   Gets regular exercise     Current Outpatient Medications:  .  calcium carbonate (TUMS EX) 750 MG chewable tablet, Chew 1 tablet by mouth daily., Disp: , Rfl:  .  Cholecalciferol (VITAMIN D) 2000 units CAPS, Take 1 capsule by mouth daily., Disp: , Rfl:  .  Insulin Pen Needle (COMFORT EZ PEN NEEDLES) 33G X 8 MM MISC, Use 1 each once daily., Disp: , Rfl:  .  levothyroxine (SYNTHROID, LEVOTHROID) 25 MCG tablet, TAKE 1 TABLET BY MOUTH EVERY OTHER DAY AND TAKE 2 TABLETS ON THE ALTERNATE DAYS, Disp: 129 tablet, Rfl: 0 .  loratadine (CLARITIN) 10 MG tablet, Take 1 tablet by mouth daily., Disp: , Rfl:  .  Magnesium 400 MG TABS, Take 1 tablet by mouth daily., Disp: , Rfl:  .  omeprazole (PRILOSEC) 40 MG capsule, Take 1 capsule (40 mg total) by mouth every morning., Disp: 90 capsule, Rfl: 1 .  Polyethylene Glycol 400 (BLINK TEARS) 0.25 % SOLN, Place 1 drop into both eyes 2 (two) times daily as needed (dry eyes)., Disp: , Rfl:  .  pravastatin (PRAVACHOL) 20 MG tablet, TAKE 1 TABLET(20 MG) BY MOUTH DAILY, Disp: 90 tablet, Rfl: 1 .  Teriparatide, Recombinant, (FORTEO) 600 MCG/2.4ML SOLN, INJECT 0.08 ML (20 MCG) UNDER THE SKIN (SUBCUTANEOUS INJECTION) ONCE DAILY FOR 28 DAYS. DISCARD PEN 28 DAYS AFTER INITIAL USE., Disp: , Rfl:  .  vitamin B-12 (CYANOCOBALAMIN) 1000 MCG tablet, Take 1,000 mcg by mouth 3 (three) times a  week., Disp: , Rfl:   Allergies  Allergen Reactions  . Alendronate     Dyspepsia, inflamed esophagus   . Levofloxacin Hives  . Nitrofurantoin Monohyd Macro Hives  . Sulfamethoxazole-Trimethoprim Hives  . Etodolac Hives  . Penicillins Hives and Rash    Has patient had a PCN reaction causing immediate rash, facial/tongue/throat swelling, SOB or lightheadedness with hypotension: No Has patient  had a PCN reaction causing severe rash involving mucus membranes or skin necrosis: No Has patient had a PCN reaction that required hospitalization: No Has patient had a PCN reaction occurring within the last 10 years: No If all of the above answers are "NO", then may proceed with Cephalosporin use.     I personally reviewed active problem list, medication list, allergies, family history, social history with the patient/caregiver today.   ROS  Constitutional: Negative for fever or weight change.  Respiratory: Negative for cough and shortness of breath.   Cardiovascular: Negative for chest pain or palpitations.  Gastrointestinal: Negative for abdominal pain, no bowel changes.  Musculoskeletal: Negative for gait problem or joint swelling.  Skin: Negative for rash.  Neurological: Negative for dizziness or headache.  No other specific complaints in a complete review of systems (except as listed in HPI above).  Objective  Vitals:   04/28/18 1029  BP: 128/72  Pulse: 73  Resp: 16  Temp: 97.8 F (36.6 C)  TempSrc: Oral  SpO2: 99%  Weight: 107 lb 14.4 oz (48.9 kg)  Height: 5\' 2"  (1.575 m)    Body mass index is 19.74 kg/m.  Physical Exam  Constitutional: Patient appears well-developed and well-nourished.  No distress.  HEENT: head atraumatic, normocephalic, pupils equal and reactive to light,  neck supple, throat within normal limits Skin: senile purpura on arms  Cardiovascular: Normal rate, regular rhythm and normal heart sounds.  No murmur heard. No BLE edema. Pulmonary/Chest:  Effort normal and breath sounds normal. No respiratory distress. Abdominal: Soft.  There is no tenderness. Psychiatric: Patient has a normal mood and affect. behavior is normal. Judgment and thought content normal.  PHQ2/9: Depression screen [facility] 2/9 04/28/2018 10/25/2017 08/23/2017 04/27/2017 11/17/2016  Decreased Interest 0 0 0 0 0  Down, Depressed, Hopeless 0 0 0 0 0  PHQ - 2 Score 0 0 0 0 0     Fall Risk: Fall Risk  04/28/2018 04/28/2018 10/25/2017 08/23/2017 04/27/2017  Falls in the past year? 0 0 No No No  Number falls in past yr: 0 0 - - -  Injury with Fall? 0 0 - - -  Follow up - Falls prevention discussed - - -      Assessment & Plan  1. Senile purpura ([place])  Reassurance   2. Encounter for screening colonoscopy  Referral placed to GI  3. Age-related osteoporosis without current pathological fracture   4. Acquired hypothyroidism  - levothyroxine (SYNTHROID, LEVOTHROID) 25 MCG tablet; Take one M,W,F and two other days of wk  Dispense: 129 tablet; Refill: 1  5. Vitamin D deficiency  Continue otc supplementation   6. B12 deficiency  Continue otc supplementation   7. Dyslipidemia  - pravastatin (PRAVACHOL) 20 MG tablet; TAKE 1 TABLET(20 MG) BY MOUTH DAILY  Dispense: 90 tablet; Refill: 1  8. Gastro-esophageal reflux disease without esophagitis  - omeprazole (PRILOSEC) 40 MG capsule; Take 1 capsule (40 mg total) by mouth every morning.  Dispense: 90 capsule; Refill: 1

## 2018-05-02 ENCOUNTER — Other Ambulatory Visit: Payer: Self-pay | Admitting: Family Medicine

## 2018-05-02 DIAGNOSIS — K219 Gastro-esophageal reflux disease without esophagitis: Secondary | ICD-10-CM

## 2018-05-19 DIAGNOSIS — K219 Gastro-esophageal reflux disease without esophagitis: Secondary | ICD-10-CM | POA: Diagnosis not present

## 2018-05-19 DIAGNOSIS — Z86010 Personal history of colonic polyps: Secondary | ICD-10-CM | POA: Diagnosis not present

## 2018-05-30 DIAGNOSIS — D126 Benign neoplasm of colon, unspecified: Secondary | ICD-10-CM | POA: Diagnosis not present

## 2018-05-30 DIAGNOSIS — K640 First degree hemorrhoids: Secondary | ICD-10-CM | POA: Diagnosis not present

## 2018-05-30 DIAGNOSIS — K5730 Diverticulosis of large intestine without perforation or abscess without bleeding: Secondary | ICD-10-CM | POA: Diagnosis not present

## 2018-05-30 DIAGNOSIS — Z86010 Personal history of colonic polyps: Secondary | ICD-10-CM | POA: Diagnosis not present

## 2018-05-30 DIAGNOSIS — Z1211 Encounter for screening for malignant neoplasm of colon: Secondary | ICD-10-CM | POA: Diagnosis not present

## 2018-05-30 DIAGNOSIS — D123 Benign neoplasm of transverse colon: Secondary | ICD-10-CM | POA: Diagnosis not present

## 2018-05-30 DIAGNOSIS — K635 Polyp of colon: Secondary | ICD-10-CM | POA: Diagnosis not present

## 2018-06-20 ENCOUNTER — Encounter: Payer: Self-pay | Admitting: Family Medicine

## 2018-07-03 ENCOUNTER — Other Ambulatory Visit: Payer: Self-pay | Admitting: Family Medicine

## 2018-07-03 DIAGNOSIS — E039 Hypothyroidism, unspecified: Secondary | ICD-10-CM

## 2018-07-03 NOTE — Telephone Encounter (Signed)
Refill request for thyroid medication. Levothyroxine   Last visit: 04/28/2018   Lab Results  Component Value Date   TSH 2.07 10/25/2017     Follow up on 10/27/2018

## 2018-07-31 ENCOUNTER — Encounter: Payer: Self-pay | Admitting: Family Medicine

## 2018-07-31 ENCOUNTER — Ambulatory Visit (INDEPENDENT_AMBULATORY_CARE_PROVIDER_SITE_OTHER): Payer: Medicare Other | Admitting: Family Medicine

## 2018-07-31 DIAGNOSIS — R21 Rash and other nonspecific skin eruption: Secondary | ICD-10-CM

## 2018-07-31 DIAGNOSIS — R42 Dizziness and giddiness: Secondary | ICD-10-CM | POA: Diagnosis not present

## 2018-07-31 MED ORDER — HYDROXYZINE HCL 10 MG PO TABS: 10.0000 mg | ORAL_TABLET | Freq: Four times a day (QID) | ORAL | 0 refills | Status: DC | PRN

## 2018-07-31 NOTE — Progress Notes (Signed)
Name: Heather Singh   MRN: [id number]    DOB: 16-Jun-1945   Date:07/31/2018       Progress Note  Subjective  Chief Complaint  Chief Complaint  Patient presents with  . Rash    Onset- 2 weeks, Bilateral ankles and has traveled up her legs and now to both her arms  . Fever    Low-grade fever on May 4th.    I connected with  Cristy Friedlander  on 07/31/18 at  1:20 PM EDT by a video enabled telemedicine application and verified that I am speaking with the correct person using two identifiers.  I discussed the limitations of evaluation and management by telemedicine and the availability of in person appointments. The patient expressed understanding and agreed to proceed. Staff also discussed with the patient that there may be a patient responsible charge related to this service. Patient Location: at home  Provider Location: [facility]   HPI  Rash: she states she woke up two weeks ago she woke up in the middle of the night, she was hot to touch ( they did not check her temperature) , she also felt nauseated and vomited the following morning. She staes symptoms lasted only for one night and after that she states difficulty focusing with her eyes for two day ( like if the room was spinning ) , symptoms resolved but a few days later she noticed a rash initially on her ankles, but is now on her arms and flank. The rash is pruriginous. Starts as a very small dot, she states more splotchy now. She has been applying fluocinonide and it helps with itching and the rash fades a little. She states she is concerned because of COVID-19 . She denies cough, sob, chest pain, change in hygiene products or hearing loss    Patient Active Problem List   Diagnosis Date Noted  . Senile purpura ([place]) 04/28/2018  . Closed compression fracture of L3 lumbar vertebra, sequela 08/23/2016  . Atrophic vaginitis 06/05/2015  . Acquired hypothyroidism 10/19/2014  . Perennial allergic rhinitis 10/19/2014   . B12 deficiency 10/19/2014  . Fibrocystic breast disease 10/19/2014  . Gastro-esophageal reflux disease without esophagitis 10/19/2014  . OP (osteoporosis) 10/19/2014  . Ovarian failure 10/19/2014  . Hiatal hernia 10/19/2014  . Restless legs syndrome 10/19/2014  . ABNORMAL EKG 09/03/2009  . Dyslipidemia 08/23/2008  . Hyperlipidemia 08/23/2008  . Vitamin D deficiency 10/10/2007    Past Surgical History:  Procedure Laterality Date  . BREAST CYST ASPIRATION Left   . BREAST EXCISIONAL BIOPSY Left 1980's   neg  . CATARACT EXTRACTION    . EYE SURGERY     left  . KYPHOPLASTY N/A 08/19/2016   Procedure: KYPHOPLASTY L3,L4;  Surgeon: Hessie Knows, MD;  Location: [facility] ORS;  Service: Orthopedics;  Laterality: N/A;  . KYPHOPLASTY N/A 09/21/2016   Procedure: [id number];  Surgeon: Hessie Knows, MD;  Location: [facility] ORS;  Service: Orthopedics;  Laterality: N/A;  . MOUTH SURGERY    . POLYPECTOMY    . SLING BLADE PROCEDURE    . TOTAL ABDOMINAL HYSTERECTOMY      Family History  Problem Relation Age of Onset  . Transient ischemic attack Mother   . Atrial fibrillation Mother   . Arthritis Mother        OA  . Heart disease Father        CHF  . CAD Father   . Hyperlipidemia Sister   . Arthritis Sister  OA  . Drug abuse Son   . Hypertension Son   . Stroke Sister   . Arthritis Sister        OA  . Heart failure Other   . Coronary artery disease Other   . Stroke Other   . Hyperlipidemia Other   . Hypertension Other   . Thyroid disease Other   . Breast cancer Maternal Aunt   . Breast cancer Paternal Aunt     Social History   Socioeconomic History  . Marital status: Married    Spouse name: Not on file  . Number of children: 1  . Years of education: Not on file  . Highest education level: High school graduate  Occupational History  . Not on file  Social Needs  . Financial resource strain: Not hard at all  . Food insecurity:    Worry: Never true    Inability: Never  true  . Transportation needs:    Medical: No    Non-medical: No  Tobacco Use  . Smoking status: Never Smoker  . Smokeless tobacco: Never Used  Substance and Sexual Activity  . Alcohol use: No    Alcohol/week: 0.0 standard drinks  . Drug use: No  . Sexual activity: Yes    Partners: Male  Lifestyle  . Physical activity:    Days per week: 1 day    Minutes per session: 60 min  . Stress: Not at all  Relationships  . Social connections:    Talks on phone: More than three times a week    Gets together: More than three times a week    Attends religious service: More than 4 times per year    Active member of club or organization: No    Attends meetings of clubs or organizations: Never    Relationship status: Married  . Intimate partner violence:    Fear of current or ex partner: No    Emotionally abused: No    Physically abused: No    Forced sexual activity: No  Other Topics Concern  . Not on file  Social History Narrative   Married   Gets regular exercise     Current Outpatient Medications:  .  calcium carbonate (TUMS EX) 750 MG chewable tablet, Chew 1 tablet by mouth daily., Disp: , Rfl:  .  Cholecalciferol (VITAMIN D) 2000 units CAPS, Take 1 capsule by mouth daily., Disp: , Rfl:  .  Insulin Pen Needle (COMFORT EZ PEN NEEDLES) 33G X 8 MM MISC, Use 1 each once daily., Disp: , Rfl:  .  levothyroxine (SYNTHROID) 25 MCG tablet, TAKE 1 TABLET BY MOUTH DAILY ALTERNATING WITH 2 TABLETS EVERY OTHER DAY, Disp: 135 tablet, Rfl: 0 .  loratadine (CLARITIN) 10 MG tablet, Take 1 tablet by mouth daily., Disp: , Rfl:  .  Magnesium 400 MG TABS, Take 1 tablet by mouth daily., Disp: , Rfl:  .  omeprazole (PRILOSEC) 40 MG capsule, Take 1 capsule (40 mg total) by mouth every morning., Disp: 90 capsule, Rfl: 1 .  Polyethylene Glycol 400 (BLINK TEARS) 0.25 % SOLN, Place 1 drop into both eyes 2 (two) times daily as needed (dry eyes)., Disp: , Rfl:  .  pravastatin (PRAVACHOL) 20 MG tablet, TAKE 1  TABLET(20 MG) BY MOUTH DAILY, Disp: 90 tablet, Rfl: 1 .  Teriparatide, Recombinant, (FORTEO) 600 MCG/2.4ML SOLN, INJECT 0.08 ML (20 MCG) UNDER THE SKIN (SUBCUTANEOUS INJECTION) ONCE DAILY FOR 28 DAYS. DISCARD PEN 28 DAYS AFTER INITIAL USE., Disp: , Rfl:  .  vitamin B-12 (CYANOCOBALAMIN) 1000 MCG tablet, Take 1,000 mcg by mouth 3 (three) times a week., Disp: , Rfl:   Allergies  Allergen Reactions  . Alendronate     Dyspepsia, inflamed esophagus   . Levofloxacin Hives  . Nitrofurantoin Monohyd Macro Hives  . Sulfamethoxazole-Trimethoprim Hives  . Etodolac Hives  . Penicillins Hives and Rash    Has patient had a PCN reaction causing immediate rash, facial/tongue/throat swelling, SOB or lightheadedness with hypotension: No Has patient had a PCN reaction causing severe rash involving mucus membranes or skin necrosis: No Has patient had a PCN reaction that required hospitalization: No Has patient had a PCN reaction occurring within the last 10 years: No If all of the above answers are "NO", then may proceed with Cephalosporin use.     I personally reviewed active problem list, medication list, allergies, family history with the patient/caregiver today.   ROS  Ten systems reviewed and is negative except as mentioned in HPI   Objective  Virtual encounter, vitals not obtained.  There is no height or weight on file to calculate BMI.  Physical Exam  Awake, alert and oriented Skin: unable to see the rash with camera, she states fading  PHQ2/9: Depression screen [facility] 2/9 07/31/2018 04/28/2018 10/25/2017 08/23/2017 04/27/2017  Decreased Interest 0 0 0 0 0  Down, Depressed, Hopeless 0 0 0 0 0  PHQ - 2 Score 0 0 0 0 0  Altered sleeping 0 - - - -  Tired, decreased energy 0 - - - -  Change in appetite 0 - - - -  Feeling bad or failure about yourself  0 - - - -  Trouble concentrating 0 - - - -  Moving slowly or fidgety/restless 0 - - - -  Suicidal thoughts 0 - - - -  PHQ-9 Score 0 - - - -   Difficult doing work/chores Not difficult at all - - - -   PHQ-2/9 Result is negative.    Fall Risk: Fall Risk  07/31/2018 04/28/2018 04/28/2018 10/25/2017 08/23/2017  Falls in the past year? 0 0 0 No No  Number falls in past yr: 0 0 0 - -  Injury with Fall? 0 0 0 - -  Follow up - - Falls prevention discussed - -     Assessment & Plan  1. Rash  We will try to control itching and may continue topical medication, dove soap and mild lotion  - hydrOXYzine (ATARAX/VISTARIL) 10 MG tablet; Take 1 tablet (10 mg total) by mouth every 6 (six) hours as needed.  Dispense: 60 tablet; Refill: 0  2. Vertigo  Possible BPPV, symptoms resolved now  I discussed the assessment and treatment plan with the patient. The patient was provided an opportunity to ask questions and all were answered. The patient agreed with the plan and demonstrated an understanding of the instructions.  The patient was advised to call back or seek an in-person evaluation if the symptoms worsen or if the condition fails to improve as anticipated.  I provided 15 minutes of non-face-to-face time during this encounter.

## 2018-08-16 DIAGNOSIS — L299 Pruritus, unspecified: Secondary | ICD-10-CM | POA: Diagnosis not present

## 2018-08-16 DIAGNOSIS — D1800 Hemangioma unspecified site: Secondary | ICD-10-CM | POA: Diagnosis not present

## 2018-09-08 DIAGNOSIS — L299 Pruritus, unspecified: Secondary | ICD-10-CM | POA: Diagnosis not present

## 2018-09-08 DIAGNOSIS — D225 Melanocytic nevi of trunk: Secondary | ICD-10-CM | POA: Diagnosis not present

## 2018-09-08 DIAGNOSIS — D1800 Hemangioma unspecified site: Secondary | ICD-10-CM | POA: Diagnosis not present

## 2018-09-08 DIAGNOSIS — L814 Other melanin hyperpigmentation: Secondary | ICD-10-CM | POA: Diagnosis not present

## 2018-09-08 DIAGNOSIS — L290 Pruritus ani: Secondary | ICD-10-CM | POA: Diagnosis not present

## 2018-10-02 ENCOUNTER — Other Ambulatory Visit: Payer: Self-pay | Admitting: Family Medicine

## 2018-10-02 DIAGNOSIS — E039 Hypothyroidism, unspecified: Secondary | ICD-10-CM

## 2018-10-27 ENCOUNTER — Other Ambulatory Visit: Payer: Self-pay

## 2018-10-27 ENCOUNTER — Other Ambulatory Visit: Payer: Self-pay | Admitting: Podiatry

## 2018-10-27 ENCOUNTER — Ambulatory Visit (INDEPENDENT_AMBULATORY_CARE_PROVIDER_SITE_OTHER): Payer: Medicare Other | Admitting: Family Medicine

## 2018-10-27 ENCOUNTER — Encounter: Payer: Self-pay | Admitting: Family Medicine

## 2018-10-27 ENCOUNTER — Ambulatory Visit (INDEPENDENT_AMBULATORY_CARE_PROVIDER_SITE_OTHER): Payer: Medicare Other | Admitting: Podiatry

## 2018-10-27 ENCOUNTER — Encounter: Payer: Self-pay | Admitting: Podiatry

## 2018-10-27 ENCOUNTER — Ambulatory Visit (INDEPENDENT_AMBULATORY_CARE_PROVIDER_SITE_OTHER): Payer: Medicare Other

## 2018-10-27 VITALS — Temp 98.3°F

## 2018-10-27 VITALS — BP 120/70 | HR 79 | Temp 97.5°F | Resp 16 | Ht 62.0 in | Wt 104.0 lb

## 2018-10-27 DIAGNOSIS — E785 Hyperlipidemia, unspecified: Secondary | ICD-10-CM

## 2018-10-27 DIAGNOSIS — M722 Plantar fascial fibromatosis: Secondary | ICD-10-CM

## 2018-10-27 DIAGNOSIS — E538 Deficiency of other specified B group vitamins: Secondary | ICD-10-CM

## 2018-10-27 DIAGNOSIS — I781 Nevus, non-neoplastic: Secondary | ICD-10-CM

## 2018-10-27 DIAGNOSIS — Z79899 Other long term (current) drug therapy: Secondary | ICD-10-CM | POA: Diagnosis not present

## 2018-10-27 DIAGNOSIS — E039 Hypothyroidism, unspecified: Secondary | ICD-10-CM

## 2018-10-27 DIAGNOSIS — D692 Other nonthrombocytopenic purpura: Secondary | ICD-10-CM | POA: Diagnosis not present

## 2018-10-27 DIAGNOSIS — E559 Vitamin D deficiency, unspecified: Secondary | ICD-10-CM

## 2018-10-27 DIAGNOSIS — D649 Anemia, unspecified: Secondary | ICD-10-CM | POA: Diagnosis not present

## 2018-10-27 DIAGNOSIS — K5909 Other constipation: Secondary | ICD-10-CM

## 2018-10-27 DIAGNOSIS — D1801 Hemangioma of skin and subcutaneous tissue: Secondary | ICD-10-CM

## 2018-10-27 DIAGNOSIS — K219 Gastro-esophageal reflux disease without esophagitis: Secondary | ICD-10-CM

## 2018-10-27 DIAGNOSIS — M810 Age-related osteoporosis without current pathological fracture: Secondary | ICD-10-CM | POA: Diagnosis not present

## 2018-10-27 DIAGNOSIS — M779 Enthesopathy, unspecified: Secondary | ICD-10-CM

## 2018-10-27 MED ORDER — PRAVASTATIN SODIUM 20 MG PO TABS: ORAL_TABLET | ORAL | 1 refills | Status: DC

## 2018-10-27 MED ORDER — OMEPRAZOLE 40 MG PO CPDR: 40.0000 mg | DELAYED_RELEASE_CAPSULE | ORAL | 1 refills | Status: DC

## 2018-10-27 MED ORDER — MELOXICAM 15 MG PO TABS: 15.0000 mg | ORAL_TABLET | Freq: Every day | ORAL | 1 refills | Status: DC

## 2018-10-27 NOTE — Progress Notes (Signed)
Name: Heather Singh   MRN: [id number]    DOB: 1945-07-19   Date:10/27/2018       Progress Note  Subjective  Chief Complaint  Chief Complaint  Patient presents with  . Hypothyroidism  . Hyperlipidemia  . Osteoporosis  . Gastroesophageal Reflux    HPI  Osteoporosis: she has been seeing Dr. Tereasa Coop started 10/2016. She is tolerating medication well, she had a bone density done by Dr. Gabriel Carina last year and she would like to have it repeated prior to next visit with Dr. Gabriel Carina  GERD and hiatal hernia : Has been seeing Dr. Tami Ribas and he advised  Omeprazole 40mg . Says symptoms have been well controlled at this dose. Still has a dry cough, no heartburn, she has regurgitation rarely after meals. She states she eats larger portions at times and it makes symptoms worse   Hypothyroidism:she is taking levothyroxine as recommended. Last TSH was 10/2017 and at goal. She has noticed some dry skin, more than usual lately  Cherry Angioma: she was having a lot of pruritis and was seen by a Dermatologist at [place] and was advised to stop hydroxyzine, use topical triamcinolone and is doing better since   Hyperlipidemia:TakingPravachol. Noside effectsfrom medications, no joint/muscle aches.We will recheck labs  Chronic constipation:doing well on Miralax , almost daily, Bristol scale 4 when she takes medication, she has been drinking 6-7 glasses of water daily    Patient Active Problem List   Diagnosis Date Noted  . Hx of adenomatous colonic polyps 05/19/2018  . Senile purpura ([place]) 04/28/2018  . Closed compression fracture of L3 lumbar vertebra, sequela 08/23/2016  . Atrophic vaginitis 06/05/2015  . Acquired hypothyroidism 10/19/2014  . Perennial allergic rhinitis 10/19/2014  . B12 deficiency 10/19/2014  . Fibrocystic breast disease 10/19/2014  . Gastro-esophageal reflux disease without esophagitis 10/19/2014  . OP (osteoporosis) 10/19/2014  . Ovarian failure 10/19/2014  .  Hiatal hernia 10/19/2014  . Restless legs syndrome 10/19/2014  . ABNORMAL EKG 09/03/2009  . Dyslipidemia 08/23/2008  . Hyperlipidemia 08/23/2008  . Vitamin D deficiency 10/10/2007    Past Surgical History:  Procedure Laterality Date  . BREAST CYST ASPIRATION Left   . BREAST EXCISIONAL BIOPSY Left 1980's   neg  . CATARACT EXTRACTION    . EYE SURGERY     left  . KYPHOPLASTY N/A 08/19/2016   Procedure: KYPHOPLASTY L3,L4;  Surgeon: Hessie Knows, MD;  Location: [facility] ORS;  Service: Orthopedics;  Laterality: N/A;  . KYPHOPLASTY N/A 09/21/2016   Procedure: [id number];  Surgeon: Hessie Knows, MD;  Location: [facility] ORS;  Service: Orthopedics;  Laterality: N/A;  . MOUTH SURGERY    . POLYPECTOMY    . SLING BLADE PROCEDURE    . TOTAL ABDOMINAL HYSTERECTOMY      Family History  Problem Relation Age of Onset  . Transient ischemic attack Mother   . Atrial fibrillation Mother   . Arthritis Mother        OA  . Heart disease Father        CHF  . CAD Father   . Hyperlipidemia Sister   . Arthritis Sister        OA  . Drug abuse Son   . Hypertension Son   . Stroke Sister   . Arthritis Sister        OA  . Heart failure Other   . Coronary artery disease Other   . Stroke Other   . Hyperlipidemia Other   . Hypertension Other   . Thyroid  disease Other   . Breast cancer Maternal Aunt   . Breast cancer Paternal Aunt     Social History   Socioeconomic History  . Marital status: Married    Spouse name: Not on file  . Number of children: 1  . Years of education: Not on file  . Highest education level: High school graduate  Occupational History  . Not on file  Social Needs  . Financial resource strain: Not hard at all  . Food insecurity    Worry: Never true    Inability: Never true  . Transportation needs    Medical: No    Non-medical: No  Tobacco Use  . Smoking status: Never Smoker  . Smokeless tobacco: Never Used  Substance and Sexual Activity  . Alcohol use: No     Alcohol/week: 0.0 standard drinks  . Drug use: No  . Sexual activity: Yes    Partners: Male  Lifestyle  . Physical activity    Days per week: 5 days    Minutes per session: 30 min  . Stress: Not at all  Relationships  . Social connections    Talks on phone: More than three times a week    Gets together: More than three times a week    Attends religious service: More than 4 times per year    Active member of club or organization: No    Attends meetings of clubs or organizations: Never    Relationship status: Married  . Intimate partner violence    Fear of current or ex partner: No    Emotionally abused: No    Physically abused: No    Forced sexual activity: No  Other Topics Concern  . Not on file  Social History Narrative   Married   Gets regular exercise     Current Outpatient Medications:  .  calcium carbonate (TUMS EX) 750 MG chewable tablet, Chew 1 tablet by mouth daily., Disp: , Rfl:  .  Cholecalciferol (VITAMIN D) 2000 units CAPS, Take 1 capsule by mouth daily., Disp: , Rfl:  .  Insulin Pen Needle (COMFORT EZ PEN NEEDLES) 33G X 8 MM MISC, Use 1 each once daily., Disp: , Rfl:  .  levothyroxine (SYNTHROID) 25 MCG tablet, TAKE 1 TABLET BY MOUTH EVERY DAY ALTERNATING WITH 2 TABLETS EVERY OTHER DAY, Disp: 135 tablet, Rfl: 0 .  loratadine (CLARITIN) 10 MG tablet, Take 1 tablet by mouth daily., Disp: , Rfl:  .  Magnesium 400 MG TABS, Take 1 tablet by mouth daily., Disp: , Rfl:  .  omeprazole (PRILOSEC) 40 MG capsule, Take 1 capsule (40 mg total) by mouth every morning., Disp: 90 capsule, Rfl: 1 .  Polyethylene Glycol 400 (BLINK TEARS) 0.25 % SOLN, Place 1 drop into both eyes 2 (two) times daily as needed (dry eyes)., Disp: , Rfl:  .  pravastatin (PRAVACHOL) 20 MG tablet, TAKE 1 TABLET(20 MG) BY MOUTH DAILY, Disp: 90 tablet, Rfl: 1 .  Teriparatide, Recombinant, 620 MCG/2.48ML SOPN, Inject into the skin., Disp: , Rfl:  .  triamcinolone cream (KENALOG) 0.1 %, , Disp: , Rfl:  .   vitamin B-12 (CYANOCOBALAMIN) 1000 MCG tablet, Take 1,000 mcg by mouth 3 (three) times a week., Disp: , Rfl:   Allergies  Allergen Reactions  . Alendronate     Dyspepsia, inflamed esophagus   . Levofloxacin Hives  . Nitrofurantoin Monohyd Macro Hives  . Sulfamethoxazole-Trimethoprim Hives  . Etodolac Hives  . Penicillins Hives and Rash    Has  patient had a PCN reaction causing immediate rash, facial/tongue/throat swelling, SOB or lightheadedness with hypotension: No Has patient had a PCN reaction causing severe rash involving mucus membranes or skin necrosis: No Has patient had a PCN reaction that required hospitalization: No Has patient had a PCN reaction occurring within the last 10 years: No If all of the above answers are "NO", then may proceed with Cephalosporin use.     I personally reviewed active problem list, medication list, allergies, family history, social history with the patient/caregiver today.   ROS  Constitutional: Negative for fever or weight change.  Respiratory: Negative for cough and shortness of breath.   Cardiovascular: Negative for chest pain or palpitations.  Gastrointestinal: Negative for abdominal pain, no bowel changes.  Musculoskeletal: Negative for gait problem or joint swelling.  Skin: Negative for rash.  Neurological: Negative for dizziness or headache.  No other specific complaints in a complete review of systems (except as listed in HPI above).  Objective  Vitals:   10/27/18 1011  BP: 120/70  Pulse: 79  Resp: 16  Temp: (!) 97.5 F (36.4 C)  TempSrc: Temporal  SpO2: 97%  Weight: 104 lb (47.2 kg)  Height: 5\' 2"  (1.575 m)    Body mass index is 19.02 kg/m.  Physical Exam  Constitutional: Patient appears well-developed and well-nourished. No distress.  HEENT: head atraumatic, normocephalic, pupils equal and reactive to light,  neck supple Cardiovascular: Normal rate, regular rhythm and normal heart sounds.  No murmur heard. No BLE  edema. Pulmonary/Chest: Effort normal and breath sounds normal. No respiratory distress. Abdominal: Soft.  There is no tenderness. Psychiatric: Patient has a normal mood and affect. behavior is normal. Judgment and thought content normal.   PHQ2/9: Depression screen [facility] 2/9 10/27/2018 07/31/2018 04/28/2018 10/25/2017 08/23/2017  Decreased Interest 0 0 0 0 0  Down, Depressed, Hopeless 0 0 0 0 0  PHQ - 2 Score 0 0 0 0 0  Altered sleeping 0 0 - - -  Tired, decreased energy 0 0 - - -  Change in appetite 0 0 - - -  Feeling bad or failure about yourself  0 0 - - -  Trouble concentrating 0 0 - - -  Moving slowly or fidgety/restless 0 0 - - -  Suicidal thoughts 0 0 - - -  PHQ-9 Score 0 0 - - -  Difficult doing work/chores - Not difficult at all - - -    phq 9 is negative  Fall Risk: Fall Risk  10/27/2018 07/31/2018 04/28/2018 04/28/2018 10/25/2017  Falls in the past year? 0 0 0 0 No  Number falls in past yr: 0 0 0 0 -  Injury with Fall? 0 0 0 0 -  Follow up - - - Falls prevention discussed -    Functional Status Survey: Is the patient deaf or have difficulty hearing?: No Does the patient have difficulty seeing, even when wearing glasses/contacts?: No Does the patient have difficulty concentrating, remembering, or making decisions?: No Does the patient have difficulty walking or climbing stairs?: No Does the patient have difficulty dressing or bathing?: No Does the patient have difficulty doing errands alone such as visiting a doctor's office or shopping?: No    Assessment & Plan  1. Cherry angioma   2. Senile purpura ([facility])  Stable  3. Acquired hypothyroidism  - TSH  4. Age-related osteoporosis without current pathological fracture  - DG Bone Density; Future - COMPLETE METABOLIC PANEL WITH GFR  5. Dyslipidemia  - Lipid  panel - pravastatin (PRAVACHOL) 20 MG tablet; TAKE 1 TABLET(20 MG) BY MOUTH DAILY  Dispense: 90 tablet; Refill: 1  6. B12 deficiency  - CBC with  Differential/Platelet - B12  7. Vitamin D deficiency  - VITAMIN D 25 Hydroxy (Vit-D Deficiency, Fractures)  8. Gastro-esophageal reflux disease without esophagitis  - omeprazole (PRILOSEC) 40 MG capsule; Take 1 capsule (40 mg total) by mouth every morning.  Dispense: 90 capsule; Refill: 1  9. Chronic constipation  Doing well   10. Long-term use of high-risk medication  - COMPLETE METABOLIC PANEL WITH GFR - CBC with Differential/Platelet

## 2018-10-30 ENCOUNTER — Other Ambulatory Visit: Payer: Self-pay | Admitting: Family Medicine

## 2018-10-30 DIAGNOSIS — Z1231 Encounter for screening mammogram for malignant neoplasm of breast: Secondary | ICD-10-CM

## 2018-10-30 NOTE — Progress Notes (Signed)
   Subjective: 73 y.o. female presenting today with a chief complaint of constant aching pain of the arch of the right foot that began 3-4 months ago. She states the pain started after walking 1.5 miles. She states the pain is improving in the past three weeks. She denies any pain at this time. She has been resting the foot and wearing different shoes. Patient is here for further evaluation and treatment.   Past Medical History:  Diagnosis Date  . Allergy   . Back pain   . Dyslipidemia   . Fibrocystic breast disease   . GERD (gastroesophageal reflux disease)   . Hyperlipidemia   . Menopause   . Osteoporosis   . Reflux esophagitis   . RLS (restless legs syndrome)   . Urinary dysfunction      Objective: Physical Exam General: The patient is alert and oriented x3 in no acute distress.  Dermatology: Skin is warm, dry and supple bilateral lower extremities. Negative for open lesions or macerations bilateral.   Vascular: Dorsalis Pedis and Posterior Tibial pulses palpable bilateral.  Capillary fill time is immediate to all digits.  Neurological: Epicritic and protective threshold intact bilateral.   Musculoskeletal: Tenderness to palpation to the plantar aspect of the right heel along the plantar fascia. All other joints range of motion within normal limits bilateral. Strength 5/5 in all groups bilateral.   Radiographic exam: Normal osseous mineralization. Joint spaces preserved. No fracture/dislocation/boney destruction. No other soft tissue abnormalities or radiopaque foreign bodies.   Assessment: 1. Plantar fasciitis right - midsubstance   Plan of Care:  1. Patient evaluated. Xrays reviewed.   2. Declined injections.  3. Appointment with Liliane Channel, Pedorthist, for custom molded orthotics.  4. Prescription for Meloxicam provided to patient. 5. Return to clinic as needed.     Edrick Kins, DPM [facility]  Dr. Edrick Kins, DPM    [address], [place] [address]                Office [phone]  Fax [phone]

## 2018-10-31 LAB — LIPID PANEL
Cholesterol: 235 mg/dL — ABNORMAL HIGH (ref <200)
HDL: 79 mg/dL (ref >=50)
LDL Cholesterol (Calc): 137 mg/dL (calc) — ABNORMAL HIGH
Non-HDL Cholesterol (Calc): 156 mg/dL (calc) — ABNORMAL HIGH (ref <130)
Total CHOL/HDL Ratio: 3 (calc) (ref <5.0)
Triglycerides: 93 mg/dL (ref <150)

## 2018-10-31 LAB — CBC WITH DIFFERENTIAL/PLATELET
Absolute Monocytes: 369 cells/uL (ref 200–950)
Basophils Absolute: 18 cells/uL (ref 0–200)
Basophils Relative: 0.4 %
Eosinophils Absolute: 180 cells/uL (ref 15–500)
Eosinophils Relative: 4 %
HCT: 33.4 % — ABNORMAL LOW (ref 35.0–45.0)
Hemoglobin: 11 g/dL — ABNORMAL LOW (ref 11.7–15.5)
Lymphs Abs: 1260 cells/uL (ref 850–3900)
MCH: 31.6 pg (ref 27.0–33.0)
MCHC: 32.9 g/dL (ref 32.0–36.0)
MCV: 96 fL (ref 80.0–100.0)
MPV: 10.7 fL (ref 7.5–12.5)
Monocytes Relative: 8.2 %
Neutro Abs: 2673 cells/uL (ref 1500–7800)
Neutrophils Relative %: 59.4 %
Platelets: 279 10*3/uL (ref 140–400)
RBC: 3.48 10*6/uL — ABNORMAL LOW (ref 3.80–5.10)
RDW: 12.2 % (ref 11.0–15.0)
Total Lymphocyte: 28 %
WBC: 4.5 10*3/uL (ref 3.8–10.8)

## 2018-10-31 LAB — TEST AUTHORIZATION

## 2018-10-31 LAB — COMPLETE METABOLIC PANEL WITH GFR
AG Ratio: 1.7 (calc) (ref 1.0–2.5)
ALT: 13 U/L (ref 6–29)
AST: 21 U/L (ref 10–35)
Albumin: 4.3 g/dL (ref 3.6–5.1)
Alkaline phosphatase (APISO): 113 U/L (ref 37–153)
BUN: 15 mg/dL (ref 7–25)
CO2: 31 mmol/L (ref 20–32)
Calcium: 10.2 mg/dL (ref 8.6–10.4)
Chloride: 100 mmol/L (ref 98–110)
Creat: 0.74 mg/dL (ref 0.60–0.93)
GFR, Est African American: 92 mL/min/{1.73_m2} (ref >=60)
GFR, Est Non African American: 79 mL/min/{1.73_m2} (ref >=60)
Globulin: 2.5 g/dL (calc) (ref 1.9–3.7)
Glucose, Bld: 101 mg/dL — ABNORMAL HIGH (ref 65–99)
Potassium: 4.8 mmol/L (ref 3.5–5.3)
Sodium: 137 mmol/L (ref 135–146)
Total Bilirubin: 0.3 mg/dL (ref 0.2–1.2)
Total Protein: 6.8 g/dL (ref 6.1–8.1)

## 2018-10-31 LAB — IRON,TIBC AND FERRITIN PANEL
%SAT: 25 % (calc) (ref 16–45)
Ferritin: 73 ng/mL (ref 16–288)
Iron: 85 ug/dL (ref 45–160)
TIBC: 334 mcg/dL (calc) (ref 250–450)

## 2018-10-31 LAB — VITAMIN D 25 HYDROXY (VIT D DEFICIENCY, FRACTURES): Vit D, 25-Hydroxy: 52 ng/mL (ref 30–100)

## 2018-10-31 LAB — VITAMIN B12: Vitamin B-12: 996 pg/mL (ref 200–1100)

## 2018-10-31 LAB — TSH: TSH: 1.72 mIU/L (ref 0.40–4.50)

## 2018-11-14 DIAGNOSIS — H40003 Preglaucoma, unspecified, bilateral: Secondary | ICD-10-CM | POA: Diagnosis not present

## 2018-11-15 ENCOUNTER — Ambulatory Visit (INDEPENDENT_AMBULATORY_CARE_PROVIDER_SITE_OTHER): Payer: Medicare Other | Admitting: Orthotics

## 2018-11-15 ENCOUNTER — Other Ambulatory Visit: Payer: Self-pay

## 2018-11-15 DIAGNOSIS — M722 Plantar fascial fibromatosis: Secondary | ICD-10-CM

## 2018-11-15 NOTE — Progress Notes (Signed)
Patient decided to just refurbish her old foot orthoics with removal of met pad.

## 2018-11-21 DIAGNOSIS — H40003 Preglaucoma, unspecified, bilateral: Secondary | ICD-10-CM | POA: Diagnosis not present

## 2018-11-23 ENCOUNTER — Other Ambulatory Visit: Payer: Self-pay | Admitting: Podiatry

## 2018-11-23 NOTE — Telephone Encounter (Signed)
Rx Meloxicam 15mg  90tab sent to [facility]

## 2018-11-30 ENCOUNTER — Ambulatory Visit
Admission: RE | Admit: 2018-11-30 | Discharge: 2018-11-30 | Disposition: A | Discharge status: Home / Self Care | Payer: Medicare Other | Source: Ambulatory Visit | Attending: Family Medicine | Admitting: Family Medicine

## 2018-11-30 DIAGNOSIS — Z780 Asymptomatic menopausal state: Secondary | ICD-10-CM | POA: Diagnosis not present

## 2018-11-30 DIAGNOSIS — M810 Age-related osteoporosis without current pathological fracture: Secondary | ICD-10-CM | POA: Diagnosis not present

## 2018-12-04 DIAGNOSIS — M810 Age-related osteoporosis without current pathological fracture: Secondary | ICD-10-CM | POA: Diagnosis not present

## 2018-12-19 ENCOUNTER — Ambulatory Visit (INDEPENDENT_AMBULATORY_CARE_PROVIDER_SITE_OTHER): Payer: Medicare Other

## 2018-12-19 ENCOUNTER — Other Ambulatory Visit: Payer: Self-pay

## 2018-12-19 DIAGNOSIS — Z23 Encounter for immunization: Secondary | ICD-10-CM | POA: Diagnosis not present

## 2019-01-01 ENCOUNTER — Other Ambulatory Visit: Payer: Self-pay | Admitting: Family Medicine

## 2019-01-01 DIAGNOSIS — E039 Hypothyroidism, unspecified: Secondary | ICD-10-CM

## 2019-01-08 ENCOUNTER — Ambulatory Visit
Admission: RE | Admit: 2019-01-08 | Discharge: 2019-01-08 | Disposition: A | Discharge status: Home / Self Care | Payer: Medicare Other | Source: Ambulatory Visit | Attending: Family Medicine | Admitting: Family Medicine

## 2019-01-08 DIAGNOSIS — Z1231 Encounter for screening mammogram for malignant neoplasm of breast: Secondary | ICD-10-CM | POA: Diagnosis not present

## 2019-01-15 DIAGNOSIS — M810 Age-related osteoporosis without current pathological fracture: Secondary | ICD-10-CM | POA: Diagnosis not present

## 2019-02-24 ENCOUNTER — Emergency Department
Admission: EM | Admit: 2019-02-24 | Discharge: 2019-02-24 | Disposition: A | Discharge status: Home / Self Care | Payer: Medicare Other | Attending: Emergency Medicine | Admitting: Emergency Medicine

## 2019-02-24 ENCOUNTER — Encounter: Payer: Self-pay | Admitting: Emergency Medicine

## 2019-02-24 ENCOUNTER — Other Ambulatory Visit: Payer: Self-pay

## 2019-02-24 ENCOUNTER — Emergency Department: Payer: Medicare Other

## 2019-02-24 DIAGNOSIS — Y92009 Unspecified place in unspecified non-institutional (private) residence as the place of occurrence of the external cause: Secondary | ICD-10-CM | POA: Insufficient documentation

## 2019-02-24 DIAGNOSIS — X509XXA Other and unspecified overexertion or strenuous movements or postures, initial encounter: Secondary | ICD-10-CM | POA: Diagnosis not present

## 2019-02-24 DIAGNOSIS — Z79899 Other long term (current) drug therapy: Secondary | ICD-10-CM | POA: Insufficient documentation

## 2019-02-24 DIAGNOSIS — Y999 Unspecified external cause status: Secondary | ICD-10-CM | POA: Insufficient documentation

## 2019-02-24 DIAGNOSIS — S93602A Unspecified sprain of left foot, initial encounter: Secondary | ICD-10-CM | POA: Diagnosis not present

## 2019-02-24 DIAGNOSIS — E039 Hypothyroidism, unspecified: Secondary | ICD-10-CM | POA: Insufficient documentation

## 2019-02-24 DIAGNOSIS — S99922A Unspecified injury of left foot, initial encounter: Secondary | ICD-10-CM | POA: Diagnosis present

## 2019-02-24 DIAGNOSIS — Y9301 Activity, walking, marching and hiking: Secondary | ICD-10-CM | POA: Diagnosis not present

## 2019-02-24 NOTE — ED Triage Notes (Signed)
Pt via pov from home with c/o left foot pain after mechanical fall at home. Pt states the pain is on the top of her foot and that her toes are numb. Cap refill <2 seconds. Pt alert & oriented; nad noted.

## 2019-02-24 NOTE — ED Notes (Addendum)
Pt states she twisted her left foot outward. Swelling noted on left ankle. Left foot and ankle elevated, ice pack applied. Capillary refill good, no broken skin or bruising. Pt denies hitting head, denies LOC, denies injuring any other part of body.

## 2019-02-24 NOTE — Discharge Instructions (Addendum)
Your exam and XR do not reveal any fractures or dislocation to the left foot. You have a sprain to the foot. Wear the post-op shoe as needed for pain relief. Follow-up with your provider or Dr. Cleda Mccreedy for ongoing symptoms. Take OTC Tylenol or Motrin as needed.

## 2019-02-24 NOTE — ED Provider Notes (Signed)
[facility] Emergency Department Provider Note ____________________________________________  Time seen: 1327  I have reviewed the triage vital signs and the nursing notes.  HISTORY  Chief Complaint  Fall  HPI Heather Singh is a 73 y.o. female presents with self to the ED for evaluation of left foot pain following a mechanical injury.  Patient describes she was at home walking with her shoes on, when she stepped on something, that caused her to roll her left ankle.  She describes immediate pain to the dorsal aspect of the left foot.  Since that time she had increased pain with shifting weight forward on her toes with normal gait.  She denies any outright fall related to the injury.  She denies any other injury at this time.   Past Medical History:  Diagnosis Date  . Allergy   . Back pain   . Dyslipidemia   . Fibrocystic breast disease   . GERD (gastroesophageal reflux disease)   . Hyperlipidemia   . Menopause   . Osteoporosis   . Reflux esophagitis   . RLS (restless legs syndrome)   . Urinary dysfunction     Patient Active Problem List   Diagnosis Date Noted  . Hx of adenomatous colonic polyps 05/19/2018  . Senile purpura ([place]) 04/28/2018  . Closed compression fracture of L3 lumbar vertebra, sequela 08/23/2016  . Atrophic vaginitis 06/05/2015  . Acquired hypothyroidism 10/19/2014  . Perennial allergic rhinitis 10/19/2014  . B12 deficiency 10/19/2014  . Fibrocystic breast disease 10/19/2014  . Gastro-esophageal reflux disease without esophagitis 10/19/2014  . OP (osteoporosis) 10/19/2014  . Ovarian failure 10/19/2014  . Hiatal hernia 10/19/2014  . Restless legs syndrome 10/19/2014  . ABNORMAL EKG 09/03/2009  . Dyslipidemia 08/23/2008  . Hyperlipidemia 08/23/2008  . Vitamin D deficiency 10/10/2007    Past Surgical History:  Procedure Laterality Date  . BREAST CYST ASPIRATION Left   . BREAST EXCISIONAL BIOPSY Left 1980's   neg  . CATARACT  EXTRACTION    . EYE SURGERY     left  . KYPHOPLASTY N/A 08/19/2016   Procedure: KYPHOPLASTY L3,L4;  Surgeon: Hessie Knows, MD;  Location: [facility] ORS;  Service: Orthopedics;  Laterality: N/A;  . KYPHOPLASTY N/A 09/21/2016   Procedure: [id number];  Surgeon: Hessie Knows, MD;  Location: [facility] ORS;  Service: Orthopedics;  Laterality: N/A;  . MOUTH SURGERY    . POLYPECTOMY    . SLING BLADE PROCEDURE    . TOTAL ABDOMINAL HYSTERECTOMY      Prior to Admission medications   Medication Sig Start Date End Date Taking? Authorizing Provider  calcium carbonate (TUMS EX) 750 MG chewable tablet Chew 1 tablet by mouth daily.    [provider]  Cholecalciferol (VITAMIN D) 2000 units CAPS Take 1 capsule by mouth daily.    [provider]  Insulin Pen Needle (COMFORT EZ PEN NEEDLES) 33G X 8 MM MISC Use 1 each once daily. 11/24/16   [provider]  levothyroxine (SYNTHROID) 25 MCG tablet TAKE 1 TABLET BY MOUTH EVERY DAY ALTERNATING WITH 2 TABLETS EVERY OTHER DAY 01/01/19   Ancil Boozer, Drue Stager, MD  loratadine (CLARITIN) 10 MG tablet Take 1 tablet by mouth daily.    [provider]  Magnesium 400 MG TABS Take 1 tablet by mouth daily.    [provider]  meloxicam (MOBIC) 15 MG tablet TAKE 1 TABLET(15 MG) BY MOUTH DAILY 11/23/18   Edrick Kins, DPM  omeprazole (PRILOSEC) 40 MG capsule Take 1 capsule (40 mg total)  by mouth every morning. 10/27/18   Steele Sizer, MD  Polyethylene Glycol 400 (BLINK TEARS) 0.25 % SOLN Place 1 drop into both eyes 2 (two) times daily as needed (dry eyes).    [provider]  pravastatin (PRAVACHOL) 20 MG tablet TAKE 1 TABLET(20 MG) BY MOUTH DAILY 10/27/18   Steele Sizer, MD  Teriparatide, Recombinant, 620 MCG/2.48ML SOPN Inject into the skin. 08/16/18   [provider]  triamcinolone cream (KENALOG) 0.1 %  08/16/18   [provider]  vitamin B-12 (CYANOCOBALAMIN) 1000 MCG tablet Take 1,000 mcg by mouth 3 (three)  times a week.    [provider]    Allergies Alendronate, Levofloxacin, Nitrofurantoin monohyd macro, Sulfamethoxazole-trimethoprim, Etodolac, and Penicillins  Family History  Problem Relation Age of Onset  . Transient ischemic attack Mother   . Atrial fibrillation Mother   . Arthritis Mother        OA  . Heart disease Father        CHF  . CAD Father   . Hyperlipidemia Sister   . Arthritis Sister        OA  . Drug abuse Son   . Hypertension Son   . Stroke Sister   . Arthritis Sister        OA  . Heart failure Other   . Coronary artery disease Other   . Stroke Other   . Hyperlipidemia Other   . Hypertension Other   . Thyroid disease Other   . Breast cancer Maternal Aunt   . Breast cancer Paternal Aunt     Social History Social History   Tobacco Use  . Smoking status: Never Smoker  . Smokeless tobacco: Never Used  Substance Use Topics  . Alcohol use: No    Alcohol/week: 0.0 standard drinks  . Drug use: No    Review of Systems  Constitutional: Negative for fever. Cardiovascular: Negative for chest pain. Respiratory: Negative for shortness of breath. Musculoskeletal: Negative for back pain.  Left foot pain as above. Skin: Negative for rash. Neurological: Negative for headaches, focal weakness or numbness. ____________________________________________  PHYSICAL EXAM:  VITAL SIGNS: ED Triage Vitals  Enc Vitals Group     BP 02/24/19 1247 126/74     Pulse Rate 02/24/19 1247 68     Resp 02/24/19 1247 18     Temp 02/24/19 1247 98.7 F (37.1 C)     Temp Source 02/24/19 1247 Oral     SpO2 02/24/19 1247 100 %     Weight 02/24/19 1248 104 lb (47.2 kg)     Height 02/24/19 1248 5\' 2"  (1.575 m)     Head Circumference --      Peak Flow --      Pain Score 02/24/19 1247 8     Pain Loc --      Pain Edu? --      Excl. in [place]? --     Constitutional: Alert and oriented. Well appearing and in no distress. Head: Normocephalic and atraumatic. Eyes:  Conjunctivae are normal. Normal extraocular movements Cardiovascular: Normal rate, regular rhythm. Normal distal pulses and cap refill. Respiratory: Normal respiratory effort. No wheezes/rales/rhonchi. Musculoskeletal: Left foot without obvious deformity, dislocation, edema or ecchymosis.  Patient is tender to palpation over the dorsal metatarsals.  Ankle exam is benign without any dysfunction, laxity, or deformity.  No calf or Achilles tenderness is appreciated.  Patient is mildly tender to palpation over the MTPs over the plantar surface.  Nontender with normal range of motion  in all extremities.  Neurologic: Cranial nerves II through XII grossly intact.  Normal speech and language. No gross focal neurologic deficits are appreciated. Skin:  Skin is warm, dry and intact. No rash noted. ____________________________________________   RADIOLOGY  DG Left Foot IMPRESSION: Negative for acute bony abnormality.  Osteopenia ____________________________________________  PROCEDURES  Post-op shoe Procedures ____________________________________________  INITIAL IMPRESSION / ASSESSMENT AND PLAN / ED COURSE  DDX: foot sprain, foot fracture, ankle sprain  Patient with ED evaluation of dorsal left foot pain following mechanical injury.  Patient is reassured by her negative x-ray at this time.  Exam reveals symptoms consistent with a foot sprain.  Should be placed in a postop shoe for comfort.  She may take over-the-counter Tylenol or Motrin as needed.  Return precautions have been reviewed.  Patient will otherwise follow-up with podiatry or her primary provider for ongoing symptoms.  Heather Singh was evaluated in Emergency Department on 02/24/2019 for the symptoms described in the history of present illness. She was evaluated in the context of the global COVID-19 pandemic, which necessitated consideration that the patient might be at risk for infection with the SARS-CoV-2 virus that causes COVID-19.  Institutional protocols and algorithms that pertain to the evaluation of patients at risk for COVID-19 are in a state of rapid change based on information released by regulatory bodies including the CDC and federal and state organizations. These policies and algorithms were followed during the patient's care in the ED. ____________________________________________  FINAL CLINICAL IMPRESSION(S) / ED DIAGNOSES  Final diagnoses:  Foot sprain, left, initial encounter      Melvenia Needles, PA-C 02/24/19 1442    Delman Kitten, MD 02/24/19 [phone]

## 2019-03-22 ENCOUNTER — Encounter: Payer: Self-pay | Admitting: Family Medicine

## 2019-03-30 ENCOUNTER — Other Ambulatory Visit: Payer: Self-pay | Admitting: Family Medicine

## 2019-03-30 DIAGNOSIS — E039 Hypothyroidism, unspecified: Secondary | ICD-10-CM

## 2019-04-05 DIAGNOSIS — S92325A Nondisplaced fracture of second metatarsal bone, left foot, initial encounter for closed fracture: Secondary | ICD-10-CM | POA: Diagnosis not present

## 2019-04-05 DIAGNOSIS — S92342D Displaced fracture of fourth metatarsal bone, left foot, subsequent encounter for fracture with routine healing: Secondary | ICD-10-CM | POA: Diagnosis not present

## 2019-04-05 DIAGNOSIS — S92325D Nondisplaced fracture of second metatarsal bone, left foot, subsequent encounter for fracture with routine healing: Secondary | ICD-10-CM | POA: Diagnosis not present

## 2019-04-05 DIAGNOSIS — S92332D Displaced fracture of third metatarsal bone, left foot, subsequent encounter for fracture with routine healing: Secondary | ICD-10-CM | POA: Diagnosis not present

## 2019-04-05 DIAGNOSIS — S92332A Displaced fracture of third metatarsal bone, left foot, initial encounter for closed fracture: Secondary | ICD-10-CM | POA: Diagnosis not present

## 2019-04-05 DIAGNOSIS — S92342A Displaced fracture of fourth metatarsal bone, left foot, initial encounter for closed fracture: Secondary | ICD-10-CM | POA: Diagnosis not present

## 2019-04-23 ENCOUNTER — Other Ambulatory Visit: Payer: Self-pay | Admitting: Family Medicine

## 2019-04-23 DIAGNOSIS — E785 Hyperlipidemia, unspecified: Secondary | ICD-10-CM

## 2019-04-23 DIAGNOSIS — K219 Gastro-esophageal reflux disease without esophagitis: Secondary | ICD-10-CM

## 2019-05-01 ENCOUNTER — Ambulatory Visit: Payer: Medicare Other | Admitting: Family Medicine

## 2019-05-01 DIAGNOSIS — L820 Inflamed seborrheic keratosis: Secondary | ICD-10-CM | POA: Diagnosis not present

## 2019-05-01 DIAGNOSIS — Z1283 Encounter for screening for malignant neoplasm of skin: Secondary | ICD-10-CM | POA: Diagnosis not present

## 2019-05-01 DIAGNOSIS — Z85828 Personal history of other malignant neoplasm of skin: Secondary | ICD-10-CM | POA: Diagnosis not present

## 2019-05-01 DIAGNOSIS — Z86018 Personal history of other benign neoplasm: Secondary | ICD-10-CM | POA: Diagnosis not present

## 2019-05-01 DIAGNOSIS — Z872 Personal history of diseases of the skin and subcutaneous tissue: Secondary | ICD-10-CM | POA: Diagnosis not present

## 2019-05-01 DIAGNOSIS — L821 Other seborrheic keratosis: Secondary | ICD-10-CM | POA: Diagnosis not present

## 2019-05-01 DIAGNOSIS — D1801 Hemangioma of skin and subcutaneous tissue: Secondary | ICD-10-CM | POA: Diagnosis not present

## 2019-05-01 DIAGNOSIS — L578 Other skin changes due to chronic exposure to nonionizing radiation: Secondary | ICD-10-CM | POA: Diagnosis not present

## 2019-05-01 DIAGNOSIS — L814 Other melanin hyperpigmentation: Secondary | ICD-10-CM | POA: Diagnosis not present

## 2019-05-02 DIAGNOSIS — S92342D Displaced fracture of fourth metatarsal bone, left foot, subsequent encounter for fracture with routine healing: Secondary | ICD-10-CM | POA: Diagnosis not present

## 2019-05-02 DIAGNOSIS — S92325D Nondisplaced fracture of second metatarsal bone, left foot, subsequent encounter for fracture with routine healing: Secondary | ICD-10-CM | POA: Diagnosis not present

## 2019-05-02 DIAGNOSIS — S92332D Displaced fracture of third metatarsal bone, left foot, subsequent encounter for fracture with routine healing: Secondary | ICD-10-CM | POA: Diagnosis not present

## 2019-05-04 ENCOUNTER — Encounter: Payer: Self-pay | Admitting: Family Medicine

## 2019-05-04 ENCOUNTER — Other Ambulatory Visit: Payer: Self-pay

## 2019-05-04 ENCOUNTER — Ambulatory Visit (INDEPENDENT_AMBULATORY_CARE_PROVIDER_SITE_OTHER): Payer: Medicare Other | Admitting: Family Medicine

## 2019-05-04 VITALS — BP 130/74 | HR 79 | Temp 96.9°F | Resp 16 | Ht 62.0 in | Wt 105.7 lb

## 2019-05-04 DIAGNOSIS — E039 Hypothyroidism, unspecified: Secondary | ICD-10-CM

## 2019-05-04 DIAGNOSIS — D649 Anemia, unspecified: Secondary | ICD-10-CM | POA: Diagnosis not present

## 2019-05-04 DIAGNOSIS — D692 Other nonthrombocytopenic purpura: Secondary | ICD-10-CM | POA: Diagnosis not present

## 2019-05-04 DIAGNOSIS — E538 Deficiency of other specified B group vitamins: Secondary | ICD-10-CM | POA: Diagnosis not present

## 2019-05-04 DIAGNOSIS — E785 Hyperlipidemia, unspecified: Secondary | ICD-10-CM

## 2019-05-04 DIAGNOSIS — K219 Gastro-esophageal reflux disease without esophagitis: Secondary | ICD-10-CM | POA: Diagnosis not present

## 2019-05-04 DIAGNOSIS — E559 Vitamin D deficiency, unspecified: Secondary | ICD-10-CM

## 2019-05-04 DIAGNOSIS — M810 Age-related osteoporosis without current pathological fracture: Secondary | ICD-10-CM | POA: Diagnosis not present

## 2019-05-04 DIAGNOSIS — K5909 Other constipation: Secondary | ICD-10-CM | POA: Diagnosis not present

## 2019-05-04 LAB — CBC WITH DIFFERENTIAL/PLATELET
Absolute Monocytes: 464 cells/uL (ref 200–950)
Basophils Absolute: 22 cells/uL (ref 0–200)
Basophils Relative: 0.4 %
Eosinophils Absolute: 81 cells/uL (ref 15–500)
Eosinophils Relative: 1.5 %
HCT: 34.7 % — ABNORMAL LOW (ref 35.0–45.0)
Hemoglobin: 11.6 g/dL — ABNORMAL LOW (ref 11.7–15.5)
Lymphs Abs: 1647 cells/uL (ref 850–3900)
MCH: 32 pg (ref 27.0–33.0)
MCHC: 33.4 g/dL (ref 32.0–36.0)
MCV: 95.9 fL (ref 80.0–100.0)
MPV: 10.6 fL (ref 7.5–12.5)
Monocytes Relative: 8.6 %
Neutro Abs: 3186 cells/uL (ref 1500–7800)
Neutrophils Relative %: 59 %
Platelets: 238 10*3/uL (ref 140–400)
RBC: 3.62 10*6/uL — ABNORMAL LOW (ref 3.80–5.10)
RDW: 12.2 % (ref 11.0–15.0)
Total Lymphocyte: 30.5 %
WBC: 5.4 10*3/uL (ref 3.8–10.8)

## 2019-05-04 MED ORDER — LEVOTHYROXINE SODIUM 25 MCG PO TABS: 25.0000 ug | ORAL_TABLET | Freq: Every day | ORAL | 1 refills | Status: DC

## 2019-05-04 MED ORDER — OMEPRAZOLE 40 MG PO CPDR: 40.0000 mg | DELAYED_RELEASE_CAPSULE | Freq: Every day | ORAL | 1 refills | Status: DC

## 2019-05-04 MED ORDER — PRAVASTATIN SODIUM 20 MG PO TABS: 20.0000 mg | ORAL_TABLET | Freq: Every day | ORAL | 1 refills | Status: DC

## 2019-05-04 NOTE — Progress Notes (Signed)
Name: Heather Singh   MRN: [id number]    DOB: [birth year]   Date:05/04/2019       Progress Note  Subjective  Chief Complaint  Chief Complaint  Patient presents with  . Medication Refill  . Hypothyroidism  . Hyperlipidemia  . Osteoporosis  . Gastroesophageal Reflux  . Constipation    HPI  Osteoporosis: she has been seeing Dr. Tereasa Coop started 10/2016. She is tolerating medication well, she had a bone density done by Dr. Gabriel Carina had her first Prolia Fall 2020, she stepped on a brass claw on Dec 12 th and broke three metatarsal bones and had to go to [facility] seen by Dr. Cleda Mccreedy, she was placed on a Ortho shoe and on her last visit with him this week she was released to wear regular shoes. Pain now is 2/10 and only when bearing weight, only on her toes  GERD and hiatal hernia : Has been seeing Dr. Tami Ribas and he advised  Omeprazole 40mg . Says symptoms have been well controlled at this dose. Still has a dry cough,no heartburn, she has regurgitation rarely after meals. She states symptoms better controlled when she eats smaller portion and slowly symptoms are better controlled   Hypothyroidism:she is taking levothyroxine as recommended. Last TSH was 10/2018  and at goal. She states skin is not as dry as it was last Fall. She using a better moisturizer suggested by Dermatologist - Dr. Velora Heckler Angioma: itching is better, under the care of Dr. Nicole Kindred   Hyperlipidemia:TakingPravachol. Noside effectsfrom medications, no joint/muscle aches. Continue medication  Chronic constipation:doing well on Miralax , almost daily, Bristol scale 4 when she takes medication, she has been drinking 6-7 glasses of water daily Unchanged  Jaw problems: had some problem with her jaw bone, had teeth extraction to get an implant but had problems with the implant, and is worried about getting a bridge   Patient Active Problem List   Diagnosis Date Noted  . Hx of adenomatous colonic polyps  05/19/2018  . Senile purpura ([place]) 04/28/2018  . Closed compression fracture of L3 lumbar vertebra, sequela 08/23/2016  . Atrophic vaginitis 06/05/2015  . Acquired hypothyroidism 10/19/2014  . Perennial allergic rhinitis 10/19/2014  . B12 deficiency 10/19/2014  . Fibrocystic breast disease 10/19/2014  . Gastro-esophageal reflux disease without esophagitis 10/19/2014  . OP (osteoporosis) 10/19/2014  . Ovarian failure 10/19/2014  . Hiatal hernia 10/19/2014  . Restless legs syndrome 10/19/2014  . ABNORMAL EKG 09/03/2009  . Dyslipidemia 08/23/2008  . Hyperlipidemia 08/23/2008  . Vitamin D deficiency 10/10/2007    Past Surgical History:  Procedure Laterality Date  . BREAST CYST ASPIRATION Left   . BREAST EXCISIONAL BIOPSY Left 1980's   neg  . CATARACT EXTRACTION    . EYE SURGERY     left  . KYPHOPLASTY N/A 08/19/2016   Procedure: KYPHOPLASTY L3,L4;  Surgeon: Hessie Knows, MD;  Location: [facility] ORS;  Service: Orthopedics;  Laterality: N/A;  . KYPHOPLASTY N/A 09/21/2016   Procedure: [id number];  Surgeon: Hessie Knows, MD;  Location: [facility] ORS;  Service: Orthopedics;  Laterality: N/A;  . MOUTH SURGERY    . POLYPECTOMY    . SLING BLADE PROCEDURE    . TOTAL ABDOMINAL HYSTERECTOMY      Family History  Problem Relation Age of Onset  . Transient ischemic attack Mother   . Atrial fibrillation Mother   . Arthritis Mother        OA  . Heart disease Father  CHF  . CAD Father   . Hyperlipidemia Sister   . Arthritis Sister        OA  . Drug abuse Son   . Hypertension Son   . Stroke Sister   . Arthritis Sister        OA  . Heart failure Other   . Coronary artery disease Other   . Stroke Other   . Hyperlipidemia Other   . Hypertension Other   . Thyroid disease Other   . Breast cancer Maternal Aunt   . Breast cancer Paternal Aunt     Social History   Tobacco Use  . Smoking status: Never Smoker  . Smokeless tobacco: Never Used  Substance Use Topics  . Alcohol use:  No    Alcohol/week: 0.0 standard drinks  . Drug use: No     Current Outpatient Medications:  .  calcium carbonate (TUMS EX) 750 MG chewable tablet, Chew 1 tablet by mouth daily., Disp: , Rfl:  .  Cholecalciferol (VITAMIN D) 2000 units CAPS, Take 1 capsule by mouth daily., Disp: , Rfl:  .  denosumab (PROLIA) 60 MG/ML SOSY injection, Inject 60 mg into the skin every 6 (six) months., Disp: , Rfl:  .  levothyroxine (SYNTHROID) 25 MCG tablet, Take 1 tablet (25 mcg total) by mouth daily before breakfast., Disp: 135 tablet, Rfl: 1 .  loratadine (CLARITIN) 10 MG tablet, Take 1 tablet by mouth daily., Disp: , Rfl:  .  Magnesium 400 MG TABS, Take 1 tablet by mouth daily., Disp: , Rfl:  .  omeprazole (PRILOSEC) 40 MG capsule, Take 1 capsule (40 mg total) by mouth daily., Disp: 90 capsule, Rfl: 1 .  Polyethylene Glycol 400 (BLINK TEARS) 0.25 % SOLN, Place 1 drop into both eyes 2 (two) times daily as needed (dry eyes)., Disp: , Rfl:  .  pravastatin (PRAVACHOL) 20 MG tablet, Take 1 tablet (20 mg total) by mouth daily., Disp: 90 tablet, Rfl: 1 .  triamcinolone cream (KENALOG) 0.1 %, , Disp: , Rfl:  .  vitamin B-12 (CYANOCOBALAMIN) 1000 MCG tablet, Take 1,000 mcg by mouth 3 (three) times a week., Disp: , Rfl:  .  Insulin Pen Needle (COMFORT EZ PEN NEEDLES) 33G X 8 MM MISC, Use 1 each once daily., Disp: , Rfl:   Allergies  Allergen Reactions  . Alendronate     Dyspepsia, inflamed esophagus   . Levofloxacin Hives  . Nitrofurantoin Monohyd Macro Hives  . Sulfamethoxazole-Trimethoprim Hives  . Etodolac Hives  . Penicillins Hives and Rash    Has patient had a PCN reaction causing immediate rash, facial/tongue/throat swelling, SOB or lightheadedness with hypotension: No Has patient had a PCN reaction causing severe rash involving mucus membranes or skin necrosis: No Has patient had a PCN reaction that required hospitalization: No Has patient had a PCN reaction occurring within the last 10 years: No If all  of the above answers are "NO", then may proceed with Cephalosporin use.     I personally reviewed active problem list, medication list, allergies, family history, social history, health maintenance with the patient/caregiver today.   ROS  Constitutional: Negative for fever or weight change.  Respiratory: Negative for cough and shortness of breath.   Cardiovascular: Negative for chest pain or palpitations.  Gastrointestinal: Negative for abdominal pain, no bowel changes.  Musculoskeletal: Negative for gait problem or joint swelling.  Skin: Negative for rash.  Neurological: Negative for dizziness or headache.  No other specific complaints in a complete review of systems (  except as listed in HPI above).  Objective  Vitals:   05/04/19 1000  BP: 130/74  Pulse: 79  Resp: 16  Temp: (!) 96.9 F (36.1 C)  TempSrc: Temporal  SpO2: 98%  Weight: 105 lb 11.2 oz (47.9 kg)  Height: 5\' 2"  (1.575 m)    Body mass index is 19.33 kg/m.  Physical Exam  Constitutional: Patient appears well-developed and petite distress.  HEENT: head atraumatic, normocephalic, pupils equal and reactive to light Cardiovascular: Normal rate, regular rhythm and normal heart sounds.  No murmur heard. No BLE edema. Pulmonary/Chest: Effort normal and breath sounds normal. No respiratory distress. Abdominal: Soft.  There is no tenderness. Psychiatric: Patient has a normal mood and affect. behavior is normal. Judgment and thought content normal.  PHQ2/9: Depression screen [facility] 2/9 05/04/2019 10/27/2018 07/31/2018 04/28/2018 10/25/2017  Decreased Interest 0 0 0 0 0  Down, Depressed, Hopeless 0 0 0 0 0  PHQ - 2 Score 0 0 0 0 0  Altered sleeping 0 0 0 - -  Tired, decreased energy 0 0 0 - -  Change in appetite 0 0 0 - -  Feeling bad or failure about yourself  0 0 0 - -  Trouble concentrating 0 0 0 - -  Moving slowly or fidgety/restless 0 0 0 - -  Suicidal thoughts 0 0 0 - -  PHQ-9 Score 0 0 0 - -  Difficult doing  work/chores Not difficult at all - Not difficult at all - -    phq 9 is negative   Fall Risk: Fall Risk  05/04/2019 10/27/2018 07/31/2018 04/28/2018 04/28/2018  Falls in the past year? 0 0 0 0 0  Number falls in past yr: 0 0 0 0 0  Injury with Fall? 0 0 0 0 0  Follow up - - - - Falls prevention discussed     Functional Status Survey: Is the patient deaf or have difficulty hearing?: No Does the patient have difficulty seeing, even when wearing glasses/contacts?: Yes Does the patient have difficulty concentrating, remembering, or making decisions?: No Does the patient have difficulty walking or climbing stairs?: No Does the patient have difficulty dressing or bathing?: No Does the patient have difficulty doing errands alone such as visiting a doctor's office or shopping?: No    Assessment & Plan  1. Anemia, unspecified type  - CBC with Differential/Platelet  2. Age-related osteoporosis without current pathological fracture  On problia now   3. Acquired hypothyroidism  - levothyroxine (SYNTHROID) 25 MCG tablet; Take 1 tablet (25 mcg total) by mouth daily before breakfast.  Dispense: 135 tablet; Refill: 1  4. Senile purpura ([facility])  stable  5. Vitamin D deficiency  Continue supplementation   6. Dyslipidemia  She wants to continue same dose - pravastatin (PRAVACHOL) 20 MG tablet; Take 1 tablet (20 mg total) by mouth daily.  Dispense: 90 tablet; Refill: 1  7. B12 deficiency   8. Chronic constipation   9. Gastro-esophageal reflux disease without esophagitis  - omeprazole (PRILOSEC) 40 MG capsule; Take 1 capsule (40 mg total) by mouth daily.  Dispense: 90 capsule; Refill: 1

## 2019-05-11 ENCOUNTER — Ambulatory Visit (INDEPENDENT_AMBULATORY_CARE_PROVIDER_SITE_OTHER): Payer: Medicare Other

## 2019-05-11 VITALS — Ht 62.0 in | Wt 106.0 lb

## 2019-05-11 DIAGNOSIS — Z0000 Encounter for general adult medical examination without abnormal findings: Secondary | ICD-10-CM

## 2019-05-11 NOTE — Patient Instructions (Signed)
Heather Singh , Thank you for taking time to come for your Medicare Wellness Visit. I appreciate your ongoing commitment to your health goals. Please review the following plan we discussed and let me know if I can assist you in the future.   Screening recommendations/referrals: Colonoscopy: done 05/30/18  Mammogram: done 01/08/19 Bone Density: done 11/30/18 Recommended yearly ophthalmology/optometry visit for glaucoma screening and checkup Recommended yearly dental visit for hygiene and checkup  Vaccinations: Influenza vaccine: done 12/19/18 Pneumococcal vaccine: done 10/21/14 Tdap vaccine: done 03/30/16 Shingles vaccine: Shingrix completed 01/02/18   Covid-19: Completed 04/20/19  Advanced directives: Please bring a copy of your health care power of attorney and living will to the office at your convenience.  Conditions/risks identified: Recommend increasing physical activity as tolerated.   Next appointment: Please follow up in one year for your Medicare Annual Wellness visit.     Preventive Care 23 Years and Older, Female Preventive care refers to lifestyle choices and visits with your health care provider that can promote health and wellness. What does preventive care include?  A yearly physical exam. This is also called an annual well check.  Dental exams once or twice a year.  Routine eye exams. Ask your health care provider how often you should have your eyes checked.  Personal lifestyle choices, including:  Daily care of your teeth and gums.  Regular physical activity.  Eating a healthy diet.  Avoiding tobacco and drug use.  Limiting alcohol use.  Practicing safe sex.  Taking low-dose aspirin every day.  Taking vitamin and mineral supplements as recommended by your health care provider. What happens during an annual well check? The services and screenings done by your health care provider during your annual well check will depend on your age, overall health,  lifestyle risk factors, and family history of disease. Counseling  Your health care provider may ask you questions about your:  Alcohol use.  Tobacco use.  Drug use.  Emotional well-being.  Home and relationship well-being.  Sexual activity.  Eating habits.  History of falls.  Memory and ability to understand (cognition).  Work and work Statistician.  Reproductive health. Screening  You may have the following tests or measurements:  Height, weight, and BMI.  Blood pressure.  Lipid and cholesterol levels. These may be checked every 5 years, or more frequently if you are over 34 years old.  Skin check.  Lung cancer screening. You may have this screening every year starting at age 61 if you have a 30-pack-year history of smoking and currently smoke or have quit within the past 15 years.  Fecal occult blood test (FOBT) of the stool. You may have this test every year starting at age 85.  Flexible sigmoidoscopy or colonoscopy. You may have a sigmoidoscopy every 5 years or a colonoscopy every 10 years starting at age 68.  Hepatitis C blood test.  Hepatitis B blood test.  Sexually transmitted disease (STD) testing.  Diabetes screening. This is done by checking your blood sugar (glucose) after you have not eaten for a while (fasting). You may have this done every 1-3 years.  Bone density scan. This is done to screen for osteoporosis. You may have this done starting at age 104.  Mammogram. This may be done every 1-2 years. Talk to your health care provider about how often you should have regular mammograms. Talk with your health care provider about your test results, treatment options, and if necessary, the need for more tests. Vaccines  Your health care  provider may recommend certain vaccines, such as:  Influenza vaccine. This is recommended every year.  Tetanus, diphtheria, and acellular pertussis (Tdap, Td) vaccine. You may need a Td booster every 10 years.  Zoster  vaccine. You may need this after age 67.  Pneumococcal 13-valent conjugate (PCV13) vaccine. One dose is recommended after age 17.  Pneumococcal polysaccharide (PPSV23) vaccine. One dose is recommended after age 67. Talk to your health care provider about which screenings and vaccines you need and how often you need them. This information is not intended to replace advice given to you by your health care provider. Make sure you discuss any questions you have with your health care provider. Document Released: 03/28/2015 Document Revised: 11/19/2015 Document Reviewed: 12/31/2014 Elsevier Interactive Patient Education  2017 [place] Prevention in the Home Falls can cause injuries. They can happen to people of all ages. There are many things you can do to make your home safe and to help prevent falls. What can I do on the outside of my home?  Regularly fix the edges of walkways and driveways and fix any cracks.  Remove anything that might make you trip as you walk through a door, such as a raised step or threshold.  Trim any bushes or trees on the path to your home.  Use bright outdoor lighting.  Clear any walking paths of anything that might make someone trip, such as rocks or tools.  Regularly check to see if handrails are loose or broken. Make sure that both sides of any steps have handrails.  Any raised decks and porches should have guardrails on the edges.  Have any leaves, snow, or ice cleared regularly.  Use sand or salt on walking paths during winter.  Clean up any spills in your garage right away. This includes oil or grease spills. What can I do in the bathroom?  Use night lights.  Install grab bars by the toilet and in the tub and shower. Do not use towel bars as grab bars.  Use non-skid mats or decals in the tub or shower.  If you need to sit down in the shower, use a plastic, non-slip stool.  Keep the floor dry. Clean up any water that spills on the  floor as soon as it happens.  Remove soap buildup in the tub or shower regularly.  Attach bath mats securely with double-sided non-slip rug tape.  Do not have throw rugs and other things on the floor that can make you trip. What can I do in the bedroom?  Use night lights.  Make sure that you have a light by your bed that is easy to reach.  Do not use any sheets or blankets that are too big for your bed. They should not hang down onto the floor.  Have a firm chair that has side arms. You can use this for support while you get dressed.  Do not have throw rugs and other things on the floor that can make you trip. What can I do in the kitchen?  Clean up any spills right away.  Avoid walking on wet floors.  Keep items that you use a lot in easy-to-reach places.  If you need to reach something above you, use a strong step stool that has a grab bar.  Keep electrical cords out of the way.  Do not use floor polish or wax that makes floors slippery. If you must use wax, use non-skid floor wax.  Do not have throw  rugs and other things on the floor that can make you trip. What can I do with my stairs?  Do not leave any items on the stairs.  Make sure that there are handrails on both sides of the stairs and use them. Fix handrails that are broken or loose. Make sure that handrails are as long as the stairways.  Check any carpeting to make sure that it is firmly attached to the stairs. Fix any carpet that is loose or worn.  Avoid having throw rugs at the top or bottom of the stairs. If you do have throw rugs, attach them to the floor with carpet tape.  Make sure that you have a light switch at the top of the stairs and the bottom of the stairs. If you do not have them, ask someone to add them for you. What else can I do to help prevent falls?  Wear shoes that:  Do not have high heels.  Have rubber bottoms.  Are comfortable and fit you well.  Are closed at the toe. Do not wear  sandals.  If you use a stepladder:  Make sure that it is fully opened. Do not climb a closed stepladder.  Make sure that both sides of the stepladder are locked into place.  Ask someone to hold it for you, if possible.  Clearly mark and make sure that you can see:  Any grab bars or handrails.  First and last steps.  Where the edge of each step is.  Use tools that help you move around (mobility aids) if they are needed. These include:  Canes.  Walkers.  Scooters.  Crutches.  Turn on the lights when you go into a dark area. Replace any light bulbs as soon as they burn out.  Set up your furniture so you have a clear path. Avoid moving your furniture around.  If any of your floors are uneven, fix them.  If there are any pets around you, be aware of where they are.  Review your medicines with your doctor. Some medicines can make you feel dizzy. This can increase your chance of falling. Ask your doctor what other things that you can do to help prevent falls. This information is not intended to replace advice given to you by your health care provider. Make sure you discuss any questions you have with your health care provider. Document Released: 12/26/2008 Document Revised: 08/07/2015 Document Reviewed: 04/05/2014 Elsevier Interactive Patient Education  2017 [employer].

## 2019-05-11 NOTE — Progress Notes (Signed)
Subjective:   Heather Singh is a 74 y.o. female who presents for Medicare Annual (Subsequent) preventive examination.  Virtual Visit via Telephone Note  I connected with Heather Singh on 05/11/19 at 10:00 AM EST by telephone and verified that I am speaking with the correct person using two identifiers.  Medicare Annual Wellness visit completed telephonically due to Covid-19 pandemic.   Location: Patient: home Provider: office   I discussed the limitations, risks, security and privacy concerns of performing an evaluation and management service by telephone and the availability of in person appointments. The patient expressed understanding and agreed to proceed.  Some vital signs may be absent or patient reported.   Clemetine Marker, LPN    Review of Systems:   Cardiac Risk Factors include: advanced age (>72men, >55 women);dyslipidemia     Objective:     Vitals: Ht 5\' 2"  (1.575 m)   Wt 106 lb (48.1 kg)   BMI 19.39 kg/m   Body mass index is 19.39 kg/m.  Advanced Directives 05/11/2019 02/24/2019 04/28/2018 10/11/2016 09/21/2016 09/11/2016 08/16/2016  Does Patient Have a Medical Advance Directive? Yes No;Yes Yes Yes Yes Yes Yes  Type of Paramedic of [place];Living will [place];Living will [place];Living will [place];Living will [place];Living will - [place];Living will  Does patient want to make changes to medical advance directive? - - - - - - -  Copy of [place] in Chart? No - copy requested - No - copy requested No - copy requested No - copy requested - -    Tobacco Social History   Tobacco Use  Smoking Status Never Smoker  Smokeless Tobacco Never Used     Counseling given: Not Answered   Clinical Intake:  Pre-visit preparation completed: Yes  Pain : No/denies pain     BMI - recorded: 19.39 Nutritional Status: BMI  <19  Underweight Nutritional Risks: None Diabetes: No  How often do you need to have someone help you when you read instructions, pamphlets, or other written materials from your doctor or pharmacy?: 1 - Never  Interpreter Needed?: No  Information entered by :: Clemetine Marker LPN  Past Medical History:  Diagnosis Date  . Allergy   . Back pain   . Dyslipidemia   . Fibrocystic breast disease   . GERD (gastroesophageal reflux disease)   . Hyperlipidemia   . Menopause   . Osteoporosis   . Reflux esophagitis   . RLS (restless legs syndrome)   . Urinary dysfunction    Past Surgical History:  Procedure Laterality Date  . BREAST CYST ASPIRATION Left   . BREAST EXCISIONAL BIOPSY Left 1980's   neg  . CATARACT EXTRACTION    . EYE SURGERY     left  . KYPHOPLASTY N/A 08/19/2016   Procedure: KYPHOPLASTY L3,L4;  Surgeon: Hessie Knows, MD;  Location: [facility] ORS;  Service: Orthopedics;  Laterality: N/A;  . KYPHOPLASTY N/A 09/21/2016   Procedure: [id number];  Surgeon: Hessie Knows, MD;  Location: [facility] ORS;  Service: Orthopedics;  Laterality: N/A;  . MOUTH SURGERY    . POLYPECTOMY    . SLING BLADE PROCEDURE    . TOTAL ABDOMINAL HYSTERECTOMY     Family History  Problem Relation Age of Onset  . Transient ischemic attack Mother   . Atrial fibrillation Mother   . Arthritis Mother        OA  . Heart disease Father  CHF  . CAD Father   . Hyperlipidemia Sister   . Arthritis Sister        OA  . Drug abuse Son   . Hypertension Son   . Stroke Sister        Lived in a nursing home and died from COVID-9  . Arthritis Sister        OA  . Heart failure Other   . Coronary artery disease Other   . Stroke Other   . Hyperlipidemia Other   . Hypertension Other   . Thyroid disease Other   . Breast cancer Maternal Aunt   . Breast cancer Paternal Aunt    Social History   Socioeconomic History  . Marital status: Married    Spouse name: Not on file  . Number of children: 1  . Years  of education: Not on file  . Highest education level: High school graduate  Occupational History  . Not on file  Tobacco Use  . Smoking status: Never Smoker  . Smokeless tobacco: Never Used  Substance and Sexual Activity  . Alcohol use: No    Alcohol/week: 0.0 standard drinks  . Drug use: No  . Sexual activity: Yes    Partners: Male  Other Topics Concern  . Not on file  Social History Narrative   Married   Gets regular exercise   Social Determinants of Health   Financial Resource Strain: Low Risk   . Difficulty of Paying Living Expenses: Not hard at all  Food Insecurity: No Food Insecurity  . Worried About Charity fundraiser in the Last Year: Never true  . Ran Out of Food in the Last Year: Never true  Transportation Needs: No Transportation Needs  . Lack of Transportation (Medical): No  . Lack of Transportation (Non-Medical): No  Physical Activity: Inactive  . Days of Exercise per Week: 0 days  . Minutes of Exercise per Session: 0 min  Stress: No Stress Concern Present  . Feeling of Stress : Not at all  Social Connections: Not Isolated  . Frequency of Communication with Friends and Family: More than three times a week  . Frequency of Social Gatherings with Friends and Family: Never  . Attends Religious Services: More than 4 times per year  . Active Member of Clubs or Organizations: Yes  . Attends Archivist Meetings: More than 4 times per year  . Marital Status: Married    Outpatient Encounter Medications as of 05/11/2019  Medication Sig  . calcium carbonate (TUMS EX) 750 MG chewable tablet Chew 1 tablet by mouth daily.  . Cholecalciferol (VITAMIN D) 2000 units CAPS Take 1 capsule by mouth daily.  Marland Kitchen denosumab (PROLIA) 60 MG/ML SOSY injection Inject 60 mg into the skin every 6 (six) months.  . levothyroxine (SYNTHROID) 25 MCG tablet Take 1 tablet (25 mcg total) by mouth daily before breakfast.  . loratadine (CLARITIN) 10 MG tablet Take 1 tablet by mouth  daily.  . Magnesium 400 MG TABS Take 1 tablet by mouth daily.  Marland Kitchen omeprazole (PRILOSEC) 40 MG capsule Take 1 capsule (40 mg total) by mouth daily.  . Polyethylene Glycol 400 (BLINK TEARS) 0.25 % SOLN Place 1 drop into both eyes 2 (two) times daily as needed (dry eyes).  . pravastatin (PRAVACHOL) 20 MG tablet Take 1 tablet (20 mg total) by mouth daily.  . vitamin B-12 (CYANOCOBALAMIN) 1000 MCG tablet Take 1,000 mcg by mouth 3 (three) times a week.  . [DISCONTINUED] Insulin Pen  Needle (COMFORT EZ PEN NEEDLES) 33G X 8 MM MISC Use 1 each once daily.  . [DISCONTINUED] triamcinolone cream (KENALOG) 0.1 %    No facility-administered encounter medications on file as of 05/11/2019.    Activities of Daily Living In your present state of health, do you have any difficulty performing the following activities: 05/11/2019 05/04/2019  Hearing? Y N  Comment wears hearing aids -  Vision? Y Y  Difficulty concentrating or making decisions? N N  Walking or climbing stairs? N N  Dressing or bathing? N N  Doing errands, shopping? N N  Preparing Food and eating ? N -  Using the Toilet? N -  In the past six months, have you accidently leaked urine? Y -  Comment wears pads for protection -  Do you have problems with loss of bowel control? N -  Managing your Medications? N -  Managing your Finances? N -  Housekeeping or managing your Housekeeping? N -  Some recent data might be hidden    Patient Care Team: Steele Sizer, MD as PCP - General (Family Medicine) Beverly Gust, MD as Consulting Physician (Otolaryngology) Gabriel Carina Betsey Holiday, MD as Consulting Physician (Endocrinology) Manya Silvas, MD (Inactive) (Gastroenterology) Brendolyn Patty, MD (Dermatology) Sharlotte Alamo, DPM (Podiatry)    Assessment:   This is a routine wellness examination for Russie.  Exercise Activities and Dietary recommendations Current Exercise Habits: The patient does not participate in regular exercise at present(recovering  from broken foot), Type of exercise: walking, Intensity: Mild, Exercise limited by: orthopedic condition(s)  Goals    . DIET - INCREASE WATER INTAKE     Recommend drinking 6-8 glasses of water per day       Fall Risk Fall Risk  05/11/2019 05/04/2019 10/27/2018 07/31/2018 04/28/2018  Falls in the past year? 0 0 0 0 0  Number falls in past yr: 0 0 0 0 0  Injury with Fall? 0 0 0 0 0  Follow up Falls prevention discussed - - - -   FALL RISK PREVENTION PERTAINING TO THE HOME:  Any stairs in or around the home? Yes  If so, do they handrails? Yes   Home free of loose throw rugs in walkways, pet beds, electrical cords, etc? Yes  Adequate lighting in your home to reduce risk of falls? Yes   ASSISTIVE DEVICES UTILIZED TO PREVENT FALLS:  Life alert? No  Use of a cane, walker or w/c? No  Grab bars in the bathroom? No  Shower chair or bench in shower? No  Elevated toilet seat or a handicapped toilet? Yes   DME ORDERS:  DME order needed?  No   TIMED UP AND GO:  Was the test performed? No . Telephonic visit.   Education: Fall risk prevention has been discussed.  Intervention(s) required? No   Depression Screen PHQ 2/9 Scores 05/11/2019 05/04/2019 10/27/2018 07/31/2018  PHQ - 2 Score 0 0 0 0  PHQ- 9 Score - 0 0 0     Cognitive Function     6CIT Screen 05/11/2019 04/28/2018  What Year? 0 points 0 points  What month? 0 points 0 points  What time? 0 points 0 points  Count back from 20 0 points 0 points  Months in reverse 0 points 0 points  Repeat phrase 0 points 0 points  Total Score 0 0    Immunization History  Administered Date(s) Administered  . Fluad Quad(high Dose 65+) 12/19/2018  . Influenza Split 02/25/2009, 11/27/2012  . Influenza, High Dose  Seasonal PF 11/13/2014, 11/17/2016  . Influenza, Seasonal, Injecte, Preservative Fre 12/01/2009, 12/30/2010, 11/19/2011, 12/20/2013  . Influenza,inj,Quad PF,6+ Mos 01/07/2016  . Influenza-Unspecified 02/02/2018  . PFIZER SARS-COV-2  Vaccination 03/30/2019, 04/20/2019  . Pneumococcal Conjugate-13 10/21/2014  . Pneumococcal Polysaccharide-23 07/29/2008  . Tdap 07/26/2007, 03/30/2016  . Zoster 02/25/2009  . Zoster Recombinat (Shingrix) 10/21/2017, 01/02/2018    Qualifies for Shingles Vaccine? Shingrix series completed.   Tdap: Up to date  Flu Vaccine: Up to date  Pneumococcal Vaccine: Up to date   Screening Tests Health Maintenance  Topic Date Due  . COLONOSCOPY  05/30/2023  . TETANUS/TDAP  03/30/2026  . INFLUENZA VACCINE  Completed  . DEXA SCAN  Completed  . Hepatitis C Screening  Completed  . PNA vac Low Risk Adult  Completed  . COLON CANCER SCREENING ANNUAL FOBT  Discontinued    Cancer Screenings:  Colorectal Screening: Completed 05/30/18. Repeat every 5 years;   Mammogram: Completed 01/08/19. Repeat every year;   Bone Density: Completed 11/30/18. Results reflect OSTEOPOROSIS. Repeat every 2 years.   Lung Cancer Screening: (Low Dose CT Chest recommended if Age 19-80 years, 30 pack-year currently smoking OR have quit w/in 15years.) does not qualify.   Additional Screening:  Hepatitis C Screening: does qualify; Completed 12/28/11  Vision Screening: Recommended annual ophthalmology exams for early detection of glaucoma and other disorders of the eye. Is the patient up to date with their annual eye exam?  Yes  Who is the provider or what is the name of the office in which the pt attends annual eye exams? [place] Screening: Recommended annual dental exams for proper oral hygiene  Community Resource Referral:  CRR required this visit?  No      Plan:     I have personally reviewed and addressed the Medicare Annual Wellness questionnaire and have noted the following in the patient's chart:  A. Medical and social history B. Use of alcohol, tobacco or illicit drugs  C. Current medications and supplements D. Functional ability and status E.  Nutritional status F.  Physical  activity G. Advance directives H. List of other physicians I.  Hospitalizations, surgeries, and ER visits in previous 12 months J.  [place] such as hearing and vision if needed, cognitive and depression L. Referrals and appointments   In addition, I have reviewed and discussed with patient certain preventive protocols, quality metrics, and best practice recommendations. A written personalized care plan for preventive services as well as general preventive health recommendations were provided to patient.   Signed,  Clemetine Marker, LPN Nurse Health Advisor   Nurse Notes: pt doing well and appreciative of visit today

## 2019-05-21 DIAGNOSIS — H40003 Preglaucoma, unspecified, bilateral: Secondary | ICD-10-CM | POA: Diagnosis not present

## 2019-07-17 DIAGNOSIS — M810 Age-related osteoporosis without current pathological fracture: Secondary | ICD-10-CM | POA: Diagnosis not present

## 2019-07-21 ENCOUNTER — Other Ambulatory Visit: Payer: Self-pay | Admitting: Family Medicine

## 2019-07-21 DIAGNOSIS — E785 Hyperlipidemia, unspecified: Secondary | ICD-10-CM

## 2019-07-21 DIAGNOSIS — K219 Gastro-esophageal reflux disease without esophagitis: Secondary | ICD-10-CM

## 2019-08-26 ENCOUNTER — Encounter: Payer: Self-pay | Admitting: Family Medicine

## 2019-08-27 ENCOUNTER — Other Ambulatory Visit: Payer: Self-pay | Admitting: Family Medicine

## 2019-08-27 DIAGNOSIS — E039 Hypothyroidism, unspecified: Secondary | ICD-10-CM

## 2019-08-27 MED ORDER — LEVOTHYROXINE SODIUM 25 MCG PO TABS: 25.0000 ug | ORAL_TABLET | Freq: Every day | ORAL | 0 refills | Status: DC

## 2019-10-23 ENCOUNTER — Ambulatory Visit (INDEPENDENT_AMBULATORY_CARE_PROVIDER_SITE_OTHER): Payer: Medicare Other | Admitting: Dermatology

## 2019-10-23 ENCOUNTER — Other Ambulatory Visit: Payer: Self-pay

## 2019-10-23 DIAGNOSIS — L821 Other seborrheic keratosis: Secondary | ICD-10-CM | POA: Diagnosis not present

## 2019-10-23 DIAGNOSIS — L820 Inflamed seborrheic keratosis: Secondary | ICD-10-CM | POA: Diagnosis not present

## 2019-10-23 DIAGNOSIS — D229 Melanocytic nevi, unspecified: Secondary | ICD-10-CM

## 2019-10-23 NOTE — Progress Notes (Signed)
   Follow-Up Visit   Subjective  Heather Singh is a 74 y.o. female who presents for the following: Lesion (Patient here for spots of concern on her back.).  Patient not sure how long spots at back have been there but they have become red and itchy. She has been using Vaseline on areas.   The following portions of the chart were reviewed this encounter and updated as appropriate:  Tobacco  Allergies  Meds  Problems  Med Hx  Surg Hx  Fam Hx      Review of Systems:  No other skin or systemic complaints except as noted in HPI or Assessment and Plan.  Objective  Well appearing patient in no apparent distress; mood and affect are within normal limits.  A focused examination was performed including back. Relevant physical exam findings are noted in the Assessment and Plan.  Objective  Back (11): Erythematous keratotic or waxy stuck-on papule or plaque.    Assessment & Plan  Inflamed seborrheic keratosis (11) Back  Destruction of lesion - Back Complexity: simple   Destruction method: cryotherapy   Informed consent: discussed and consent obtained   Lesion destroyed using liquid nitrogen: Yes   Cryotherapy cycles:  2 Outcome: patient tolerated procedure well with no complications   Post-procedure details: wound care instructions given     Seborrheic Keratoses - Stuck-on, waxy, tan-brown papules and plaques  - Discussed benign etiology and prognosis. - Observe - Call for any changes  Melanocytic Nevi - Tan-brown and/or pink-flesh-colored symmetric macules and papules - Benign appearing on exam today - Observation - Call clinic for new or changing moles - Recommend daily use of broad spectrum spf 30+ sunscreen to sun-exposed areas.   Return as scheduled, for TBSE.  Graciella Belton, RMA, am acting as scribe for Forest Gleason, MD .  Documentation: I have reviewed the above documentation for accuracy and completeness, and I agree with the above.  Forest Gleason,  MD

## 2019-11-01 NOTE — Progress Notes (Addendum)
Name: Heather Singh   MRN: [id number]    DOB: 03-28-1945   Date:11/02/2019       Progress Note  Subjective  Chief Complaint  Chief Complaint  Patient presents with  . Follow-up    6 month check up    HPI    Osteoporosis: she has been seeing Dr. Tereasa Coop started 10/2016. She is tolerating medication well, she had a bone density done by Dr. Roselie Skinner her first Prolia Fall 2020, she stepped on a brass claw on Dec 12 th and broke three metatarsal bones and had to go to [facility] seen by Dr. Cleda Mccreedy, she was placed on a Ortho shoe and on her last visit with him this week she was released to wear regular shoes. She is doing [place] and is feeling much better, pain on her left foot is zero with rest, but can be present during activity   GERDand hiatal hernia: Has been seeing Dr. Wilhemena Durie he advisedOmeprazole 40mg . Says symptoms have been well controlled at this dose. Still has a dry cough,but denies heartburn or regurgitation lately. She states symptoms better controlled when she eats smaller portion, we will try tessalon prn    Hypothyroidism:she is taking levothyroxine as recommended. Last TSH was 10/2018  and at goal.She states skin is not as dry as it was last Fall. She using a better moisturizer suggested by Dermatologist - Dr. Nicole Kindred   Hyperlipidemia:TakingPravachol, LDL still above 130, but she does not have major risk factors . Noside effectsfrom medications, no joint/muscle aches. Continue medication. Discussed trying a different statin , but she is worried about side effects, she has been taking magnesium for nocturnal muscle cramps, discussed CoQ10 and explained we can add Zetia   Chronic constipation:doing well on Miralax , se is now taking it a few times a week otherwise gives her diarrhea  Bristol scale 4 when she takes medication sometimes it is a 3 ,she has been drinking 6-7 glasses of water dailyUnchanged   Patient Active Problem List   Diagnosis Date Noted   . Hx of adenomatous colonic polyps 05/19/2018  . Senile purpura ([place]) 04/28/2018  . Closed compression fracture of L3 lumbar vertebra, sequela 08/23/2016  . Atrophic vaginitis 06/05/2015  . Acquired hypothyroidism 10/19/2014  . Perennial allergic rhinitis 10/19/2014  . B12 deficiency 10/19/2014  . Fibrocystic breast disease 10/19/2014  . Gastro-esophageal reflux disease without esophagitis 10/19/2014  . OP (osteoporosis) 10/19/2014  . Ovarian failure 10/19/2014  . Hiatal hernia 10/19/2014  . Restless legs syndrome 10/19/2014  . ABNORMAL EKG 09/03/2009  . Dyslipidemia 08/23/2008  . Hyperlipidemia 08/23/2008  . Vitamin D deficiency 10/10/2007    Past Surgical History:  Procedure Laterality Date  . BREAST CYST ASPIRATION Left   . BREAST EXCISIONAL BIOPSY Left 1980's   neg  . CATARACT EXTRACTION    . EYE SURGERY     left  . KYPHOPLASTY N/A 08/19/2016   Procedure: KYPHOPLASTY L3,L4;  Surgeon: Hessie Knows, MD;  Location: [facility] ORS;  Service: Orthopedics;  Laterality: N/A;  . KYPHOPLASTY N/A 09/21/2016   Procedure: [id number];  Surgeon: Hessie Knows, MD;  Location: [facility] ORS;  Service: Orthopedics;  Laterality: N/A;  . MOUTH SURGERY    . POLYPECTOMY    . SLING BLADE PROCEDURE    . TOTAL ABDOMINAL HYSTERECTOMY      Family History  Problem Relation Age of Onset  . Transient ischemic attack Mother   . Atrial fibrillation Mother   . Arthritis Mother  OA  . Heart disease Father        CHF  . CAD Father   . Hyperlipidemia Sister   . Arthritis Sister        OA  . Drug abuse Son   . Hypertension Son   . Stroke Sister        Lived in a nursing home and died from COVID-57  . Arthritis Sister        OA  . Heart failure Other   . Coronary artery disease Other   . Stroke Other   . Hyperlipidemia Other   . Hypertension Other   . Thyroid disease Other   . Breast cancer Maternal Aunt   . Breast cancer Paternal Aunt     Social History   Tobacco Use  . Smoking  status: Never Smoker  . Smokeless tobacco: Never Used  Substance Use Topics  . Alcohol use: No    Alcohol/week: 0.0 standard drinks     Current Outpatient Medications:  .  calcium carbonate (TUMS EX) 750 MG chewable tablet, Chew 1 tablet by mouth daily., Disp: , Rfl:  .  Cholecalciferol (VITAMIN D) 2000 units CAPS, Take 1 capsule by mouth daily., Disp: , Rfl:  .  denosumab (PROLIA) 60 MG/ML SOSY injection, Inject 60 mg into the skin every 6 (six) months., Disp: , Rfl:  .  levothyroxine (SYNTHROID) 25 MCG tablet, Take 1 tablet (25 mcg total) by mouth daily before breakfast. Alternating with 50 mcg every other day, Disp: 135 tablet, Rfl: 0 .  loratadine (CLARITIN) 10 MG tablet, Take 1 tablet by mouth daily., Disp: , Rfl:  .  Magnesium 400 MG TABS, Take 1 tablet by mouth daily., Disp: , Rfl:  .  omeprazole (PRILOSEC) 40 MG capsule, TAKE 1 CAPSULE(40 MG) BY MOUTH EVERY MORNING, Disp: 90 capsule, Rfl: 1 .  Polyethylene Glycol 400 (BLINK TEARS) 0.25 % SOLN, Place 1 drop into both eyes 2 (two) times daily as needed (dry eyes)., Disp: , Rfl:  .  pravastatin (PRAVACHOL) 20 MG tablet, TAKE 1 TABLET(20 MG) BY MOUTH DAILY, Disp: 90 tablet, Rfl: 1 .  vitamin B-12 (CYANOCOBALAMIN) 1000 MCG tablet, Take 1,000 mcg by mouth 3 (three) times a week., Disp: , Rfl:   Allergies  Allergen Reactions  . Alendronate     Dyspepsia, inflamed esophagus   . Levofloxacin Hives  . Nitrofurantoin Monohyd Macro Hives  . Sulfamethoxazole-Trimethoprim Hives  . Etodolac Hives  . Penicillins Hives and Rash    Has patient had a PCN reaction causing immediate rash, facial/tongue/throat swelling, SOB or lightheadedness with hypotension: No Has patient had a PCN reaction causing severe rash involving mucus membranes or skin necrosis: No Has patient had a PCN reaction that required hospitalization: No Has patient had a PCN reaction occurring within the last 10 years: No If all of the above answers are "NO", then may proceed  with Cephalosporin use.     I personally reviewed active problem list, medication list, allergies, family history, social history, health maintenance with the patient/caregiver today.  Constitutional: Negative for fever or weight change.  Respiratory: Positive  for cough but no  shortness of breath.   Cardiovascular: Negative for chest pain or palpitations.  Gastrointestinal: Negative for abdominal pain, no bowel changes.  Musculoskeletal: Negative for gait problem or joint swelling.  Skin: Negative for rash.  Neurological: Negative for dizziness or headache.  No other specific complaints in a complete review of systems (except as listed in HPI above).  Objective  Vitals:   11/02/19 1011  Height: 5\' 2"  (1.575 m)    Body mass index is 19.39 kg/m.  Constitutional: Patient appears well-developed and well-nourished.  No distress.  HEENT: head atraumatic, normocephalic, pupils equal and reactive to light,  neck supple, throat within normal limits Cardiovascular: Normal rate, regular rhythm and normal heart sounds.  No murmur heard. No BLE edema. Pulmonary/Chest: Effort normal and breath sounds normal. No respiratory distress. Abdominal: Soft.  There is no tenderness. Psychiatric: Patient has a normal mood and affect. behavior is normal. Judgment and thought content normal. Skin: senile purple   PHQ2/9: Depression screen [facility] 2/9 05/11/2019 05/04/2019 10/27/2018 07/31/2018 04/28/2018  Decreased Interest 0 0 0 0 0  Down, Depressed, Hopeless 0 0 0 0 0  PHQ - 2 Score 0 0 0 0 0  Altered sleeping - 0 0 0 -  Tired, decreased energy - 0 0 0 -  Change in appetite - 0 0 0 -  Feeling bad or failure about yourself  - 0 0 0 -  Trouble concentrating - 0 0 0 -  Moving slowly or fidgety/restless - 0 0 0 -  Suicidal thoughts - 0 0 0 -  PHQ-9 Score - 0 0 0 -  Difficult doing work/chores - Not difficult at all - Not difficult at all -    phq 9 is negative   Fall Risk: Fall Risk  05/11/2019  05/04/2019 10/27/2018 07/31/2018 04/28/2018  Falls in the past year? 0 0 0 0 0  Number falls in past yr: 0 0 0 0 0  Injury with Fall? 0 0 0 0 0  Follow up Falls prevention discussed - - - -     Assessment & Plan  1. Acquired hypothyroidism  - TSH  2. Senile purpura (HCC)  stable  3. B12 deficiency  Recheck B12   4. Dyslipidemia  - Coenzyme Q10 (COQ10) 100 MG CAPS; Take 1 capsule by mouth daily.  Dispense: 30 capsule; Refill: 0 - Lipid panel - COMPLETE METABOLIC PANEL WITH GFR  5. Age-related osteoporosis without current pathological fracture  - VITAMIN D 25 Hydroxy (Vit-D Deficiency, Fractures)  6. Vitamin D deficiency  - VITAMIN D 25 Hydroxy (Vit-D Deficiency, Fractures)  7. Chronic constipation  stable  8. Gastroesophageal reflux disease without esophagitis   9. Cough  - benzonatate (TESSALON) 100 MG capsule; Take 1-2 capsules (100-200 mg total) by mouth 2 (two) times daily as needed.  Dispense: 40 capsule; Refill: 0  CXR   10. Anemia, unspecified type  - CBC with Differential/Platelet  11. Hyperglycemia  - Hemoglobin A1c  12. Muscle cramp  - Magnesium

## 2019-11-02 ENCOUNTER — Encounter: Payer: Self-pay | Admitting: Family Medicine

## 2019-11-02 ENCOUNTER — Ambulatory Visit
Admission: RE | Admit: 2019-11-02 | Discharge: 2019-11-02 | Disposition: A | Discharge status: Home / Self Care | Payer: Medicare Other | Attending: Family Medicine | Admitting: Family Medicine

## 2019-11-02 ENCOUNTER — Other Ambulatory Visit: Payer: Self-pay

## 2019-11-02 ENCOUNTER — Ambulatory Visit
Admission: RE | Admit: 2019-11-02 | Discharge: 2019-11-02 | Disposition: A | Discharge status: Home / Self Care | Payer: Medicare Other | Source: Ambulatory Visit | Attending: Family Medicine | Admitting: Family Medicine

## 2019-11-02 ENCOUNTER — Ambulatory Visit (INDEPENDENT_AMBULATORY_CARE_PROVIDER_SITE_OTHER): Payer: Medicare Other | Admitting: Family Medicine

## 2019-11-02 VITALS — BP 110/80 | HR 75 | Temp 98.5°F | Ht 62.0 in | Wt 106.0 lb

## 2019-11-02 DIAGNOSIS — R059 Cough, unspecified: Secondary | ICD-10-CM

## 2019-11-02 DIAGNOSIS — E785 Hyperlipidemia, unspecified: Secondary | ICD-10-CM

## 2019-11-02 DIAGNOSIS — K219 Gastro-esophageal reflux disease without esophagitis: Secondary | ICD-10-CM | POA: Diagnosis not present

## 2019-11-02 DIAGNOSIS — E559 Vitamin D deficiency, unspecified: Secondary | ICD-10-CM

## 2019-11-02 DIAGNOSIS — D692 Other nonthrombocytopenic purpura: Secondary | ICD-10-CM

## 2019-11-02 DIAGNOSIS — R05 Cough: Secondary | ICD-10-CM

## 2019-11-02 DIAGNOSIS — E039 Hypothyroidism, unspecified: Secondary | ICD-10-CM | POA: Diagnosis not present

## 2019-11-02 DIAGNOSIS — D649 Anemia, unspecified: Secondary | ICD-10-CM | POA: Diagnosis not present

## 2019-11-02 DIAGNOSIS — F17200 Nicotine dependence, unspecified, uncomplicated: Secondary | ICD-10-CM | POA: Diagnosis not present

## 2019-11-02 DIAGNOSIS — M810 Age-related osteoporosis without current pathological fracture: Secondary | ICD-10-CM | POA: Diagnosis not present

## 2019-11-02 DIAGNOSIS — R252 Cramp and spasm: Secondary | ICD-10-CM

## 2019-11-02 DIAGNOSIS — R739 Hyperglycemia, unspecified: Secondary | ICD-10-CM | POA: Diagnosis not present

## 2019-11-02 DIAGNOSIS — E538 Deficiency of other specified B group vitamins: Secondary | ICD-10-CM | POA: Diagnosis not present

## 2019-11-02 DIAGNOSIS — K5909 Other constipation: Secondary | ICD-10-CM | POA: Diagnosis not present

## 2019-11-02 MED ORDER — BENZONATATE 100 MG PO CAPS: 100.0000 mg | ORAL_CAPSULE | Freq: Two times a day (BID) | ORAL | 0 refills | Status: DC | PRN

## 2019-11-02 MED ORDER — COQ10 100 MG PO CAPS: 1.0000 | ORAL_CAPSULE | Freq: Every day | ORAL | 0 refills | Status: DC

## 2019-11-02 NOTE — Addendum Note (Signed)
Addended by: Steele Sizer F on: 11/02/2019 11:06 AM   Modules accepted: Orders

## 2019-11-03 LAB — CBC WITH DIFFERENTIAL/PLATELET
Absolute Monocytes: 423 cells/uL (ref 200–950)
Basophils Absolute: 20 cells/uL (ref 0–200)
Basophils Relative: 0.4 %
Eosinophils Absolute: 41 cells/uL (ref 15–500)
Eosinophils Relative: 0.8 %
HCT: 36.8 % (ref 35.0–45.0)
Hemoglobin: 12.2 g/dL (ref 11.7–15.5)
Lymphs Abs: 1612 cells/uL (ref 850–3900)
MCH: 32.7 pg (ref 27.0–33.0)
MCHC: 33.2 g/dL (ref 32.0–36.0)
MCV: 98.7 fL (ref 80.0–100.0)
MPV: 10.7 fL (ref 7.5–12.5)
Monocytes Relative: 8.3 %
Neutro Abs: 3004 cells/uL (ref 1500–7800)
Neutrophils Relative %: 58.9 %
Platelets: 237 10*3/uL (ref 140–400)
RBC: 3.73 10*6/uL — ABNORMAL LOW (ref 3.80–5.10)
RDW: 11.9 % (ref 11.0–15.0)
Total Lymphocyte: 31.6 %
WBC: 5.1 10*3/uL (ref 3.8–10.8)

## 2019-11-03 LAB — LIPID PANEL
Cholesterol: 237 mg/dL — ABNORMAL HIGH (ref <200)
HDL: 86 mg/dL (ref >=50)
LDL Cholesterol (Calc): 129 mg/dL (calc) — ABNORMAL HIGH
Non-HDL Cholesterol (Calc): 151 mg/dL (calc) — ABNORMAL HIGH (ref <130)
Total CHOL/HDL Ratio: 2.8 (calc) (ref <5.0)
Triglycerides: 117 mg/dL (ref <150)

## 2019-11-03 LAB — COMPLETE METABOLIC PANEL WITH GFR
AG Ratio: 2.1 (calc) (ref 1.0–2.5)
ALT: 8 U/L (ref 6–29)
AST: 17 U/L (ref 10–35)
Albumin: 4.5 g/dL (ref 3.6–5.1)
Alkaline phosphatase (APISO): 36 U/L — ABNORMAL LOW (ref 37–153)
BUN: 17 mg/dL (ref 7–25)
CO2: 31 mmol/L (ref 20–32)
Calcium: 9.7 mg/dL (ref 8.6–10.4)
Chloride: 97 mmol/L — ABNORMAL LOW (ref 98–110)
Creat: 0.74 mg/dL (ref 0.60–0.93)
GFR, Est African American: 91 mL/min/{1.73_m2} (ref >=60)
GFR, Est Non African American: 79 mL/min/{1.73_m2} (ref >=60)
Globulin: 2.1 g/dL (calc) (ref 1.9–3.7)
Glucose, Bld: 96 mg/dL (ref 65–139)
Potassium: 4 mmol/L (ref 3.5–5.3)
Sodium: 135 mmol/L (ref 135–146)
Total Bilirubin: 0.4 mg/dL (ref 0.2–1.2)
Total Protein: 6.6 g/dL (ref 6.1–8.1)

## 2019-11-03 LAB — HEMOGLOBIN A1C
Hgb A1c MFr Bld: 5.4 % of total Hgb (ref <5.7)
Mean Plasma Glucose: 108 (calc)
eAG (mmol/L): 6 (calc)

## 2019-11-03 LAB — TSH: TSH: 1.36 mIU/L (ref 0.40–4.50)

## 2019-11-03 LAB — VITAMIN B12: Vitamin B-12: 657 pg/mL (ref 200–1100)

## 2019-11-03 LAB — MAGNESIUM: Magnesium: 2.1 mg/dL (ref 1.5–2.5)

## 2019-11-03 LAB — VITAMIN D 25 HYDROXY (VIT D DEFICIENCY, FRACTURES): Vit D, 25-Hydroxy: 55 ng/mL (ref 30–100)

## 2019-11-04 ENCOUNTER — Encounter: Payer: Self-pay | Admitting: Dermatology

## 2019-11-05 ENCOUNTER — Encounter: Payer: Self-pay | Admitting: Family Medicine

## 2019-11-06 ENCOUNTER — Other Ambulatory Visit: Payer: Self-pay | Admitting: Family Medicine

## 2019-11-06 DIAGNOSIS — R9389 Abnormal findings on diagnostic imaging of other specified body structures: Secondary | ICD-10-CM

## 2019-11-06 DIAGNOSIS — R053 Chronic cough: Secondary | ICD-10-CM

## 2019-11-06 DIAGNOSIS — I700 Atherosclerosis of aorta: Secondary | ICD-10-CM

## 2019-11-06 MED ORDER — ATORVASTATIN CALCIUM 40 MG PO TABS: 40.0000 mg | ORAL_TABLET | Freq: Every day | ORAL | 1 refills | Status: DC

## 2019-11-13 DIAGNOSIS — R05 Cough: Secondary | ICD-10-CM | POA: Diagnosis not present

## 2019-11-13 DIAGNOSIS — J310 Chronic rhinitis: Secondary | ICD-10-CM | POA: Diagnosis not present

## 2019-11-13 DIAGNOSIS — K219 Gastro-esophageal reflux disease without esophagitis: Secondary | ICD-10-CM | POA: Diagnosis not present

## 2019-11-20 DIAGNOSIS — H40003 Preglaucoma, unspecified, bilateral: Secondary | ICD-10-CM | POA: Diagnosis not present

## 2019-11-23 ENCOUNTER — Ambulatory Visit (INDEPENDENT_AMBULATORY_CARE_PROVIDER_SITE_OTHER): Payer: Medicare Other | Admitting: Emergency Medicine

## 2019-11-23 ENCOUNTER — Other Ambulatory Visit: Payer: Self-pay

## 2019-11-23 DIAGNOSIS — Z23 Encounter for immunization: Secondary | ICD-10-CM | POA: Diagnosis not present

## 2019-11-26 ENCOUNTER — Other Ambulatory Visit: Payer: Self-pay | Admitting: Family Medicine

## 2019-11-26 DIAGNOSIS — E039 Hypothyroidism, unspecified: Secondary | ICD-10-CM

## 2019-11-27 DIAGNOSIS — H16223 Keratoconjunctivitis sicca, not specified as Sjogren's, bilateral: Secondary | ICD-10-CM | POA: Diagnosis not present

## 2019-12-04 DIAGNOSIS — R05 Cough: Secondary | ICD-10-CM | POA: Diagnosis not present

## 2019-12-04 DIAGNOSIS — J310 Chronic rhinitis: Secondary | ICD-10-CM | POA: Diagnosis not present

## 2019-12-04 DIAGNOSIS — J4520 Mild intermittent asthma, uncomplicated: Secondary | ICD-10-CM | POA: Diagnosis not present

## 2019-12-04 DIAGNOSIS — K219 Gastro-esophageal reflux disease without esophagitis: Secondary | ICD-10-CM | POA: Diagnosis not present

## 2019-12-05 DIAGNOSIS — M810 Age-related osteoporosis without current pathological fracture: Secondary | ICD-10-CM | POA: Diagnosis not present

## 2019-12-11 ENCOUNTER — Other Ambulatory Visit: Payer: Self-pay | Admitting: Family Medicine

## 2019-12-11 DIAGNOSIS — Z1231 Encounter for screening mammogram for malignant neoplasm of breast: Secondary | ICD-10-CM

## 2020-01-17 DIAGNOSIS — M810 Age-related osteoporosis without current pathological fracture: Secondary | ICD-10-CM | POA: Diagnosis not present

## 2020-01-19 ENCOUNTER — Other Ambulatory Visit: Payer: Self-pay | Admitting: Family Medicine

## 2020-01-19 DIAGNOSIS — K219 Gastro-esophageal reflux disease without esophagitis: Secondary | ICD-10-CM

## 2020-01-19 DIAGNOSIS — E785 Hyperlipidemia, unspecified: Secondary | ICD-10-CM

## 2020-02-13 ENCOUNTER — Ambulatory Visit
Admission: RE | Admit: 2020-02-13 | Discharge: 2020-02-13 | Disposition: A | Discharge status: Home / Self Care | Payer: Medicare Other | Source: Ambulatory Visit | Attending: Family Medicine | Admitting: Family Medicine

## 2020-02-13 ENCOUNTER — Other Ambulatory Visit: Payer: Self-pay

## 2020-02-13 DIAGNOSIS — Z1231 Encounter for screening mammogram for malignant neoplasm of breast: Secondary | ICD-10-CM

## 2020-03-04 DIAGNOSIS — R053 Chronic cough: Secondary | ICD-10-CM | POA: Diagnosis not present

## 2020-03-04 DIAGNOSIS — K219 Gastro-esophageal reflux disease without esophagitis: Secondary | ICD-10-CM | POA: Diagnosis not present

## 2020-03-04 DIAGNOSIS — J310 Chronic rhinitis: Secondary | ICD-10-CM | POA: Diagnosis not present

## 2020-05-01 ENCOUNTER — Other Ambulatory Visit: Payer: Self-pay | Admitting: Internal Medicine

## 2020-05-01 DIAGNOSIS — M810 Age-related osteoporosis without current pathological fracture: Secondary | ICD-10-CM

## 2020-05-02 NOTE — Progress Notes (Signed)
Name: Heather Singh   MRN: [id number]    DOB: [birth year]   Date:05/05/2020       Progress Note  Subjective  Chief Complaint  Follow up  HPI  Osteoporosis: she has been seeing Dr. Tereasa Coop started 10/2016. She is tolerating medication well, she had a bone density done by Dr. Roselie Skinner her first Prolia Fall 2020 and went back in 2021, due for repeat bone density in 11/2020.She is doing [place] for activity.  GERDand hiatal hernia: Has been seeing Dr. Wilhemena Durie he advisedOmeprazole 40mg . Says symptoms have been well controlled at this dose. Still has a dry cough,but denies heartburn or regurgitation lately. She saw pulmonologist , Dr. Raul Del Fall 2021. He changed from loratadine to fexofenadine but she continues to have a cough, she states it does not bother her, but her husband states seems worse after she eats, it also causes some hoarseness after meals.   Hypothyroidism:she is taking levothyroxine as recommended. Last TSH was 10/2018  and at goal.She states skin is not as dry . She using a better moisturizer suggested by Dermatologist - Dr. Nicole Kindred . She has chronic constipation takes Miralax. She has gained a little weight. Taking 2 alternating with one pill every other day   Hyperlipidemia/Atherosclerosis of aorta :She was on pravastatin and last LDL slightly better down from 137 to 129  but she does not have major risk factors . Noside effectsfrom medications, no joint/muscle aches. Continue medication. She is now on Atorvastatin 40 mg and we will recheck labs   Senile purpura: stable, usually on arms   Chronic constipation:doing well on Miralax , se is now taking it a few times a week otherwise gives her diarrhea  Bristol scale 4 when she takes medication sometimes it is a 3 ,she has been drinking 6-7 glasses of water dailyStable   Patient Active Problem List   Diagnosis Date Noted  . Hx of adenomatous colonic polyps 05/19/2018  . Senile purpura ([place]) 04/28/2018   . Closed compression fracture of L3 lumbar vertebra, sequela 08/23/2016  . Atrophic vaginitis 06/05/2015  . Acquired hypothyroidism 10/19/2014  . Perennial allergic rhinitis 10/19/2014  . B12 deficiency 10/19/2014  . Fibrocystic breast disease 10/19/2014  . Gastro-esophageal reflux disease without esophagitis 10/19/2014  . OP (osteoporosis) 10/19/2014  . Ovarian failure 10/19/2014  . Hiatal hernia 10/19/2014  . Restless legs syndrome 10/19/2014  . ABNORMAL EKG 09/03/2009  . Dyslipidemia 08/23/2008  . Hyperlipidemia 08/23/2008  . Vitamin D deficiency 10/10/2007    Past Surgical History:  Procedure Laterality Date  . BREAST CYST ASPIRATION Left   . BREAST EXCISIONAL BIOPSY Left 1980's   neg  . CATARACT EXTRACTION    . EYE SURGERY     left  . KYPHOPLASTY N/A 08/19/2016   Procedure: KYPHOPLASTY L3,L4;  Surgeon: Hessie Knows, MD;  Location: [facility] ORS;  Service: Orthopedics;  Laterality: N/A;  . KYPHOPLASTY N/A 09/21/2016   Procedure: [id number];  Surgeon: Hessie Knows, MD;  Location: [facility] ORS;  Service: Orthopedics;  Laterality: N/A;  . MOUTH SURGERY    . POLYPECTOMY    . SLING BLADE PROCEDURE    . TOTAL ABDOMINAL HYSTERECTOMY      Family History  Problem Relation Age of Onset  . Transient ischemic attack Mother   . Atrial fibrillation Mother   . Arthritis Mother        OA  . Heart disease Father        CHF  . CAD Father   . Hyperlipidemia Sister   .  Arthritis Sister        OA  . Drug abuse Son   . Hypertension Son   . Stroke Sister        Lived in a nursing home and died from COVID-72  . Arthritis Sister        OA  . Heart failure Other   . Coronary artery disease Other   . Stroke Other   . Hyperlipidemia Other   . Hypertension Other   . Thyroid disease Other   . Breast cancer Maternal Aunt   . Breast cancer Paternal Aunt     Social History   Tobacco Use  . Smoking status: Never Smoker  . Smokeless tobacco: Never Used  Substance Use Topics  .  Alcohol use: No    Alcohol/week: 0.0 standard drinks     Current Outpatient Medications:  .  atorvastatin (LIPITOR) 40 MG tablet, Take 1 tablet (40 mg total) by mouth daily., Disp: 90 tablet, Rfl: 1 .  benzonatate (TESSALON) 100 MG capsule, Take 1-2 capsules (100-200 mg total) by mouth 2 (two) times daily as needed., Disp: 40 capsule, Rfl: 0 .  calcium carbonate (TUMS EX) 750 MG chewable tablet, Chew 1 tablet by mouth daily., Disp: , Rfl:  .  Cholecalciferol (VITAMIN D) 2000 units CAPS, Take 1 capsule by mouth daily., Disp: , Rfl:  .  Coenzyme Q10 (COQ10) 100 MG CAPS, Take 1 capsule by mouth daily., Disp: 30 capsule, Rfl: 0 .  denosumab (PROLIA) 60 MG/ML SOSY injection, Inject 60 mg into the skin every 6 (six) months., Disp: , Rfl:  .  levothyroxine (SYNTHROID) 25 MCG tablet, TAKE 1 TABLET BY MOUTH DAILY BEFORE BREAKFAST. ALTERNATING WITH 50 MCG EVERY OTHER DAY, Disp: 135 tablet, Rfl: 1 .  loratadine (CLARITIN) 10 MG tablet, Take 1 tablet by mouth daily., Disp: , Rfl:  .  Magnesium 400 MG TABS, Take 1 tablet by mouth daily., Disp: , Rfl:  .  omeprazole (PRILOSEC) 40 MG capsule, TAKE 1 CAPSULE(40 MG) BY MOUTH DAILY, Disp: 90 capsule, Rfl: 0 .  Polyethylene Glycol 400 0.25 % SOLN, Place 1 drop into both eyes 2 (two) times daily as needed (dry eyes)., Disp: , Rfl:  .  vitamin B-12 (CYANOCOBALAMIN) 1000 MCG tablet, Take 1,000 mcg by mouth 3 (three) times a week., Disp: , Rfl:   Allergies  Allergen Reactions  . Alendronate     Dyspepsia, inflamed esophagus   . Levofloxacin Hives  . Nitrofurantoin Monohyd Macro Hives  . Sulfamethoxazole-Trimethoprim Hives  . Etodolac Hives  . Penicillins Hives and Rash    Has patient had a PCN reaction causing immediate rash, facial/tongue/throat swelling, SOB or lightheadedness with hypotension: No Has patient had a PCN reaction causing severe rash involving mucus membranes or skin necrosis: No Has patient had a PCN reaction that required hospitalization:  No Has patient had a PCN reaction occurring within the last 10 years: No If all of the above answers are "NO", then may proceed with Cephalosporin use.     I personally reviewed active problem list, medication list, allergies, family history, social history, health maintenance, notes from last encounter with the patient/caregiver today.   ROS  Constitutional: Negative for fever or weight change.  Respiratory: positive  for cough but no shortness of breath.   Cardiovascular: Negative for chest pain or palpitations.  Gastrointestinal: Negative for abdominal pain, no bowel changes.  Musculoskeletal: Negative for gait problem or joint swelling.  Skin: Negative for rash.  Neurological: Negative for dizziness  or headache.  No other specific complaints in a complete review of systems (except as listed in HPI above).  Objective  Vitals:   05/05/20 1022  BP: 128/82  Pulse: 78  Resp: 16  Temp: 98.3 F (36.8 C)  TempSrc: Oral  SpO2: 99%  Weight: 109 lb (49.4 kg)  Height: 5\' 2"  (1.575 m)    Body mass index is 19.94 kg/m.  Physical Exam  Constitutional: Patient appears well-developed and well-nourished.  No distress.  HEENT: head atraumatic, normocephalic, pupils equal and reactive to light,neck supple Cardiovascular: Normal rate, regular rhythm and normal heart sounds.  No murmur heard. No BLE edema. Pulmonary/Chest: Effort normal and breath sounds normal. No respiratory distress. Abdominal: Soft.  There is no tenderness. Psychiatric: Patient has a normal mood and affect. behavior is normal. Judgment and thought content normal.  PHQ2/9: Depression screen [facility] 2/9 05/05/2020 11/02/2019 05/11/2019 05/04/2019 10/27/2018  Decreased Interest 0 0 0 0 0  Down, Depressed, Hopeless 0 0 0 0 0  PHQ - 2 Score 0 0 0 0 0  Altered sleeping - 0 - 0 0  Tired, decreased energy - 0 - 0 0  Change in appetite - 0 - 0 0  Feeling bad or failure about yourself  - 0 - 0 0  Trouble concentrating - 0 - 0  0  Moving slowly or fidgety/restless - 0 - 0 0  Suicidal thoughts - 0 - 0 0  PHQ-9 Score - 0 - 0 0  Difficult doing work/chores - - - Not difficult at all -    phq 9 is negative   Fall Risk: Fall Risk  05/05/2020 11/02/2019 05/11/2019 05/04/2019 10/27/2018  Falls in the past year? 0 0 0 0 0  Number falls in past yr: 0 - 0 0 0  Injury with Fall? 0 - 0 0 0  Follow up - - Falls prevention discussed - -     Functional Status Survey: Is the patient deaf or have difficulty hearing?: Yes Does the patient have difficulty seeing, even when wearing glasses/contacts?: No Does the patient have difficulty concentrating, remembering, or making decisions?: No Does the patient have difficulty walking or climbing stairs?: No Does the patient have difficulty dressing or bathing?: No Does the patient have difficulty doing errands alone such as visiting a doctor's office or shopping?: No    Assessment & Plan  1. Atherosclerosis of aorta (HCC)  - atorvastatin (LIPITOR) 40 MG tablet; Take 1 tablet (40 mg total) by mouth daily.  Dispense: 90 tablet; Refill: 1  2. Senile purpura (HCC)  Stable and reassurance given   3. B12 deficiency  Continue supplementation   4. Chronic cough  - omeprazole (PRILOSEC) 40 MG capsule; Take 1 capsule (40 mg total) by mouth daily.  Dispense: 90 capsule; Refill: 1 - fexofenadine (ALLEGRA ALLERGY) 180 MG tablet; Take 1 tablet (180 mg total) by mouth daily.  Dispense: 90 tablet; Refill: 0  5. Acquired hypothyroidism  - TSH  6. Dyslipidemia  - Lipid panel  7. Age-related osteoporosis without current pathological fracture  - DG Bone Density; Future  8. Gastroesophageal reflux disease without esophagitis  - omeprazole (PRILOSEC) 40 MG capsule; Take 1 capsule (40 mg total) by mouth daily.  Dispense: 90 capsule; Refill: 1  9. Chronic constipation   10. Vitamin D deficiency  Continue vitamin D   11. Long-term use of high-risk medication  - COMPLETE  METABOLIC PANEL WITH GFR  12. Gastro-esophageal reflux disease without esophagitis  - omeprazole (PRILOSEC)  40 MG capsule; Take 1 capsule (40 mg total) by mouth daily.  Dispense: 90 capsule; Refill: 1.

## 2020-05-05 ENCOUNTER — Ambulatory Visit (INDEPENDENT_AMBULATORY_CARE_PROVIDER_SITE_OTHER): Payer: Medicare Other | Admitting: Family Medicine

## 2020-05-05 ENCOUNTER — Encounter: Payer: Self-pay | Admitting: Family Medicine

## 2020-05-05 ENCOUNTER — Other Ambulatory Visit: Payer: Self-pay

## 2020-05-05 VITALS — BP 128/82 | HR 78 | Temp 98.3°F | Resp 16 | Ht 62.0 in | Wt 109.0 lb

## 2020-05-05 DIAGNOSIS — M810 Age-related osteoporosis without current pathological fracture: Secondary | ICD-10-CM | POA: Diagnosis not present

## 2020-05-05 DIAGNOSIS — I700 Atherosclerosis of aorta: Secondary | ICD-10-CM | POA: Diagnosis not present

## 2020-05-05 DIAGNOSIS — D692 Other nonthrombocytopenic purpura: Secondary | ICD-10-CM

## 2020-05-05 DIAGNOSIS — E785 Hyperlipidemia, unspecified: Secondary | ICD-10-CM

## 2020-05-05 DIAGNOSIS — R053 Chronic cough: Secondary | ICD-10-CM | POA: Diagnosis not present

## 2020-05-05 DIAGNOSIS — E538 Deficiency of other specified B group vitamins: Secondary | ICD-10-CM

## 2020-05-05 DIAGNOSIS — E039 Hypothyroidism, unspecified: Secondary | ICD-10-CM

## 2020-05-05 DIAGNOSIS — K219 Gastro-esophageal reflux disease without esophagitis: Secondary | ICD-10-CM

## 2020-05-05 DIAGNOSIS — K5909 Other constipation: Secondary | ICD-10-CM | POA: Diagnosis not present

## 2020-05-05 DIAGNOSIS — Z79899 Other long term (current) drug therapy: Secondary | ICD-10-CM | POA: Diagnosis not present

## 2020-05-05 DIAGNOSIS — E559 Vitamin D deficiency, unspecified: Secondary | ICD-10-CM | POA: Diagnosis not present

## 2020-05-05 LAB — COMPLETE METABOLIC PANEL WITH GFR
AG Ratio: 2 (calc) (ref 1.0–2.5)
ALT: 23 U/L (ref 6–29)
AST: 25 U/L (ref 10–35)
Albumin: 4.3 g/dL (ref 3.6–5.1)
Alkaline phosphatase (APISO): 116 U/L (ref 37–153)
BUN: 12 mg/dL (ref 7–25)
CO2: 33 mmol/L — ABNORMAL HIGH (ref 20–32)
Calcium: 9.7 mg/dL (ref 8.6–10.4)
Chloride: 101 mmol/L (ref 98–110)
Creat: 0.71 mg/dL (ref 0.60–0.93)
GFR, Est African American: 96 mL/min/{1.73_m2} (ref >=60)
GFR, Est Non African American: 83 mL/min/{1.73_m2} (ref >=60)
Globulin: 2.2 g/dL (calc) (ref 1.9–3.7)
Glucose, Bld: 81 mg/dL (ref 65–99)
Potassium: 4.6 mmol/L (ref 3.5–5.3)
Sodium: 139 mmol/L (ref 135–146)
Total Bilirubin: 0.5 mg/dL (ref 0.2–1.2)
Total Protein: 6.5 g/dL (ref 6.1–8.1)

## 2020-05-05 LAB — LIPID PANEL
Cholesterol: 164 mg/dL (ref <200)
HDL: 82 mg/dL (ref >=50)
LDL Cholesterol (Calc): 69 mg/dL (calc)
Non-HDL Cholesterol (Calc): 82 mg/dL (calc) (ref <130)
Total CHOL/HDL Ratio: 2 (calc) (ref <5.0)
Triglycerides: 53 mg/dL (ref <150)

## 2020-05-05 LAB — TSH: TSH: 1.55 mIU/L (ref 0.40–4.50)

## 2020-05-05 MED ORDER — OMEPRAZOLE 40 MG PO CPDR: 40.0000 mg | DELAYED_RELEASE_CAPSULE | Freq: Every day | ORAL | 1 refills | Status: DC

## 2020-05-05 MED ORDER — ATORVASTATIN CALCIUM 40 MG PO TABS: 40.0000 mg | ORAL_TABLET | Freq: Every day | ORAL | 1 refills | Status: DC

## 2020-05-05 MED ORDER — FEXOFENADINE HCL 180 MG PO TABS: 180.0000 mg | ORAL_TABLET | Freq: Every day | ORAL | 0 refills | Status: AC

## 2020-05-06 ENCOUNTER — Ambulatory Visit (INDEPENDENT_AMBULATORY_CARE_PROVIDER_SITE_OTHER): Payer: Medicare Other | Admitting: Dermatology

## 2020-05-06 ENCOUNTER — Other Ambulatory Visit: Payer: Self-pay | Admitting: Family Medicine

## 2020-05-06 ENCOUNTER — Encounter: Payer: Self-pay | Admitting: Dermatology

## 2020-05-06 ENCOUNTER — Other Ambulatory Visit: Payer: Self-pay

## 2020-05-06 DIAGNOSIS — L578 Other skin changes due to chronic exposure to nonionizing radiation: Secondary | ICD-10-CM | POA: Diagnosis not present

## 2020-05-06 DIAGNOSIS — Z1283 Encounter for screening for malignant neoplasm of skin: Secondary | ICD-10-CM | POA: Diagnosis not present

## 2020-05-06 DIAGNOSIS — Z85828 Personal history of other malignant neoplasm of skin: Secondary | ICD-10-CM

## 2020-05-06 DIAGNOSIS — D1800 Hemangioma unspecified site: Secondary | ICD-10-CM | POA: Diagnosis not present

## 2020-05-06 DIAGNOSIS — Z86018 Personal history of other benign neoplasm: Secondary | ICD-10-CM | POA: Diagnosis not present

## 2020-05-06 DIAGNOSIS — L820 Inflamed seborrheic keratosis: Secondary | ICD-10-CM | POA: Diagnosis not present

## 2020-05-06 DIAGNOSIS — L570 Actinic keratosis: Secondary | ICD-10-CM | POA: Diagnosis not present

## 2020-05-06 DIAGNOSIS — L300 Nummular dermatitis: Secondary | ICD-10-CM

## 2020-05-06 DIAGNOSIS — D229 Melanocytic nevi, unspecified: Secondary | ICD-10-CM

## 2020-05-06 DIAGNOSIS — L814 Other melanin hyperpigmentation: Secondary | ICD-10-CM | POA: Diagnosis not present

## 2020-05-06 DIAGNOSIS — L821 Other seborrheic keratosis: Secondary | ICD-10-CM

## 2020-05-06 NOTE — Patient Instructions (Signed)
Cryotherapy Aftercare  . Wash gently with soap and water everyday.   Marland Kitchen Apply Vaseline and Band-Aid daily until healed.    Gentle Skin Care Guide  1. Bathe no more than once a day.  2. Avoid bathing in hot water  3. Use a mild soap like Dove, Vanicream, Cetaphil, CeraVe. Can use Lever 2000 or Cetaphil antibacterial soap  4. Use soap only where you need it. On most days, use it under your arms, between your legs, and on your feet. Let the water rinse other areas unless visibly dirty.  5. When you get out of the bath/shower, use a towel to gently blot your skin dry, don't rub it.  6. While your skin is still a little damp, apply a moisturizing cream such as Vanicream, CeraVe, Cetaphil, Eucerin, Sarna lotion or plain Vaseline Jelly. For hands apply Neutrogena [place] Hand Cream or Excipial Hand Cream.  7. Reapply moisturizer any time you start to itch or feel dry.  8. Sometimes using free and clear laundry detergents can be helpful. Fabric softener sheets should be avoided. Downy Free & Gentle liquid, or any liquid fabric softener that is free of dyes and perfumes, it acceptable to use  9. If your doctor has given you prescription creams you may apply moisturizers over them

## 2020-05-06 NOTE — Progress Notes (Signed)
Follow-Up Visit   Subjective  Heather Singh is a 75 y.o. female who presents for the following: Annual Exam (Total body exam today. Hx of BCC ~22 years ago. Hx of dysplastic nevus ~12 years ago. Pt has a few spots to check that have been itchy and bothersome. ).  Patient here for full body skin exam and skin cancer screening.   The following portions of the chart were reviewed this encounter and updated as appropriate:      Review of Systems: No other skin or systemic complaints except as noted in HPI or Assessment and Plan.   Objective  Well appearing patient in no apparent distress; mood and affect are within normal limits.  A full examination was performed including scalp, head, eyes, ears, nose, lips, neck, chest, axillae, abdomen, back, buttocks, bilateral upper extremities, bilateral lower extremities, hands, feet, fingers, toes, fingernails, and toenails. All findings within normal limits unless otherwise noted below.  Objective  right lateral calf x 1, left spinal mid back x 1, right abdomen x 1, lower sternum x 1 (4): Erythematous keratotic or waxy stuck-on papule    Objective  back, chest, upper legs: Clear today.  Objective  nasal dorsum x 1: Erythematous thin papules/macules with gritty scale.   Assessment & Plan  Inflamed seborrheic keratosis (4) right lateral calf x 1, left spinal mid back x 1, right abdomen x 1, lower sternum x 1  Prior to procedure, discussed risks of blister formation, small wound, skin dyspigmentation, or rare scar following cryotherapy.    Destruction of lesion - right lateral calf x 1, left spinal mid back x 1, right abdomen x 1, lower sternum x 1  Destruction method: cryotherapy   Informed consent: discussed and consent obtained   Lesion destroyed using liquid nitrogen: Yes   Region frozen until ice ball extended beyond lesion: Yes   Outcome: patient tolerated procedure well with no complications   Post-procedure details: wound  care instructions given    Nummular dermatitis back, chest, upper legs  Clear today Continue TMC 0.1% cream 1-2 times daily PRN flares. Pt has.   Recommend mild soap and moisturizing cream 1-2 times daily.   Topical steroids (such as triamcinolone, fluocinolone, fluocinonide, mometasone, clobetasol, halobetasol, betamethasone, hydrocortisone) can cause thinning and lightening of the skin if they are used for too long in the same area. Your physician has selected the right strength medicine for your problem and area affected on the body. Please use your medication only as directed by your physician to prevent side effects.     AK (actinic keratosis) nasal dorsum x 1  Prior to procedure, discussed risks of blister formation, small wound, skin dyspigmentation, or rare scar following cryotherapy.    Destruction of lesion - nasal dorsum x 1  Destruction method: cryotherapy   Informed consent: discussed and consent obtained   Lesion destroyed using liquid nitrogen: Yes   Region frozen until ice ball extended beyond lesion: Yes   Outcome: patient tolerated procedure well with no complications   Post-procedure details: wound care instructions given     Lentigines - Scattered tan macules - Discussed due to sun exposure - Benign, observe - Call for any changes  Seborrheic Keratoses - Stuck-on, waxy, tan-brown papules and plaques  - Discussed benign etiology and prognosis. - Observe - Call for any changes  Melanocytic Nevi - Tan-brown and/or pink-flesh-colored symmetric macules and papules - Benign appearing on exam today - Observation - Call clinic for new or changing moles -  Recommend daily use of broad spectrum spf 30+ sunscreen to sun-exposed areas.   Hemangiomas - Red papules - Discussed benign nature - Observe - Call for any changes  Actinic Damage - Chronic, secondary to cumulative UV/sun exposure - diffuse scaly erythematous macules with underlying  dyspigmentation - Recommend daily broad spectrum sunscreen SPF 30+ to sun-exposed areas, reapply every 2 hours as needed.  - Call for new or changing lesions.  Skin cancer screening performed today.  History of Basal Cell Carcinoma of the Skin L brow, ~22 years ago, tx w/ MOHs - No evidence of recurrence today - Recommend regular full body skin exams - Recommend daily broad spectrum sunscreen SPF 30+ to sun-exposed areas, reapply every 2 hours as needed.  - Call if any new or changing lesions are noted between office visits  History of Dysplastic Nevi Right thigh, mild, 01/17/2009 - No evidence of recurrence today - Recommend regular full body skin exams - Recommend daily broad spectrum sunscreen SPF 30+ to sun-exposed areas, reapply every 2 hours as needed.  - Call if any new or changing lesions are noted between office visits   Return in about 1 year (around 05/06/2021) for TSBE.   I, Harriett Sine, CMA, am acting as scribe for Brendolyn Patty, MD.  Documentation: I have reviewed the above documentation for accuracy and completeness, and I agree with the above.  Brendolyn Patty MD

## 2020-05-15 ENCOUNTER — Other Ambulatory Visit: Payer: Self-pay

## 2020-05-15 ENCOUNTER — Ambulatory Visit (INDEPENDENT_AMBULATORY_CARE_PROVIDER_SITE_OTHER): Payer: Medicare Other

## 2020-05-15 VITALS — BP 124/70 | HR 73 | Temp 98.2°F | Resp 16 | Ht 62.0 in | Wt 109.8 lb

## 2020-05-15 DIAGNOSIS — Z0000 Encounter for general adult medical examination without abnormal findings: Secondary | ICD-10-CM

## 2020-05-15 NOTE — Patient Instructions (Signed)
Heather Singh , Thank you for taking time to come for your Medicare Wellness Visit. I appreciate your ongoing commitment to your health goals. Please review the following plan we discussed and let me know if I can assist you in the future.   Screening recommendations/referrals: Colonoscopy: done 05/30/18. Repeat in 2025 Mammogram: done 02/13/20 Bone Density: done 11/30/18. Due 09/22 Recommended yearly ophthalmology/optometry visit for glaucoma screening and checkup Recommended yearly dental visit for hygiene and checkup  Vaccinations: Influenza vaccine: done 11/23/19 Pneumococcal vaccine: done 10/21/14 Tdap vaccine: done 03/30/16 Shingles vaccine: done 10/21/17 & 01/02/18   Covid-19:done 03/30/19, 04/20/19 & 12/13/19  Advanced directives: Please bring a copy of your health care power of attorney and living will to the office at your convenience.  Conditions/risks identified: Keep up the great work!  Next appointment: Follow up in one year for your annual wellness visit    Preventive Care 65 Years and Older, Female Preventive care refers to lifestyle choices and visits with your health care provider that can promote health and wellness. What does preventive care include?  A yearly physical exam. This is also called an annual well check.  Dental exams once or twice a year.  Routine eye exams. Ask your health care provider how often you should have your eyes checked.  Personal lifestyle choices, including:  Daily care of your teeth and gums.  Regular physical activity.  Eating a healthy diet.  Avoiding tobacco and drug use.  Limiting alcohol use.  Practicing safe sex.  Taking low-dose aspirin every day.  Taking vitamin and mineral supplements as recommended by your health care provider. What happens during an annual well check? The services and screenings done by your health care provider during your annual well check will depend on your age, overall health, lifestyle risk factors,  and family history of disease. Counseling  Your health care provider may ask you questions about your:  Alcohol use.  Tobacco use.  Drug use.  Emotional well-being.  Home and relationship well-being.  Sexual activity.  Eating habits.  History of falls.  Memory and ability to understand (cognition).  Work and work Statistician.  Reproductive health. Screening  You may have the following tests or measurements:  Height, weight, and BMI.  Blood pressure.  Lipid and cholesterol levels. These may be checked every 5 years, or more frequently if you are over 53 years old.  Skin check.  Lung cancer screening. You may have this screening every year starting at age 32 if you have a 30-pack-year history of smoking and currently smoke or have quit within the past 15 years.  Fecal occult blood test (FOBT) of the stool. You may have this test every year starting at age 41.  Flexible sigmoidoscopy or colonoscopy. You may have a sigmoidoscopy every 5 years or a colonoscopy every 10 years starting at age 75.  Hepatitis C blood test.  Hepatitis B blood test.  Sexually transmitted disease (STD) testing.  Diabetes screening. This is done by checking your blood sugar (glucose) after you have not eaten for a while (fasting). You may have this done every 1-3 years.  Bone density scan. This is done to screen for osteoporosis. You may have this done starting at age 86.  Mammogram. This may be done every 1-2 years. Talk to your health care provider about how often you should have regular mammograms. Talk with your health care provider about your test results, treatment options, and if necessary, the need for more tests. Vaccines  Your  health care provider may recommend certain vaccines, such as:  Influenza vaccine. This is recommended every year.  Tetanus, diphtheria, and acellular pertussis (Tdap, Td) vaccine. You may need a Td booster every 10 years.  Zoster vaccine. You may need  this after age 59.  Pneumococcal 13-valent conjugate (PCV13) vaccine. One dose is recommended after age 36.  Pneumococcal polysaccharide (PPSV23) vaccine. One dose is recommended after age 43. Talk to your health care provider about which screenings and vaccines you need and how often you need them. This information is not intended to replace advice given to you by your health care provider. Make sure you discuss any questions you have with your health care provider. Document Released: 03/28/2015 Document Revised: 11/19/2015 Document Reviewed: 12/31/2014 Elsevier Interactive Patient Education  2017 [place] Prevention in the Home Falls can cause injuries. They can happen to people of all ages. There are many things you can do to make your home safe and to help prevent falls. What can I do on the outside of my home?  Regularly fix the edges of walkways and driveways and fix any cracks.  Remove anything that might make you trip as you walk through a door, such as a raised step or threshold.  Trim any bushes or trees on the path to your home.  Use bright outdoor lighting.  Clear any walking paths of anything that might make someone trip, such as rocks or tools.  Regularly check to see if handrails are loose or broken. Make sure that both sides of any steps have handrails.  Any raised decks and porches should have guardrails on the edges.  Have any leaves, snow, or ice cleared regularly.  Use sand or salt on walking paths during winter.  Clean up any spills in your garage right away. This includes oil or grease spills. What can I do in the bathroom?  Use night lights.  Install grab bars by the toilet and in the tub and shower. Do not use towel bars as grab bars.  Use non-skid mats or decals in the tub or shower.  If you need to sit down in the shower, use a plastic, non-slip stool.  Keep the floor dry. Clean up any water that spills on the floor as soon as it  happens.  Remove soap buildup in the tub or shower regularly.  Attach bath mats securely with double-sided non-slip rug tape.  Do not have throw rugs and other things on the floor that can make you trip. What can I do in the bedroom?  Use night lights.  Make sure that you have a light by your bed that is easy to reach.  Do not use any sheets or blankets that are too big for your bed. They should not hang down onto the floor.  Have a firm chair that has side arms. You can use this for support while you get dressed.  Do not have throw rugs and other things on the floor that can make you trip. What can I do in the kitchen?  Clean up any spills right away.  Avoid walking on wet floors.  Keep items that you use a lot in easy-to-reach places.  If you need to reach something above you, use a strong step stool that has a grab bar.  Keep electrical cords out of the way.  Do not use floor polish or wax that makes floors slippery. If you must use wax, use non-skid floor wax.  Do not  have throw rugs and other things on the floor that can make you trip. What can I do with my stairs?  Do not leave any items on the stairs.  Make sure that there are handrails on both sides of the stairs and use them. Fix handrails that are broken or loose. Make sure that handrails are as long as the stairways.  Check any carpeting to make sure that it is firmly attached to the stairs. Fix any carpet that is loose or worn.  Avoid having throw rugs at the top or bottom of the stairs. If you do have throw rugs, attach them to the floor with carpet tape.  Make sure that you have a light switch at the top of the stairs and the bottom of the stairs. If you do not have them, ask someone to add them for you. What else can I do to help prevent falls?  Wear shoes that:  Do not have high heels.  Have rubber bottoms.  Are comfortable and fit you well.  Are closed at the toe. Do not wear sandals.  If you  use a stepladder:  Make sure that it is fully opened. Do not climb a closed stepladder.  Make sure that both sides of the stepladder are locked into place.  Ask someone to hold it for you, if possible.  Clearly mark and make sure that you can see:  Any grab bars or handrails.  First and last steps.  Where the edge of each step is.  Use tools that help you move around (mobility aids) if they are needed. These include:  Canes.  Walkers.  Scooters.  Crutches.  Turn on the lights when you go into a dark area. Replace any light bulbs as soon as they burn out.  Set up your furniture so you have a clear path. Avoid moving your furniture around.  If any of your floors are uneven, fix them.  If there are any pets around you, be aware of where they are.  Review your medicines with your doctor. Some medicines can make you feel dizzy. This can increase your chance of falling. Ask your doctor what other things that you can do to help prevent falls. This information is not intended to replace advice given to you by your health care provider. Make sure you discuss any questions you have with your health care provider. Document Released: 12/26/2008 Document Revised: 08/07/2015 Document Reviewed: 04/05/2014 Elsevier Interactive Patient Education  2017 [employer].

## 2020-05-15 NOTE — Progress Notes (Signed)
Subjective:   Heather Singh is a 75 y.o. female who presents for Medicare Annual (Subsequent) preventive examination.  Review of Systems     Cardiac Risk Factors include: advanced age (>90men, >41 women);dyslipidemia     Objective:    Today's Vitals   05/15/20 1006  BP: 124/70  Pulse: 73  Resp: 16  Temp: 98.2 F (36.8 C)  TempSrc: Oral  SpO2: 99%  Weight: 109 lb 12.8 oz (49.8 kg)  Height: $Remove'5\' 2"'wxFGGCH$  (1.575 m)   Body mass index is 20.08 kg/m.  Advanced Directives 05/15/2020 05/11/2019 02/24/2019 04/28/2018 10/11/2016 09/21/2016 09/11/2016  Does Patient Have a Medical Advance Directive? Yes Yes No;Yes Yes Yes Yes Yes  Type of Paramedic of [place];Living will [place];Living will [place];Living will [place];Living will [place];Living will [place];Living will -  Does patient want to make changes to medical advance directive? - - - - - - -  Copy of [place] in Chart? Yes - validated most recent copy scanned in chart (See row information) No - copy requested - No - copy requested No - copy requested No - copy requested -    Current Medications (verified) Outpatient Encounter Medications as of 05/15/2020  Medication Sig  . atorvastatin (LIPITOR) 40 MG tablet Take 1 tablet (40 mg total) by mouth daily.  . calcium carbonate (TUMS EX) 750 MG chewable tablet Chew 1 tablet by mouth daily.  . Cholecalciferol (VITAMIN D) 2000 units CAPS Take 1 capsule by mouth daily.  Marland Kitchen denosumab (PROLIA) 60 MG/ML SOSY injection Inject 60 mg into the skin every 6 (six) months.  . fexofenadine (ALLEGRA ALLERGY) 180 MG tablet Take 1 tablet (180 mg total) by mouth daily.  Marland Kitchen levothyroxine (SYNTHROID) 25 MCG tablet TAKE 1 TABLET BY MOUTH DAILY BEFORE BREAKFAST. ALTERNATING WITH 50 MCG EVERY OTHER DAY  . Magnesium 400 MG TABS Take 1 tablet by mouth daily.  Marland Kitchen omeprazole  (PRILOSEC) 40 MG capsule Take 1 capsule (40 mg total) by mouth daily.  . Polyethylene Glycol 400 0.25 % SOLN Place 1 drop into both eyes 2 (two) times daily as needed (dry eyes).  . vitamin B-12 (CYANOCOBALAMIN) 1000 MCG tablet Take 1,000 mcg by mouth 3 (three) times a week.   No facility-administered encounter medications on file as of 05/15/2020.    Allergies (verified) Alendronate, Levofloxacin, Nitrofurantoin monohyd macro, Sulfamethoxazole-trimethoprim, Etodolac, and Penicillins   History: Past Medical History:  Diagnosis Date  . Allergy   . Back pain   . Basal cell carcinoma ~2000   L brow, treated with MOHs  . Dyslipidemia   . Dysplastic nevus 01/17/2009   right thigh, slight atypia   . Fibrocystic breast disease   . GERD (gastroesophageal reflux disease)   . Hyperlipidemia   . Menopause   . Osteoporosis   . Reflux esophagitis   . RLS (restless legs syndrome)   . Urinary dysfunction    Past Surgical History:  Procedure Laterality Date  . BREAST CYST ASPIRATION Left   . BREAST EXCISIONAL BIOPSY Left 1980's   neg  . CATARACT EXTRACTION    . EYE SURGERY     left  . KYPHOPLASTY N/A 08/19/2016   Procedure: KYPHOPLASTY L3,L4;  Surgeon: Hessie Knows, MD;  Location: [facility] ORS;  Service: Orthopedics;  Laterality: N/A;  . KYPHOPLASTY N/A 09/21/2016   Procedure: [id number]-L4;  Surgeon: Hessie Knows, MD;  Location: [facility] ORS;  Service: Orthopedics;  Laterality: N/A;  . MOUTH SURGERY    . POLYPECTOMY    . SLING BLADE PROCEDURE    . TOTAL ABDOMINAL HYSTERECTOMY     Family History  Problem Relation Age of Onset  . Transient ischemic attack Mother   . Atrial fibrillation Mother   . Arthritis Mother        OA  . Heart disease Father        CHF  . CAD Father   . Hyperlipidemia Sister   . Arthritis Sister        OA  . Drug abuse Son   . Hypertension Son   . Stroke Sister        Lived in a nursing home and died from COVID-24  . Arthritis Sister        OA  . Heart  failure Other   . Coronary artery disease Other   . Stroke Other   . Hyperlipidemia Other   . Hypertension Other   . Thyroid disease Other   . Breast cancer Maternal Aunt   . Breast cancer Paternal Aunt    Social History   Socioeconomic History  . Marital status: Married    Spouse name: Not on file  . Number of children: 1  . Years of education: Not on file  . Highest education level: High school graduate  Occupational History  . Not on file  Tobacco Use  . Smoking status: Never Smoker  . Smokeless tobacco: Never Used  Vaping Use  . Vaping Use: Never used  Substance and Sexual Activity  . Alcohol use: No    Alcohol/week: 0.0 standard drinks  . Drug use: No  . Sexual activity: Yes    Partners: Male  Other Topics Concern  . Not on file  Social History Narrative   Married   Gets regular exercise   Social Determinants of Health   Financial Resource Strain: Low Risk   . Difficulty of Paying Living Expenses: Not hard at all  Food Insecurity: No Food Insecurity  . Worried About Charity fundraiser in the Last Year: Never true  . Ran Out of Food in the Last Year: Never true  Transportation Needs: No Transportation Needs  . Lack of Transportation (Medical): No  . Lack of Transportation (Non-Medical): No  Physical Activity: Sufficiently Active  . Days of Exercise per Week: 7 days  . Minutes of Exercise per Session: 30 min  Stress: No Stress Concern Present  . Feeling of Stress : Not at all  Social Connections: Moderately Integrated  . Frequency of Communication with Friends and Family: More than three times a week  . Frequency of Social Gatherings with Friends and Family: Three times a week  . Attends Religious Services: More than 4 times per year  . Active Member of Clubs or Organizations: No  . Attends Archivist Meetings: Never  . Marital Status: Married    Tobacco Counseling Counseling given: Not Answered   Clinical Intake:  Pre-visit preparation  completed: Yes  Pain : No/denies pain     BMI - recorded: 20.08 Nutritional Status: BMI of 19-24  Normal Nutritional Risks: None Diabetes: No  How often do you need to have someone help you when you read instructions, pamphlets, or other written materials from your doctor or pharmacy?: 1 - Never    Interpreter Needed?: No  Information entered by :: Clemetine Marker LPN   Activities of Daily Living In your present state of health, do you have  any difficulty performing the following activities: 05/15/2020 05/05/2020  Hearing? Tempie Donning  Comment wears hearing aids -  Vision? N N  Difficulty concentrating or making decisions? N N  Walking or climbing stairs? N N  Dressing or bathing? N N  Doing errands, shopping? N N  Preparing Food and eating ? N -  Using the Toilet? N -  In the past six months, have you accidently leaked urine? Y -  Comment wears pads for protection -  Do you have problems with loss of bowel control? N -  Managing your Medications? N -  Managing your Finances? N -  Housekeeping or managing your Housekeeping? N -  Some recent data might be hidden    Patient Care Team: Steele Sizer, MD as PCP - General (Family Medicine) Beverly Gust, MD as Consulting Physician (Otolaryngology) Gabriel Carina Betsey Holiday, MD as Consulting Physician (Endocrinology) Brendolyn Patty, MD (Dermatology) Sharlotte Alamo, DPM (Podiatry)  Indicate any recent Medical Services you may have received from other than [facility] providers in the past year (date may be approximate).     Assessment:   This is a routine wellness examination for Dalayah.  Hearing/Vision screen  Hearing Screening   '125Hz'$  $Remo'250Hz'RaxxE$'500Hz'$'1000Hz'$'2000Hz'$'3000Hz'$'4000Hz'$'6000Hz'$'8000Hz'$   Right ear:           Left ear:           Vision Screening Comments: Annual vision screenings with Dr. Thomasene Ripple  Dietary issues and exercise activities discussed: Current Exercise Habits: Home exercise routine, Type of exercise: Other - see comments;walking  (tai chi), Time (Minutes): 30, Frequency (Times/Week): 7, Weekly Exercise (Minutes/Week): 210, Intensity: Moderate, Exercise limited by: None identified  Goals    . DIET - INCREASE WATER INTAKE     Recommend drinking 6-8 glasses of water per day      Depression Screen PHQ 2/9 Scores 05/15/2020 05/05/2020 11/02/2019 05/11/2019 05/04/2019 10/27/2018 07/31/2018  PHQ - 2 Score 0 0 0 0 0 0 0  PHQ- 9 Score - - 0 - 0 0 0    Fall Risk Fall Risk  05/15/2020 05/05/2020 11/02/2019 05/11/2019 05/04/2019  Falls in the past year? 0 0 0 0 0  Number falls in past yr: 0 0 - 0 0  Injury with Fall? 0 0 - 0 0  Risk for fall due to : No Fall Risks - - - -  Follow up Falls prevention discussed - - Falls prevention discussed -    FALL RISK PREVENTION PERTAINING TO THE HOME:  Any stairs in or around the home? Yes  If so, are there any without handrails? No  Home free of loose throw rugs in walkways, pet beds, electrical cords, etc? Yes  Adequate lighting in your home to reduce risk of falls? Yes   ASSISTIVE DEVICES UTILIZED TO PREVENT FALLS:  Life alert? No  Use of a cane, walker or w/c? No  Grab bars in the bathroom? No  Shower chair or bench in shower? No  Elevated toilet seat or a handicapped toilet? No   TIMED UP AND GO:  Was the test performed? Yes .  Length of time to ambulate 10 feet: 4 sec.   Gait steady and fast without use of assistive device  Cognitive Function: Normal cognitive status assessed by direct observation by this Nurse Health Advisor. No abnormalities found.       6CIT Screen 05/11/2019 04/28/2018  What Year? 0 points 0 points  What month? 0 points 0 points  What  time? 0 points 0 points  Count back from 20 0 points 0 points  Months in reverse 0 points 0 points  Repeat phrase 0 points 0 points  Total Score 0 0    Immunizations Immunization History  Administered Date(s) Administered  . Fluad Quad(high Dose 65+) 12/19/2018, 11/23/2019  . Influenza Split 02/25/2009, 11/27/2012   . Influenza, High Dose Seasonal PF 11/13/2014, 11/17/2016  . Influenza, Seasonal, Injecte, Preservative Fre 12/01/2009, 12/30/2010, 11/19/2011, 12/20/2013  . Influenza,inj,Quad PF,6+ Mos 01/07/2016  . Influenza-Unspecified 02/02/2018  . PFIZER(Purple Top)SARS-COV-2 Vaccination 03/30/2019, 04/20/2019, 12/13/2019  . Pneumococcal Conjugate-13 10/21/2014  . Pneumococcal Polysaccharide-23 07/29/2008  . Tdap 07/26/2007, 03/30/2016  . Zoster 02/25/2009  . Zoster Recombinat (Shingrix) 10/21/2017, 01/02/2018    TDAP status: Up to date  Flu Vaccine status: Up to date  Pneumococcal vaccine status: Up to date  Covid-19 vaccine status: Completed vaccines  Qualifies for Shingles Vaccine? Yes   Zostavax completed Yes   Shingrix Completed?: Yes  Screening Tests Health Maintenance  Topic Date Due  . COVID-19 Vaccine (4 - Booster for Pfizer series) 06/11/2020  . COLONOSCOPY (Pts 45-48yrs Insurance coverage will need to be confirmed)  05/30/2023  . TETANUS/TDAP  03/30/2026  . INFLUENZA VACCINE  Completed  . DEXA SCAN  Completed  . Hepatitis C Screening  Completed  . PNA vac Low Risk Adult  Completed  . HPV VACCINES  Aged Out    Health Maintenance  There are no preventive care reminders to display for this patient.  Colorectal cancer screening: Type of screening: Colonoscopy. Completed 05/30/18. Repeat every 5 years  Mammogram status: Completed 02/13/20. Repeat every year  Bone Density status: Completed 11/30/18. Results reflect: Bone density results: OSTEOPOROSIS. Repeat every 2 years.  Lung Cancer Screening: (Low Dose CT Chest recommended if Age 64-80 years, 30 pack-year currently smoking OR have quit w/in 15years.) does not qualify.   Additional Screening:  Hepatitis C Screening: does qualify; Completed 12/28/11  Vision Screening: Recommended annual ophthalmology exams for early detection of glaucoma and other disorders of the eye. Is the patient up to date with their annual eye  exam?  Yes  Who is the provider or what is the name of the office in which the patient attends annual eye exams? [facility]  Dental Screening: Recommended annual dental exams for proper oral hygiene  Community Resource Referral / Chronic Care Management: CRR required this visit?  No   CCM required this visit?  No      Plan:     I have personally reviewed and noted the following in the patient's chart:   . Medical and social history . Use of alcohol, tobacco or illicit drugs  . Current medications and supplements . Functional ability and status . Nutritional status . Physical activity . Advanced directives . List of other physicians . Hospitalizations, surgeries, and ER visits in previous 12 months . Vitals . Screenings to include cognitive, depression, and falls . Referrals and appointments  In addition, I have reviewed and discussed with patient certain preventive protocols, quality metrics, and best practice recommendations. A written personalized care plan for preventive services as well as general preventive health recommendations were provided to patient.     Clemetine Marker, LPN   10/16/4194   Nurse Notes: none

## 2020-05-22 ENCOUNTER — Other Ambulatory Visit: Payer: Self-pay | Admitting: Family Medicine

## 2020-05-22 DIAGNOSIS — E039 Hypothyroidism, unspecified: Secondary | ICD-10-CM

## 2020-05-22 NOTE — Telephone Encounter (Signed)
Requested Prescriptions  Pending Prescriptions Disp Refills  . levothyroxine (SYNTHROID) 25 MCG tablet [Pharmacy Med Name: LEVOTHYROXINE 0.025MG  (77SFS) TAB] 135 tablet 1    Sig: TAKE 1 TABLET BY MOUTH DAILY BEFORE BREAKFAST. ALTERNATING WITH 16 MCG EVERY OTHER DAY     Endocrinology:  Hypothyroid Agents Failed - 05/22/2020  3:35 AM      Failed - TSH needs to be rechecked within 3 months after an abnormal result. Refill until TSH is due.      Passed - TSH in normal range and within 360 days    TSH  Date Value Ref Range Status  05/05/2020 1.55 0.40 - 4.50 mIU/L Final         Passed - Valid encounter within last 12 months    Recent Outpatient Visits          2 weeks ago Atherosclerosis of aorta [facility])   [facility] Steele Sizer, MD   6 months ago Acquired hypothyroidism   [facility] Steele Sizer, MD   1 year ago Senile purpura [facility])   [facility] Steele Sizer, MD   1 year ago Senile purpura [facility])   [facility] Steele Sizer, MD   1 year ago [facility] Steele Sizer, MD      Future Appointments            In 5 months Ancil Boozer, Drue Stager, MD [facility], [place]   In 12 months  [facility], [facility]

## 2020-05-26 DIAGNOSIS — H40003 Preglaucoma, unspecified, bilateral: Secondary | ICD-10-CM | POA: Diagnosis not present

## 2020-07-14 ENCOUNTER — Other Ambulatory Visit: Payer: Self-pay | Admitting: Family Medicine

## 2020-07-14 DIAGNOSIS — K219 Gastro-esophageal reflux disease without esophagitis: Secondary | ICD-10-CM

## 2020-07-14 DIAGNOSIS — R053 Chronic cough: Secondary | ICD-10-CM

## 2020-07-21 DIAGNOSIS — M810 Age-related osteoporosis without current pathological fracture: Secondary | ICD-10-CM | POA: Diagnosis not present

## 2020-08-20 ENCOUNTER — Other Ambulatory Visit: Payer: Self-pay

## 2020-08-20 ENCOUNTER — Ambulatory Visit (INDEPENDENT_AMBULATORY_CARE_PROVIDER_SITE_OTHER): Payer: Medicare Other | Admitting: Dermatology

## 2020-08-20 DIAGNOSIS — L905 Scar conditions and fibrosis of skin: Secondary | ICD-10-CM

## 2020-08-20 DIAGNOSIS — L820 Inflamed seborrheic keratosis: Secondary | ICD-10-CM

## 2020-08-20 NOTE — Patient Instructions (Addendum)

## 2020-08-20 NOTE — Progress Notes (Signed)
   Follow-Up Visit   Subjective  Heather Singh is a 75 y.o. female who presents for the following: Spots (Patient has a couple of spots on her right forearm to check today. One area was puffy and pink, but has improved now. She also has an itchy spot on her arm. ) and ISK (Right lateral calf treated with LN2 on 05/06/20 visit. Patient would like area checked to see if it is healing ok.).   The following portions of the chart were reviewed this encounter and updated as appropriate:        Review of Systems:  No other skin or systemic complaints except as noted in HPI or Assessment and Plan.  Objective  Well appearing patient in no apparent distress; mood and affect are within normal limits.  A focused examination was performed including arms, legs. Relevant physical exam findings are noted in the Assessment and Plan.  Right Forearm x 1 Erythematous keratotic or waxy stuck-on papule  Right Lateral Calf Hyperpigmented macule.   Assessment & Plan  Inflamed seborrheic keratosis Right Forearm x 1  Prior to procedure, discussed risks of blister formation, small wound, skin dyspigmentation, or rare scar following cryotherapy.    Destruction of lesion - Right Forearm x 1  Destruction method: cryotherapy   Informed consent: discussed and consent obtained   Lesion destroyed using liquid nitrogen: Yes   Region frozen until ice ball extended beyond lesion: Yes   Outcome: patient tolerated procedure well with no complications   Post-procedure details: wound care instructions given    Scar Right Lateral Calf  Superficial scar c/w post-inflammatory hyperpigmentation (s/p cryotherapy treatment for ISK) ISK is clear  May gradually lighten over time. Recommend sunscreen to aa when outdoors  Return as scheduled.  IJamesetta Orleans, CMA, am acting as scribe for Brendolyn Patty, MD .  Documentation: I have reviewed the above documentation for accuracy and completeness, and I agree with  the above.  Brendolyn Patty MD

## 2020-10-06 ENCOUNTER — Ambulatory Visit: Payer: Self-pay

## 2020-10-06 ENCOUNTER — Telehealth: Payer: Self-pay

## 2020-10-06 ENCOUNTER — Telehealth: Payer: Self-pay | Admitting: *Deleted

## 2020-10-06 DIAGNOSIS — E785 Hyperlipidemia, unspecified: Secondary | ICD-10-CM

## 2020-10-06 NOTE — Telephone Encounter (Signed)
Copied from [place] [phone]. Topic: Appointment Scheduling - Scheduling Inquiry for Clinic >> Oct 06, 2020  3:08 PM Celene Kras wrote: Reason for CRM: Pt calling in stating that she is not able to make it to the appt scheduled on 10/16/20 with Care manager. She is requesting to have a call back to reschedule. Please advise.

## 2020-10-06 NOTE — Chronic Care Management (AMB) (Signed)
  Chronic Care Management   Note  10/06/2020 Name: Heather Singh MRN: [id number] DOB: 09-23-1945  Heather Singh is a 75 y.o. year old female who is a primary care patient of Steele Sizer, MD. I reached out to Cristy Friedlander by phone today in response to a referral sent by Heather Singh's PCP Steele Sizer, MD     Ms. Tarpley was given information about Chronic Care Management services today including:  CCM service includes personalized support from designated clinical staff supervised by her physician, including individualized plan of care and coordination with other care providers 24/7 contact phone numbers for assistance for urgent and routine care needs. Service will only be billed when office clinical staff spend 20 minutes or more in a month to coordinate care. Only one practitioner may furnish and bill the service in a calendar month. The patient may stop CCM services at any time (effective at the end of the month) by phone call to the office staff. The patient will be responsible for cost sharing (co-pay) of up to 20% of the service fee (after annual deductible is met).  Patient agreed to services and verbal consent obtained.   Follow up plan: Telephone appointment with care management team member scheduled for: 10/16/2020  Julian Hy, [place], [place] Management  Direct Dial: [phone]

## 2020-10-13 NOTE — Chronic Care Management (AMB) (Signed)
     Heather Singh September 04, 1945 [id number]    [name] contacted the clinic to reschedule her Chronic Care Management (CCM) appointment. She anticipates being available for telephonic outreach on 10/23/2020. Advised to call if she requires urgent assistance prior to the rescheduled outreach.  PLAN Will follow up on 10/23/2020.    Cristy Friedlander [facility] Care Management [facility] [phone]

## 2020-10-16 ENCOUNTER — Telehealth: Payer: Medicare Other

## 2020-10-23 ENCOUNTER — Ambulatory Visit (INDEPENDENT_AMBULATORY_CARE_PROVIDER_SITE_OTHER): Payer: Medicare Other

## 2020-10-23 DIAGNOSIS — E782 Mixed hyperlipidemia: Secondary | ICD-10-CM

## 2020-10-23 NOTE — Patient Instructions (Addendum)
Thank you for allowing the Chronic Care Management team to participate in your care.    Patient Care Plan: Hyperlipidemia     Problem Identified: Hyperlipidemia      Long-Range Goal: Hyperlipidemia Monitored   Start Date: 10/23/2020  Expected End Date: 01/21/2021  Priority: Medium  Note:   Objective:   Lab Results  Component Value Date   CHOL 164 05/05/2020   HDL 82 05/05/2020   LDLCALC 69 05/05/2020   TRIG 53 05/05/2020   CHOLHDL 2.0 05/05/2020     BP Readings from Last 3 Encounters:  05/15/20 124/70  05/05/20 128/82  11/02/19 110/80   Current Barriers:  Chronic Disease Management support related to Hyperlipidemia self management.  Case Manager Clinical Goal(s):  Over the next 90 days, patient will demonstrate adherence to treatment plan as evidenced by taking medications as prescribed, adhering to a heart healthy diet and engaging in activity as tolerated.  Interventions:  Collaboration with Steele Sizer, MD regarding development and update of comprehensive plan of care as evidenced by provider attestation and co-signature Inter-disciplinary care team collaboration (see longitudinal plan of care) Evaluation of current treatment plan related to hyperlipidemia self management as established by provider. Reviewed medications and discussed importance of compliance. Encouraged to continue taking medications as prescribed. Advised to notify provider if unable to tolerate prescribed regimen.  Discussed nutritional intake and activity level. Advised to monitor sodium intake and continue adhering to a heart healthy diet. Advised to read nutritional labels and avoid highly processed  foods when possible. Reports engaging in low impact exercises regularly. Participates in [place] weekly. Advised to continue engaging in low impact activities as tolerated. Reviewed s/sx of heart attack, stroke and worsening symptoms that require immediate medical attention.     Self-Care/Patient  Goals: Take medications as prescribed Attend medical appointments as scheduled Adhere to a heart healthy/cardiac prudent diet. Engage in low impact activities as tolerated Contact the clinic or notify the care management team with new questions or concerns if needed    Follow Up Plan:  Will follow up next month       Mrs. Pellegrin verbalized understanding of the information discussed during the telephonic outreach today. Declined need for mailed/printed instructions. A member of the care management team will follow up next month.    Cristy Friedlander [facility] Care Management [facility] [phone]

## 2020-10-23 NOTE — Chronic Care Management (AMB) (Signed)
Chronic Care Management   CCM RN Visit Note  10/23/2020 Name: Heather Singh MRN: [id number] DOB: [birth year]  Subjective: Heather Singh is a 75 y.o. year old female who is a primary care patient of Steele Sizer, MD. The care management team was consulted for assistance with disease management and care coordination needs.    Engaged with patient by telephone for initial visit in response to provider referral for case management and/or care coordination services.   Consent to Services:  The patient was given the following information about Chronic Care Management services, agreed to services, and gave verbal consent: 1. CCM service includes personalized support from designated clinical staff supervised by the primary care provider, including individualized plan of care and coordination with other care providers 2. 24/7 contact phone numbers for assistance for urgent and routine care needs. 3. Service will only be billed when office clinical staff spend 20 minutes or more in a month to coordinate care. 4. Only one practitioner may furnish and bill the service in a calendar month. 5.The patient may stop CCM services at any time (effective at the end of the month) by phone call to the office staff. 6. The patient will be responsible for cost sharing (co-pay) of up to 20% of the service fee (after annual deductible is met). Patient agreed to services and consent obtained.   Assessment: Review of patient past medical history, allergies, medications, health status, including review of consultants reports, laboratory and other test data, was performed as part of comprehensive evaluation and provision of chronic care management services.   SDOH (Social Determinants of Health) assessments and interventions performed:  SDOH Interventions    Flowsheet Row Most Recent Value  SDOH Interventions   Food Insecurity Interventions Intervention Not Indicated  Transportation Interventions Intervention Not  Indicated        CCM Care Plan  Allergies  Allergen Reactions   Alendronate     Dyspepsia, inflamed esophagus    Levofloxacin Hives   Nitrofurantoin Monohyd Macro Hives   Sulfamethoxazole-Trimethoprim Hives   Etodolac Hives   Penicillins Hives and Rash    Has patient had a PCN reaction causing immediate rash, facial/tongue/throat swelling, SOB or lightheadedness with hypotension: No Has patient had a PCN reaction causing severe rash involving mucus membranes or skin necrosis: No Has patient had a PCN reaction that required hospitalization: No Has patient had a PCN reaction occurring within the last 10 years: No If all of the above answers are "NO", then may proceed with Cephalosporin use.     Outpatient Encounter Medications as of 10/23/2020  Medication Sig   atorvastatin (LIPITOR) 40 MG tablet Take 1 tablet (40 mg total) by mouth daily.   calcium carbonate (TUMS EX) 750 MG chewable tablet Chew 1 tablet by mouth daily.   Cholecalciferol (VITAMIN D) 2000 units CAPS Take 1 capsule by mouth daily.   denosumab (PROLIA) 60 MG/ML SOSY injection Inject 60 mg into the skin every 6 (six) months.   fexofenadine (ALLEGRA ALLERGY) 180 MG tablet Take 1 tablet (180 mg total) by mouth daily.   levothyroxine (SYNTHROID) 25 MCG tablet TAKE 1 TABLET BY MOUTH DAILY BEFORE BREAKFAST. ALTERNATING WITH 50 MCG EVERY OTHER DAY   Magnesium 400 MG TABS Take 1 tablet by mouth daily.   omeprazole (PRILOSEC) 40 MG capsule Take 1 capsule (40 mg total) by mouth daily.   Polyethylene Glycol 400 0.25 % SOLN Place 1 drop into both eyes 2 (two) times daily as needed (dry eyes).  vitamin B-12 (CYANOCOBALAMIN) 1000 MCG tablet Take 1,000 mcg by mouth 3 (three) times a week.   No facility-administered encounter medications on file as of 10/23/2020.    Patient Active Problem List   Diagnosis Date Noted   Hx of adenomatous colonic polyps 05/19/2018   Senile purpura ([place]) 04/28/2018   Closed compression fracture of  L3 lumbar vertebra, sequela 08/23/2016   Atrophic vaginitis 06/05/2015   Acquired hypothyroidism 10/19/2014   Perennial allergic rhinitis 10/19/2014   B12 deficiency 10/19/2014   Fibrocystic breast disease 10/19/2014   Gastro-esophageal reflux disease without esophagitis 10/19/2014   OP (osteoporosis) 10/19/2014   Ovarian failure 10/19/2014   Hiatal hernia 10/19/2014   Restless legs syndrome 10/19/2014   ABNORMAL EKG 09/03/2009   Dyslipidemia 08/23/2008   Hyperlipidemia 08/23/2008   Vitamin D deficiency 10/10/2007    Conditions to be addressed/monitored:HLD  Patient Care Plan: Hyperlipidemia     Problem Identified: Hyperlipidemia      Long-Range Goal: Hyperlipidemia Monitored   Start Date: 10/23/2020  Expected End Date: 01/21/2021  Priority: Medium  Note:   Objective:   Lab Results  Component Value Date   CHOL 164 05/05/2020   HDL 82 05/05/2020   LDLCALC 69 05/05/2020   TRIG 53 05/05/2020   CHOLHDL 2.0 05/05/2020     BP Readings from Last 3 Encounters:  05/15/20 124/70  05/05/20 128/82  11/02/19 110/80   Current Barriers:  Chronic Disease Management support related to Hyperlipidemia self management.  Case Manager Clinical Goal(s):  Over the next 90 days, patient will demonstrate adherence to treatment plan as evidenced by taking medications as prescribed, adhering to a heart healthy diet and engaging in activity as tolerated.  Interventions:  Collaboration with Steele Sizer, MD regarding development and update of comprehensive plan of care as evidenced by provider attestation and co-signature Inter-disciplinary care team collaboration (see longitudinal plan of care) Evaluation of current treatment plan related to hyperlipidemia self management as established by provider. Reviewed medications and discussed importance of compliance. Encouraged to continue taking medications as prescribed. Advised to notify provider if unable to tolerate prescribed regimen.   Discussed nutritional intake and activity level. Advised to monitor sodium intake and continue adhering to a heart healthy diet. Advised to read nutritional labels and avoid highly processed  foods when possible. Reports engaging in low impact exercises regularly. Participates in [place] weekly. Advised to continue engaging in low impact activities as tolerated. Reviewed s/sx of heart attack, stroke and worsening symptoms that require immediate medical attention.     Self-Care/Patient Goals: Take medications as prescribed Attend medical appointments as scheduled Adhere to a heart healthy/cardiac prudent diet. Engage in low impact activities as tolerated Contact the clinic or notify the care management team with new questions or concerns if needed    Follow Up Plan:  Will follow up next month       PLAN A member of the care management team will follow up with Mrs. Chambless next month.    Cristy Friedlander [facility] Care Management [facility] [phone]

## 2020-10-30 NOTE — Progress Notes (Signed)
Name: Heather Singh   MRN: [id number]    DOB: 1945-05-21   Date:11/04/2020       Progress Note  Subjective  Chief Complaint  Follow Up  HPI  Osteoporosis: she has been seeing Dr. Gabriel Carina, [place] started 10/2016. She is tolerating medication well, she had a bone density done by Dr. Gabriel Carina had her first Prolia Fall 2020, last done May 2022 and is going back in September for bone density and follow up with Dr. Gabriel Carina   GERD and hiatal hernia : Has been seeing Dr. Tami Ribas and he advised  Omeprazole '40mg'$ . Says symptoms have been well controlled without hurt burn or indigestion.  Still has a dry cough that is stable.  She saw pulmonologist , Dr. Raul Del Fall 2021. He changed from loratadine to fexofenadine but she goes back and forth on different types of anti-histamines   Hypothyroidism: she is taking levothyroxine as prescribed, taking one tablet one day and two every other day, TSH has been stable for a while. No change in bowel movements , dry skin skin but stable. . No dysphagia.    Hyperlipidemia/Atherosclerosis of aorta : She is on Atorvastatin and last LDL was down to 62, no myalgia.   Senile purpura: stable, usually on arms , reassurance given    Chronic constipation: doing well on Miralax , se is now taking it a few times a week otherwise gives her diarrhea  Bristol scale 4 when she takes medication sometimes it is a 3 , stable   Vitamin B12 deficiency and vitamin D deficiency continue supplements   Patient Active Problem List   Diagnosis Date Noted   Hx of adenomatous colonic polyps 05/19/2018   Senile purpura ([place]) 04/28/2018   Closed compression fracture of L3 lumbar vertebra, sequela 08/23/2016   Atrophic vaginitis 06/05/2015   Acquired hypothyroidism 10/19/2014   Perennial allergic rhinitis 10/19/2014   B12 deficiency 10/19/2014   Fibrocystic breast disease 10/19/2014   Gastro-esophageal reflux disease without esophagitis 10/19/2014   OP (osteoporosis) 10/19/2014   Ovarian  failure 10/19/2014   Hiatal hernia 10/19/2014   Restless legs syndrome 10/19/2014   ABNORMAL EKG 09/03/2009   Dyslipidemia 08/23/2008   Hyperlipidemia 08/23/2008   Vitamin D deficiency 10/10/2007    Past Surgical History:  Procedure Laterality Date   BREAST CYST ASPIRATION Left    BREAST EXCISIONAL BIOPSY Left 1980's   neg   CATARACT EXTRACTION     EYE SURGERY     left   KYPHOPLASTY N/A 08/19/2016   Procedure: KYPHOPLASTY L3,L4;  Surgeon: Hessie Knows, MD;  Location: [facility] ORS;  Service: Orthopedics;  Laterality: N/A;   KYPHOPLASTY N/A 09/21/2016   Procedure: [id number];  Surgeon: Hessie Knows, MD;  Location: [facility] ORS;  Service: Orthopedics;  Laterality: N/A;   MOUTH SURGERY     POLYPECTOMY     SLING BLADE PROCEDURE     TOTAL ABDOMINAL HYSTERECTOMY      Family History  Problem Relation Age of Onset   Transient ischemic attack Mother    Atrial fibrillation Mother    Arthritis Mother        OA   Heart disease Father        CHF   CAD Father    Hyperlipidemia Sister    Arthritis Sister        OA   Drug abuse Son    Hypertension Son    Stroke Sister        Lived in a nursing home and died from COVID-53  Arthritis Sister        OA   Heart failure Other    Coronary artery disease Other    Stroke Other    Hyperlipidemia Other    Hypertension Other    Thyroid disease Other    Breast cancer Maternal Aunt    Breast cancer Paternal Aunt     Social History   Tobacco Use   Smoking status: Never   Smokeless tobacco: Never  Substance Use Topics   Alcohol use: No    Alcohol/week: 0.0 standard drinks     Current Outpatient Medications:    atorvastatin (LIPITOR) 40 MG tablet, Take 1 tablet (40 mg total) by mouth daily., Disp: 90 tablet, Rfl: 1   calcium carbonate (TUMS EX) 750 MG chewable tablet, Chew 1 tablet by mouth daily., Disp: , Rfl:    Cholecalciferol (VITAMIN D) 2000 units CAPS, Take 1 capsule by mouth daily., Disp: , Rfl:    denosumab (PROLIA) 60 MG/ML  SOSY injection, Inject 60 mg into the skin every 6 (six) months., Disp: , Rfl:    fexofenadine (ALLEGRA ALLERGY) 180 MG tablet, Take 1 tablet (180 mg total) by mouth daily., Disp: 90 tablet, Rfl: 0   levothyroxine (SYNTHROID) 25 MCG tablet, TAKE 1 TABLET BY MOUTH DAILY BEFORE BREAKFAST. ALTERNATING WITH 50 MCG EVERY OTHER DAY, Disp: 135 tablet, Rfl: 1   Magnesium 400 MG TABS, Take 1 tablet by mouth daily., Disp: , Rfl:    omeprazole (PRILOSEC) 40 MG capsule, Take 1 capsule (40 mg total) by mouth daily., Disp: 90 capsule, Rfl: 1   vitamin B-12 (CYANOCOBALAMIN) 1000 MCG tablet, Take 1,000 mcg by mouth 3 (three) times a week., Disp: , Rfl:   Allergies  Allergen Reactions   Alendronate     Dyspepsia, inflamed esophagus    Levofloxacin Hives   Nitrofurantoin Monohyd Macro Hives   Sulfamethoxazole-Trimethoprim Hives   Etodolac Hives   Penicillins Hives and Rash    Has patient had a PCN reaction causing immediate rash, facial/tongue/throat swelling, SOB or lightheadedness with hypotension: No Has patient had a PCN reaction causing severe rash involving mucus membranes or skin necrosis: No Has patient had a PCN reaction that required hospitalization: No Has patient had a PCN reaction occurring within the last 10 years: No If all of the above answers are "NO", then may proceed with Cephalosporin use.     I personally reviewed active problem list, medication list, allergies, family history, social history, health maintenance with the patient/caregiver today.   ROS  Constitutional: Negative for fever , positive for mild weight change.  Respiratory: positive for chronic cough but no  shortness of breath.   Cardiovascular: Negative for chest pain or palpitations.  Gastrointestinal: Negative for abdominal pain, no bowel changes.  Musculoskeletal: Negative for gait problem or joint swelling.  Skin: Negative for rash.  Neurological: Negative for dizziness or headache.  No other specific  complaints in a complete review of systems (except as listed in HPI above).   Objective  Vitals:   11/04/20 0952  BP: 112/62  Pulse: 81  Resp: 16  Temp: 98.3 F (36.8 C)  SpO2: 99%  Weight: 104 lb (47.2 kg)  Height: '5\' 2"'$  (1.575 m)    Body mass index is 19.02 kg/m.  Physical Exam  Constitutional: Patient appears well-developed and well-nourished.  No distress.  HEENT: head atraumatic, normocephalic, pupils equal and reactive to light, neck supple Cardiovascular: Normal rate, regular rhythm and normal heart sounds.  No murmur heard. No BLE edema.  Pulmonary/Chest: Effort normal and breath sounds normal. No respiratory distress. Abdominal: Soft.  There is no tenderness. Skin: senile purpura  Psychiatric: Patient has a normal mood and affect. behavior is normal. Judgment and thought content normal.   PHQ2/9: Depression screen [facility] 2/9 11/04/2020 10/23/2020 05/15/2020 05/05/2020 11/02/2019  Decreased Interest 0 0 0 0 0  Down, Depressed, Hopeless 0 0 0 0 0  PHQ - 2 Score 0 0 0 0 0  Altered sleeping - - - - 0  Tired, decreased energy - - - - 0  Change in appetite - - - - 0  Feeling bad or failure about yourself  - - - - 0  Trouble concentrating - - - - 0  Moving slowly or fidgety/restless - - - - 0  Suicidal thoughts - - - - 0  PHQ-9 Score - - - - 0  Difficult doing work/chores - - - - -    phq 9 is negative   Fall Risk: Fall Risk  11/04/2020 10/23/2020 05/15/2020 05/05/2020 11/02/2019  Falls in the past year? 0 0 0 0 0  Number falls in past yr: 0 - 0 0 -  Injury with Fall? 0 - 0 0 -  Risk for fall due to : No Fall Risks - No Fall Risks - -  Follow up Falls prevention discussed Falls prevention discussed Falls prevention discussed - -     Functional Status Survey: Is the patient deaf or have difficulty hearing?: Yes Does the patient have difficulty seeing, even when wearing glasses/contacts?: No Does the patient have difficulty concentrating, remembering, or making decisions?:  No Does the patient have difficulty walking or climbing stairs?: No Does the patient have difficulty dressing or bathing?: No Does the patient have difficulty doing errands alone such as visiting a doctor's office or shopping?: No    Assessment & Plan  1. Atherosclerosis of aorta (HCC)  - atorvastatin (LIPITOR) 40 MG tablet; Take 1 tablet (40 mg total) by mouth daily.  Dispense: 90 tablet; Refill: 1  2. Senile purpura ([place])   3. Dyslipidemia   4. B12 deficiency   5. Age-related osteoporosis without current pathological fracture   6. Vitamin D deficiency   7. Acquired hypothyroidism  - levothyroxine (SYNTHROID) 25 MCG tablet; Take 1-2 tablets (25-50 mcg total) by mouth daily before breakfast. Alternating every other day  Dispense: 135 tablet; Refill: 1  8. Chronic constipation   9. Gastro-esophageal reflux disease without esophagitis  - omeprazole (PRILOSEC) 40 MG capsule; Take 1 capsule (40 mg total) by mouth daily.  Dispense: 90 capsule; Refill: 1  10. Chronic cough  - omeprazole (PRILOSEC) 40 MG capsule; Take 1 capsule (40 mg total) by mouth daily.  Dispense: 90 capsule; Refill: 1  11. Gastroesophageal reflux disease without esophagitis   Stable

## 2020-11-04 ENCOUNTER — Encounter: Payer: Self-pay | Admitting: Family Medicine

## 2020-11-04 ENCOUNTER — Ambulatory Visit (INDEPENDENT_AMBULATORY_CARE_PROVIDER_SITE_OTHER): Payer: Medicare Other | Admitting: Family Medicine

## 2020-11-04 ENCOUNTER — Other Ambulatory Visit: Payer: Self-pay

## 2020-11-04 VITALS — BP 112/62 | HR 81 | Temp 98.3°F | Resp 16 | Ht 62.0 in | Wt 104.0 lb

## 2020-11-04 DIAGNOSIS — D692 Other nonthrombocytopenic purpura: Secondary | ICD-10-CM

## 2020-11-04 DIAGNOSIS — E538 Deficiency of other specified B group vitamins: Secondary | ICD-10-CM

## 2020-11-04 DIAGNOSIS — E039 Hypothyroidism, unspecified: Secondary | ICD-10-CM | POA: Diagnosis not present

## 2020-11-04 DIAGNOSIS — M810 Age-related osteoporosis without current pathological fracture: Secondary | ICD-10-CM

## 2020-11-04 DIAGNOSIS — I700 Atherosclerosis of aorta: Secondary | ICD-10-CM

## 2020-11-04 DIAGNOSIS — E785 Hyperlipidemia, unspecified: Secondary | ICD-10-CM | POA: Diagnosis not present

## 2020-11-04 DIAGNOSIS — R053 Chronic cough: Secondary | ICD-10-CM

## 2020-11-04 DIAGNOSIS — K5909 Other constipation: Secondary | ICD-10-CM | POA: Diagnosis not present

## 2020-11-04 DIAGNOSIS — E559 Vitamin D deficiency, unspecified: Secondary | ICD-10-CM | POA: Diagnosis not present

## 2020-11-04 DIAGNOSIS — K219 Gastro-esophageal reflux disease without esophagitis: Secondary | ICD-10-CM

## 2020-11-04 MED ORDER — LEVOTHYROXINE SODIUM 25 MCG PO TABS: 25.0000 ug | ORAL_TABLET | Freq: Every day | ORAL | 1 refills | Status: DC

## 2020-11-04 MED ORDER — OMEPRAZOLE 40 MG PO CPDR: 40.0000 mg | DELAYED_RELEASE_CAPSULE | Freq: Every day | ORAL | 1 refills | Status: DC

## 2020-11-04 MED ORDER — ATORVASTATIN CALCIUM 40 MG PO TABS: 40.0000 mg | ORAL_TABLET | Freq: Every day | ORAL | 1 refills | Status: DC

## 2020-11-22 ENCOUNTER — Other Ambulatory Visit: Payer: Self-pay | Admitting: Family Medicine

## 2020-11-22 DIAGNOSIS — E039 Hypothyroidism, unspecified: Secondary | ICD-10-CM

## 2020-11-22 NOTE — Telephone Encounter (Signed)
med is ready for her - Pharmacist stated to ignore request.

## 2020-11-27 ENCOUNTER — Ambulatory Visit (INDEPENDENT_AMBULATORY_CARE_PROVIDER_SITE_OTHER): Payer: Medicare Other

## 2020-11-27 DIAGNOSIS — E782 Mixed hyperlipidemia: Secondary | ICD-10-CM

## 2020-11-27 NOTE — Chronic Care Management (AMB) (Signed)
Chronic Care Management   CCM RN Visit Note  11/27/2020 Name: Heather Singh MRN: [id number] DOB: 12-19-1945  Subjective: Heather Singh is a 75 y.o. year old female who is a primary care patient of Steele Sizer, MD. The care management team was consulted for assistance with disease management and care coordination needs.    Engaged with patient by telephone for follow up visit in response to provider referral for case management and care coordination services.   Consent to Services:  The patient was given information about Chronic Care Management services, agreed to services, and gave verbal consent prior to initiation of services.  Please see initial visit note for detailed documentation.    Assessment: Review of patient past medical history, allergies, medications, health status, including review of consultants reports, laboratory and other test data, was performed as part of comprehensive evaluation and provision of chronic care management services.   SDOH (Social Determinants of Health) assessments and interventions performed: No  CCM Care Plan  Allergies  Allergen Reactions   Alendronate     Dyspepsia, inflamed esophagus    Levofloxacin Hives   Nitrofurantoin Monohyd Macro Hives   Sulfamethoxazole-Trimethoprim Hives   Etodolac Hives   Penicillins Hives and Rash    Has patient had a PCN reaction causing immediate rash, facial/tongue/throat swelling, SOB or lightheadedness with hypotension: No Has patient had a PCN reaction causing severe rash involving mucus membranes or skin necrosis: No Has patient had a PCN reaction that required hospitalization: No Has patient had a PCN reaction occurring within the last 10 years: No If all of the above answers are "NO", then may proceed with Cephalosporin use.     Outpatient Encounter Medications as of 11/27/2020  Medication Sig   atorvastatin (LIPITOR) 40 MG tablet Take 1 tablet (40 mg total) by mouth daily.   calcium carbonate  (TUMS EX) 750 MG chewable tablet Chew 1 tablet by mouth daily.   Cholecalciferol (VITAMIN D) 2000 units CAPS Take 1 capsule by mouth daily.   denosumab (PROLIA) 60 MG/ML SOSY injection Inject 60 mg into the skin every 6 (six) months.   fexofenadine (ALLEGRA ALLERGY) 180 MG tablet Take 1 tablet (180 mg total) by mouth daily.   levothyroxine (SYNTHROID) 25 MCG tablet Take 1-2 tablets (25-50 mcg total) by mouth daily before breakfast. Alternating every other day   Magnesium 400 MG TABS Take 1 tablet by mouth daily.   omeprazole (PRILOSEC) 40 MG capsule Take 1 capsule (40 mg total) by mouth daily.   vitamin B-12 (CYANOCOBALAMIN) 1000 MCG tablet Take 1,000 mcg by mouth 3 (three) times a week.   No facility-administered encounter medications on file as of 11/27/2020.    Patient Active Problem List   Diagnosis Date Noted   Hx of adenomatous colonic polyps 05/19/2018   Senile purpura ([place]) 04/28/2018   Closed compression fracture of L3 lumbar vertebra, sequela 08/23/2016   Atrophic vaginitis 06/05/2015   Acquired hypothyroidism 10/19/2014   Perennial allergic rhinitis 10/19/2014   B12 deficiency 10/19/2014   Fibrocystic breast disease 10/19/2014   Gastro-esophageal reflux disease without esophagitis 10/19/2014   OP (osteoporosis) 10/19/2014   Ovarian failure 10/19/2014   Hiatal hernia 10/19/2014   Restless legs syndrome 10/19/2014   Dyslipidemia 08/23/2008   Hyperlipidemia 08/23/2008   Vitamin D deficiency 10/10/2007    Conditions to be addressed/monitored:HLD Patient Care Plan: Hyperlipidemia     Problem Identified: Hyperlipidemia      Long-Range Goal: Hyperlipidemia Monitored   Start Date: 11/27/2020  Expected End  Date: 03/27/2021  Priority: Medium  Note:    Current Barriers:  Chronic Disease Management support related to Hyperlipidemia self management.  Case Manager Clinical Goal(s):  Over the next 120 days, patient will demonstrate adherence to treatment plan as evidenced by  taking medications as prescribed, adhering to a heart healthy diet and engaging in activity as tolerated.  Interventions:  Collaboration with Steele Sizer, MD regarding development and update of comprehensive plan of care as evidenced by provider attestation and co-signature Inter-disciplinary care team collaboration (see longitudinal plan of care) Reviewed medications and compliance with recommended treatment plan. Reports excellent compliance with prescribed medications. Tolerating regimen without complications. Discussed nutritional intake and activity level. Reports doing very well with recommended diet. Monitoring sodium intake and reading nutrition labels as advised. Continues to engage in regular low impact activity. Remains very motivated to improve her overall health.  Reviewed s/sx of heart attack, stroke and worsening symptoms that require immediate medical attention.    Self-Care/Patient Goals: Take medications as prescribed Attend medical appointments as scheduled Adhere to a heart healthy/cardiac prudent diet. Continue engaging in low impact activities as tolerated Contact the clinic or notify the care management team with new questions or concerns if needed   Follow Up Plan:  Will follow up in 6 months        PLAN A member of the care management team will follow up in 6 months.   Cristy Friedlander [facility] Care Management [facility] [phone]

## 2020-11-27 NOTE — Patient Instructions (Addendum)
Thank you for allowing the Chronic Care Management team to participate in your care.    Patient Care Plan: Hyperlipidemia     Problem Identified: Hyperlipidemia      Long-Range Goal: Hyperlipidemia Monitored   Start Date: 11/27/2020  Expected End Date: 03/27/2021  Priority: Medium  Note:    Current Barriers:  Chronic Disease Management support related to Hyperlipidemia self management.  Case Manager Clinical Goal(s):  Over the next 120 days, patient will demonstrate adherence to treatment plan as evidenced by taking medications as prescribed, adhering to a heart healthy diet and engaging in activity as tolerated.  Interventions:  Collaboration with Steele Sizer, MD regarding development and update of comprehensive plan of care as evidenced by provider attestation and co-signature Inter-disciplinary care team collaboration (see longitudinal plan of care) Reviewed medications and compliance with recommended treatment plan. Reports excellent compliance with prescribed medications. Tolerating regimen without complications. Discussed nutritional intake and activity level. Reports doing very well with recommended diet. Monitoring sodium intake and reading nutrition labels as advised. Continues to engage in regular low impact activity. Remains very motivated to improve her overall health.  Reviewed s/sx of heart attack, stroke and worsening symptoms that require immediate medical attention.    Self-Care/Patient Goals: Take medications as prescribed Attend medical appointments as scheduled Adhere to a heart healthy/cardiac prudent diet. Continue engaging in low impact activities as tolerated Contact the clinic or notify the care management team with new questions or concerns if needed   Follow Up Plan:  Will follow up in 6 months        Heather Singh verbalized understanding of the information discussed during the telephonic outreach. Declined need for mailed/printed  instructions. A member of the care management team will follow up in 6 months.   Heather Singh [facility] Care Management [facility] [phone]

## 2020-12-01 ENCOUNTER — Ambulatory Visit
Admission: RE | Admit: 2020-12-01 | Discharge: 2020-12-01 | Disposition: A | Discharge status: Home / Self Care | Payer: Medicare Other | Source: Ambulatory Visit | Attending: Internal Medicine | Admitting: Internal Medicine

## 2020-12-01 ENCOUNTER — Other Ambulatory Visit: Payer: Self-pay

## 2020-12-01 DIAGNOSIS — M810 Age-related osteoporosis without current pathological fracture: Secondary | ICD-10-CM | POA: Insufficient documentation

## 2020-12-01 DIAGNOSIS — Z780 Asymptomatic menopausal state: Secondary | ICD-10-CM | POA: Diagnosis not present

## 2020-12-10 DIAGNOSIS — M810 Age-related osteoporosis without current pathological fracture: Secondary | ICD-10-CM | POA: Diagnosis not present

## 2020-12-12 DIAGNOSIS — E782 Mixed hyperlipidemia: Secondary | ICD-10-CM | POA: Diagnosis not present

## 2020-12-29 DIAGNOSIS — J340 Abscess, furuncle and carbuncle of nose: Secondary | ICD-10-CM | POA: Diagnosis not present

## 2021-01-01 ENCOUNTER — Other Ambulatory Visit: Payer: Self-pay

## 2021-01-01 ENCOUNTER — Ambulatory Visit (INDEPENDENT_AMBULATORY_CARE_PROVIDER_SITE_OTHER): Payer: Medicare Other

## 2021-01-01 DIAGNOSIS — Z23 Encounter for immunization: Secondary | ICD-10-CM | POA: Diagnosis not present

## 2021-01-12 DIAGNOSIS — H40003 Preglaucoma, unspecified, bilateral: Secondary | ICD-10-CM | POA: Diagnosis not present

## 2021-01-13 ENCOUNTER — Other Ambulatory Visit: Payer: Self-pay | Admitting: Family Medicine

## 2021-01-13 DIAGNOSIS — Z1231 Encounter for screening mammogram for malignant neoplasm of breast: Secondary | ICD-10-CM

## 2021-01-19 DIAGNOSIS — H2703 Aphakia, bilateral: Secondary | ICD-10-CM | POA: Diagnosis not present

## 2021-01-21 DIAGNOSIS — M810 Age-related osteoporosis without current pathological fracture: Secondary | ICD-10-CM | POA: Diagnosis not present

## 2021-02-13 ENCOUNTER — Ambulatory Visit
Admission: RE | Admit: 2021-02-13 | Discharge: 2021-02-13 | Disposition: A | Discharge status: Home / Self Care | Payer: Medicare Other | Source: Ambulatory Visit | Attending: Family Medicine | Admitting: Family Medicine

## 2021-02-13 ENCOUNTER — Other Ambulatory Visit: Payer: Self-pay

## 2021-02-13 DIAGNOSIS — Z1231 Encounter for screening mammogram for malignant neoplasm of breast: Secondary | ICD-10-CM | POA: Diagnosis not present

## 2021-03-05 DIAGNOSIS — M778 Other enthesopathies, not elsewhere classified: Secondary | ICD-10-CM | POA: Diagnosis not present

## 2021-03-05 DIAGNOSIS — B351 Tinea unguium: Secondary | ICD-10-CM | POA: Diagnosis not present

## 2021-03-05 DIAGNOSIS — M79672 Pain in left foot: Secondary | ICD-10-CM | POA: Diagnosis not present

## 2021-03-19 ENCOUNTER — Other Ambulatory Visit: Payer: Self-pay

## 2021-03-19 ENCOUNTER — Emergency Department: Payer: Medicare Other

## 2021-03-19 ENCOUNTER — Emergency Department
Admission: EM | Admit: 2021-03-19 | Discharge: 2021-03-19 | Disposition: A | Discharge status: Home / Self Care | Payer: Medicare Other | Attending: Emergency Medicine | Admitting: Emergency Medicine

## 2021-03-19 DIAGNOSIS — I6523 Occlusion and stenosis of bilateral carotid arteries: Secondary | ICD-10-CM | POA: Diagnosis not present

## 2021-03-19 DIAGNOSIS — I629 Nontraumatic intracranial hemorrhage, unspecified: Secondary | ICD-10-CM | POA: Diagnosis not present

## 2021-03-19 DIAGNOSIS — R4789 Other speech disturbances: Secondary | ICD-10-CM | POA: Diagnosis not present

## 2021-03-19 DIAGNOSIS — I6782 Cerebral ischemia: Secondary | ICD-10-CM | POA: Diagnosis not present

## 2021-03-19 DIAGNOSIS — R791 Abnormal coagulation profile: Secondary | ICD-10-CM | POA: Diagnosis not present

## 2021-03-19 DIAGNOSIS — R410 Disorientation, unspecified: Secondary | ICD-10-CM | POA: Insufficient documentation

## 2021-03-19 DIAGNOSIS — I771 Stricture of artery: Secondary | ICD-10-CM | POA: Diagnosis not present

## 2021-03-19 DIAGNOSIS — Z20822 Contact with and (suspected) exposure to covid-19: Secondary | ICD-10-CM | POA: Insufficient documentation

## 2021-03-19 DIAGNOSIS — R29818 Other symptoms and signs involving the nervous system: Secondary | ICD-10-CM | POA: Diagnosis not present

## 2021-03-19 DIAGNOSIS — R4182 Altered mental status, unspecified: Secondary | ICD-10-CM | POA: Diagnosis present

## 2021-03-19 DIAGNOSIS — R9431 Abnormal electrocardiogram [ECG] [EKG]: Secondary | ICD-10-CM | POA: Diagnosis not present

## 2021-03-19 LAB — COMPREHENSIVE METABOLIC PANEL
ALT: 15 U/L (ref 0–44)
AST: 23 U/L (ref 15–41)
Albumin: 4.1 g/dL (ref 3.5–5.0)
Alkaline Phosphatase: 42 U/L (ref 38–126)
Anion gap: 8 (ref 5–15)
BUN: 18 mg/dL (ref 8–23)
CO2: 31 mmol/L (ref 22–32)
Calcium: 9.4 mg/dL (ref 8.9–10.3)
Chloride: 100 mmol/L (ref 98–111)
Creatinine, Ser: 0.69 mg/dL (ref 0.44–1.00)
GFR, Estimated: >60 mL/min (ref >60)
Glucose, Bld: 95 mg/dL (ref 70–99)
Potassium: 3.9 mmol/L (ref 3.5–5.1)
Sodium: 139 mmol/L (ref 135–145)
Total Bilirubin: 0.8 mg/dL (ref 0.3–1.2)
Total Protein: 6.9 g/dL (ref 6.5–8.1)

## 2021-03-19 LAB — CBC
HCT: 36.5 % (ref 36.0–46.0)
Hemoglobin: 11.9 g/dL — ABNORMAL LOW (ref 12.0–15.0)
MCH: 31.5 pg (ref 26.0–34.0)
MCHC: 32.6 g/dL (ref 30.0–36.0)
MCV: 96.6 fL (ref 80.0–100.0)
Platelets: 234 10*3/uL (ref 150–400)
RBC: 3.78 MIL/uL — ABNORMAL LOW (ref 3.87–5.11)
RDW: 12.5 % (ref 11.5–15.5)
WBC: 3.8 10*3/uL — ABNORMAL LOW (ref 4.0–10.5)
nRBC: 0 % (ref 0.0–0.2)

## 2021-03-19 LAB — PROTIME-INR
INR: 0.8 (ref 0.8–1.2)
Prothrombin Time: 11.2 seconds — ABNORMAL LOW (ref 11.4–15.2)

## 2021-03-19 LAB — DIFFERENTIAL
Abs Immature Granulocytes: 0.01 10*3/uL (ref 0.00–0.07)
Basophils Absolute: 0 10*3/uL (ref 0.0–0.1)
Basophils Relative: 1 %
Eosinophils Absolute: 0.1 10*3/uL (ref 0.0–0.5)
Eosinophils Relative: 2 %
Immature Granulocytes: 0 %
Lymphocytes Relative: 40 %
Lymphs Abs: 1.5 10*3/uL (ref 0.7–4.0)
Monocytes Absolute: 0.4 10*3/uL (ref 0.1–1.0)
Monocytes Relative: 10 %
Neutro Abs: 1.8 10*3/uL (ref 1.7–7.7)
Neutrophils Relative %: 47 %

## 2021-03-19 LAB — URINALYSIS, ROUTINE W REFLEX MICROSCOPIC
Bacteria, UA: NONE SEEN
Bilirubin Urine: NEGATIVE
Glucose, UA: NEGATIVE mg/dL
Ketones, ur: 20 mg/dL — AB
Nitrite: NEGATIVE
Protein, ur: NEGATIVE mg/dL
Specific Gravity, Urine: 1.033 — ABNORMAL HIGH (ref 1.005–1.030)
pH: 7 (ref 5.0–8.0)

## 2021-03-19 LAB — APTT: aPTT: 27 seconds (ref 24–36)

## 2021-03-19 LAB — RESP PANEL BY RT-PCR (FLU A&B, COVID) ARPGX2
Influenza A by PCR: NEGATIVE
Influenza B by PCR: NEGATIVE
SARS Coronavirus 2 by RT PCR: NEGATIVE

## 2021-03-19 MED ORDER — BUTALBITAL-APAP-CAFFEINE 50-325-40 MG PO TABS
2.0000 | ORAL_TABLET | Freq: Once | ORAL | Status: AC
Start: 2021-03-19 — End: 2021-03-19
  Administered 2021-03-19: 2 via ORAL
  Filled 2021-03-19: qty 2

## 2021-03-19 MED ORDER — IOHEXOL 350 MG/ML SOLN
75.0000 mL | Freq: Once | INTRAVENOUS | Status: AC | PRN
  Administered 2021-03-19: 75 mL via INTRAVENOUS

## 2021-03-19 MED ORDER — SODIUM CHLORIDE 0.9% FLUSH
3.0000 mL | Freq: Once | INTRAVENOUS | Status: AC
  Administered 2021-03-19: 3 mL via INTRAVENOUS

## 2021-03-19 NOTE — ED Provider Notes (Signed)
[facility] Provider Note    Event Date/Time   First MD Initiated Contact with Patient 03/19/21 1459     (approximate)   History   Altered Mental Status   HPI  Heather Singh is a 76 y.o. female who presents to the ED for evaluation of Altered Mental Status I reviewed PCP visit from 8/23.  History of hypothyroidism, GERD, HLD.  Patient presents to the ED, accompanied by her husband for evaluation of the stranger this morning.  Patient has quite a bit of difficulty describing what happened this morning.  She reports transient, hours long difficulty texting on her phone, difficulty getting her words out, mild global headache, and "just feeling confused.  "  She reports no falls, syncopal episodes or injuries.  Reports no weakness, gait changes or dropping anything from her hands, but does report inability to use her phone.  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  She reports no fevers or recent illnesses.  No neck stiffness.   Physical Exam   Triage Vital Signs: ED Triage Vitals  Enc Vitals Group     BP 03/19/21 0753 (!) 189/92     Pulse Rate 03/19/21 0753 69     Resp 03/19/21 0753 20     Temp 03/19/21 0753 98 F (36.7 C)     Temp Source 03/19/21 0753 Oral     SpO2 03/19/21 0753 99 %     Weight 03/19/21 0754 150 lb (68 kg)     Height 03/19/21 0754 5\' 3"  (1.6 m)     Head Circumference --      Peak Flow --      Pain Score 03/19/21 0754 0     Pain Loc --      Pain Edu? --      Excl. in [place]? --     Most recent vital signs: Vitals:   03/19/21 2028 03/19/21 2245  BP: 127/71 119/64  Pulse: 69 60  Resp: 18 20  Temp:    SpO2: 99% 99%    General: Awake, no distress.  Pleasant and conversational in full sentences.  No dysarthria noted. CV:  Good peripheral perfusion.  Resp:  Normal effort.  Abd:  No distention.  MSK:  No deformity noted.  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  Other:     ED Results / Procedures / Treatments   Labs (all labs ordered are listed, but only abnormal results are displayed) Labs Reviewed  PROTIME-INR - Abnormal; Notable for the following components:      Result Value   Prothrombin Time 11.2 (*)    All other components within normal limits  CBC - Abnormal; Notable for the following components:   WBC 3.8 (*)    RBC 3.78 (*)    Hemoglobin 11.9 (*)    All other components within normal limits  URINALYSIS, ROUTINE W REFLEX MICROSCOPIC - Abnormal; Notable for the following components:   Color, Urine YELLOW (*)    APPearance CLEAR (*)    Specific Gravity, Urine 1.033 (*)    Hgb urine dipstick SMALL (*)    Ketones, ur 20 (*)    Leukocytes,Ua TRACE (*)    All other components within normal limits  RESP PANEL BY RT-PCR (FLU A&B, COVID) ARPGX2  APTT  DIFFERENTIAL  COMPREHENSIVE METABOLIC PANEL    EKG Sinus rhythm, rate of 66 bpm.  Normal axis and intervals.  No evidence of acute ischemia.  RADIOLOGY CT head reviewed by me without evidence of acute ICH  Official radiology report(s): CT ANGIO HEAD NECK W WO CM  Result Date: 03/19/2021 CLINICAL DATA:  Neuro deficit, acute, stroke suspected EXAM: CT ANGIOGRAPHY HEAD AND NECK TECHNIQUE: Multidetector CT imaging of the head and neck was performed using the standard protocol during bolus administration of intravenous contrast. Multiplanar CT image reconstructions and MIPs were obtained to evaluate the vascular anatomy. Carotid stenosis measurements (when applicable) are obtained utilizing NASCET criteria, using the distal internal carotid diameter as the denominator. CONTRAST:  57mL OMNIPAQUE IOHEXOL 350 MG/ML SOLN COMPARISON:  None. FINDINGS: CT HEAD FINDINGS Brain: No evidence of acute large vascular territory  infarction, hemorrhage, hydrocephalus, extra-axial collection or mass lesion/mass effect. Patchy white matter hypoattenuation, nonspecific but compatible with chronic microvascular ischemic disease. Mildly prominent retro cerebellar CSF without significant mass effect. Vascular: No hyperdense vessel identified. Skull: No acute fracture. Sinuses: Clear sinuses. Orbits: Unremarkable orbits. Review of the MIP images confirms the above findings CTA NECK FINDINGS Aortic arch: The origin of the right brachiocephalic artery is not imaged. The imaged great vessels are patent without significant stenosis. Right carotid system: Mild atherosclerosis at the carotid bifurcation without significant (greater than 50%) stenosis. Mildly tortuous ICA. Left carotid system: Mild atherosclerosis at the carotid bifurcation without significant (greater than 50%) stenosis. Mildly tortuous ICA. Vertebral arteries: Left dominant. No evidence of dissection, stenosis (50% or greater) or occlusion. Skeleton: No evidence of acute abnormality. Other neck: No evidence of acute abnormality. Upper chest: Biapical pleuroparenchymal scarring. Otherwise, visualized lung apices are clear. Review of the MIP images confirms the above findings CTA HEAD FINDINGS Anterior circulation: Bilateral intracranial ICAs, MCAs and ACAs are patent without proximal hemodynamically significant stenosis. No aneurysm identified. Small conical outpouching arising from the inferior aspect of the right supraclinoid ICA is favored to represent an infundibulum given apparent small vessel arising from the tip. Posterior circulation: Left dominant intradural vertebral artery. Bilateral intradural vertebral arteries, basilar artery, and posterior cerebral arteries are patent without proximal hemodynamically significant stenosis. No aneurysm identified. Venous sinuses: As permitted by contrast timing, patent. Prominent arachnoid granulation in the distal right transverse sinus.  Anatomic variants: Detailed above. Review of the MIP images confirms the above findings IMPRESSION: CT head: 1. No evidence of acute intracranial abnormality. 2. Chronic microvascular ischemic disease. CTA: No large vessel occlusion or proximal hemodynamically significant stenosis in the head or neck. Electronically Signed   By: Margaretha Sheffield M.D.   On: 03/19/2021 10:38   MR BRAIN WO CONTRAST  Result Date: 03/19/2021 CLINICAL DATA:  Initial evaluation for acute speech difficulty. EXAM: MRI HEAD WITHOUT CONTRAST TECHNIQUE: Multiplanar, multiecho pulse sequences of the brain and surrounding structures were obtained without intravenous contrast. COMPARISON:  CT from earlier the same day. FINDINGS: Brain: Cerebral volume within normal limits for age. Patchy T2/FLAIR hyperintensity involving the periventricular deep white matter both cerebral hemispheres most consistent with chronic small vessel ischemic disease, moderate in nature. No abnormal foci of restricted diffusion to suggest acute or subacute ischemia. Gray-white matter differentiation maintained. No areas of chronic cortical infarction. No acute or chronic intracranial hemorrhage. No mass lesion, midline shift or mass effect. No hydrocephalus or extra-axial fluid collection. Pituitary gland suprasellar region normal. Midline structures intact. Vascular: Major intracranial vascular flow voids are maintained. Skull and upper cervical spine:  Craniocervical junction within normal limits. Bone marrow signal intensity normal. No scalp soft tissue abnormality. Sinuses/Orbits: Prior bilateral ocular lens replacement. Globes and orbital soft tissues demonstrate no acute finding. Paranasal sinuses are largely clear. No significant mastoid effusion. Other: None. IMPRESSION: 1. No acute intracranial abnormality. 2. Moderate chronic microvascular ischemic disease. Electronically Signed   By: Jeannine Boga M.D.   On: 03/19/2021 20:58    PROCEDURES and  INTERVENTIONS:  .1-3 Lead EKG Interpretation Performed by: Vladimir Crofts, MD Authorized by: Vladimir Crofts, MD     Interpretation: normal     ECG rate:  68   ECG rate assessment: normal     Rhythm: sinus rhythm     Ectopy: none     Conduction: normal    Medications  sodium chloride flush (NS) 0.9 % injection 3 mL (3 mLs Intravenous Given 03/19/21 1247)  iohexol (OMNIPAQUE) 350 MG/ML injection 75 mL (75 mLs Intravenous Contrast Given 03/19/21 1007)  butalbital-acetaminophen-caffeine (FIORICET) 50-325-40 MG per tablet 2 tablet (2 tablets Oral Given 03/19/21 1859)     IMPRESSION / MDM / ASSESSMENT AND PLAN / ED COURSE  I reviewed the triage vital signs and the nursing notes.  76 year old female presents to the ED with vague symptoms of confusion and word finding difficulties, lasting what sounds like at least 6 hours, before self resolving, without evidence of acute pathology and suitable for trial of outpatient management.  She is hypertensive in triage and this self resolves to normal vital signs on room air for much of her ED stay.  She looks clinically well to me without evidence of focal neurologic deficits that are acute.  She has some chronic visual field cut on the left that is at his baseline, otherwise without evidence of focal neurologic or vascular deficits.  No signs of trauma.  Work-up is quite benign.  No evidence of ICH, CVA or large vessel occlusion.  MRI without evidence of acute CVA.  We discussed the possibility of TIA, but her consistent symptoms for so many hours is unlikely to represent ischemia that we would not be able to appreciate on MRI.  We discussed some uncertainty from this and I did offer medical observation admission, but she declines considering the benign work-up, which I think is reasonable.  No metabolic derangements to cause sensorium changes on her metabolic panel, no signs of UTI, blood counts around baseline and her symptoms resolved after dose of Fioricet.  We  will discharge and have her follow-up closely with PCP.  Return precautions for the ED discussed.  Clinical Course as of 03/19/21 2252  Thu Mar 19, 2021  1819 Discussed plan of care.  Discussed possibility of TIA versus stroke versus UTI versus some other pathology.  We discussed medication for her headache, urinalysis and MRI brain.  Discussed the possibility of stroke and admission, observation admission for TIA, versus discharge.  They are agreeable. [DS]  2121 Reassessed.  Feeling much better.  Headache is resolved and she reports no symptoms or concerns acutely.  I reexamined her and she continues to have a reassuring neuro exam without focal deficits beyond her chronic left peripheral vision loss.  We discussed need for UA .  We discussed disposition.  We discussed the possibility of medical observation admission to work her up for a TIA, versus discharge with close PCP follow-up.  We discussed risk and benefits of each approach and she would prefer to follow-up with her PCP and not be admitted, as she has been waiting in  the hallway all day today.  She reports that she has had enough.  I think this is reasonable considering, she looks and her reassuring work-up so far.  We discussed return precautions for the ED. [DS]    Clinical Course User Index [DS] Vladimir Crofts, MD     FINAL CLINICAL IMPRESSION(S) / ED DIAGNOSES   Final diagnoses:  Confusion     Rx / DC Orders   ED Discharge Orders     None        Note:  This document was prepared using Dragon voice recognition software and may include unintentional dictation errors.   Vladimir Crofts, MD 03/19/21 2256

## 2021-03-19 NOTE — ED Provider Triage Note (Signed)
Emergency Medicine Provider Triage Evaluation Note  Heather Singh , a 76 y.o. female  was evaluated in triage.  Husband has brought patient to the ED after she woke up having difficulty with getting words out, confusion and dizziness.  Husband states they went to bed at approximately 10:00 PM and this is the way she woke up.  Review of Systems  Positive: No nausea, vomiting, diarrhea or fever. Negative: Confusion, dizziness, dysphagia  Physical Exam  BP (!) 189/92    Pulse 69    Temp 98 F (36.7 C) (Oral)    Resp 20    Ht 5\' 3"  (1.6 m)    Wt 68 kg    SpO2 99%    BMI 26.57 kg/m  Gen:   Awake, no distress patient is able to speak and no slurred speech is noted.  Patient does falls occasionally like she is thinking of a word she needs. Resp:  Normal effort lungs are clear bilaterally.    Medical Decision Making  Medically screening exam initiated at 8:27 AM.  Appropriate orders placed.  Heather Singh was informed that the remainder of the evaluation will be completed by another provider, this initial triage assessment does not replace that evaluation, and the importance of remaining in the ED until their evaluation is complete.  Husband is aware that work-up has been started.  Suspect, rule out CVA.   Johnn Hai, PA-C 03/19/21 [phone]

## 2021-03-19 NOTE — Discharge Instructions (Signed)
No signs of stroke on your MRI and no signs of UTI/bladder infection on your urine tests.  Please follow-up with your PCP.  If you develop any further worsening symptoms or strokelike symptoms then please return to the ED.

## 2021-03-19 NOTE — Progress Notes (Signed)
°   03/19/21 1535  Clinical Encounter Type  Visited With Patient and family together  Visit Type Initial  Referral From Other (Comment) (rounding)  Spiritual Encounters  Spiritual Needs Prayer   Chaplain Burris offered hosptitality and non-anxious, supportive presence. Chaplain B offered prayer at Pt's request and words of encouragement.

## 2021-03-19 NOTE — ED Triage Notes (Signed)
Pt to ED for waking up with confusion, trouble getting words out, dizziness.  LKW 2200 last night.  Pt noted to have difficulty thinking of words. Ambulatory with steady gait

## 2021-03-23 DIAGNOSIS — U071 COVID-19: Secondary | ICD-10-CM | POA: Diagnosis not present

## 2021-03-23 DIAGNOSIS — J029 Acute pharyngitis, unspecified: Secondary | ICD-10-CM | POA: Diagnosis not present

## 2021-03-23 DIAGNOSIS — Z03818 Encounter for observation for suspected exposure to other biological agents ruled out: Secondary | ICD-10-CM | POA: Diagnosis not present

## 2021-03-23 DIAGNOSIS — R051 Acute cough: Secondary | ICD-10-CM | POA: Diagnosis not present

## 2021-04-10 NOTE — Progress Notes (Signed)
Name: Heather Singh   MRN: [id number]    DOB: 04/09/1945   Date:04/13/2021       Progress Note  Subjective  Chief Complaint  ED Follow Up  HPI  Episode of confusion: she woke up the morning of 01/05 with an episode of confusion , she states symptoms lasted for a few days but better by the time she went home from [facility] . She was having difficulty finding words , she had some difficulty grabbing things due to depth perception changes. Husband took her to [facility]. MRI just showed microvascular today. Discussed possible TIA  and will start low dose aspirin, continue atorvastatin. She woke up with sore throat on 01/06 followed by diarrhea and fatigue. Diagnosed with COVID-19 when she finally went to urgent care on 01/09 - she was given oral anti-viral and symptoms improved within 24 hours   Stress: she states she was very busy carrying for her aunt , she died 2021-03-26. She was staying with her prior to her death, also worries about her 12 yo son that has been sick and moved in with them two years ago. She has days when she is unable to sleep. She states she is able to talk to her husband and that is helpful. Discussed medication but she would like to try natural ways , discussed melatonin    Anemia and leucopenia: found on studies done at [facility] and we will recheck level   Hypothyroidism: she is compliant with medication, weight is stable, we will recheck TSH today   Senile Purpura: currently doing okay , however explained it may get worse with aspirin 81 mg   Patient Active Problem List   Diagnosis Date Noted   Hx of adenomatous colonic polyps 05/19/2018   Senile purpura ([place]) 04/28/2018   Closed compression fracture of L3 lumbar vertebra, sequela 08/23/2016   Atrophic vaginitis 06/05/2015   Acquired hypothyroidism 10/19/2014   Perennial allergic rhinitis 10/19/2014   B12 deficiency 10/19/2014   Fibrocystic breast disease 10/19/2014   Gastro-esophageal reflux disease without esophagitis 10/19/2014    OP (osteoporosis) 10/19/2014   Ovarian failure 10/19/2014   Hiatal hernia 10/19/2014   Restless legs syndrome 10/19/2014   Dyslipidemia 08/23/2008   Hyperlipidemia 08/23/2008   Vitamin D deficiency 10/10/2007    Past Surgical History:  Procedure Laterality Date   BREAST CYST ASPIRATION Left    BREAST EXCISIONAL BIOPSY Left 1980's   neg   CATARACT EXTRACTION     EYE SURGERY     left   KYPHOPLASTY N/A 08/19/2016   Procedure: KYPHOPLASTY L3,L4;  Surgeon: Hessie Knows, MD;  Location: [facility] ORS;  Service: Orthopedics;  Laterality: N/A;   KYPHOPLASTY N/A 09/21/2016   Procedure: [id number];  Surgeon: Hessie Knows, MD;  Location: [facility] ORS;  Service: Orthopedics;  Laterality: N/A;   MOUTH SURGERY     POLYPECTOMY     SLING BLADE PROCEDURE     TOTAL ABDOMINAL HYSTERECTOMY      Family History  Problem Relation Age of Onset   Transient ischemic attack Mother    Atrial fibrillation Mother    Arthritis Mother        OA   Heart disease Father        CHF   CAD Father    Hyperlipidemia Sister    Arthritis Sister        OA   Drug abuse Son    Hypertension Son    Stroke Sister        Lived in a nursing  home and died from COVID-5   Arthritis Sister        OA   Heart failure Other    Coronary artery disease Other    Stroke Other    Hyperlipidemia Other    Hypertension Other    Thyroid disease Other    Breast cancer Maternal Aunt    Breast cancer Paternal Aunt     Social History   Tobacco Use   Smoking status: Never   Smokeless tobacco: Never  Substance Use Topics   Alcohol use: No    Alcohol/week: 0.0 standard drinks     Current Outpatient Medications:    atorvastatin (LIPITOR) 40 MG tablet, Take 1 tablet (40 mg total) by mouth daily., Disp: 90 tablet, Rfl: 1   calcium carbonate (TUMS EX) 750 MG chewable tablet, Chew 1 tablet by mouth daily., Disp: , Rfl:    Cholecalciferol (VITAMIN D) 2000 units CAPS, Take 1 capsule by mouth daily., Disp: , Rfl:    denosumab  (PROLIA) 60 MG/ML SOSY injection, Inject 60 mg into the skin every 6 (six) months., Disp: , Rfl:    fexofenadine (ALLEGRA ALLERGY) 180 MG tablet, Take 1 tablet (180 mg total) by mouth daily., Disp: 90 tablet, Rfl: 0   levothyroxine (SYNTHROID) 25 MCG tablet, Take 1-2 tablets (25-50 mcg total) by mouth daily before breakfast. Alternating every other day, Disp: 135 tablet, Rfl: 1   Magnesium 400 MG TABS, Take 1 tablet by mouth daily., Disp: , Rfl:    omeprazole (PRILOSEC) 40 MG capsule, Take 1 capsule (40 mg total) by mouth daily., Disp: 90 capsule, Rfl: 1   vitamin B-12 (CYANOCOBALAMIN) 1000 MCG tablet, Take 1,000 mcg by mouth 3 (three) times a week., Disp: , Rfl:   Allergies  Allergen Reactions   Alendronate     Dyspepsia, inflamed esophagus    Levofloxacin Hives   Nitrofurantoin Monohyd Macro Hives   Sulfamethoxazole-Trimethoprim Hives   Etodolac Hives   Penicillins Hives and Rash    Has patient had a PCN reaction causing immediate rash, facial/tongue/throat swelling, SOB or lightheadedness with hypotension: No Has patient had a PCN reaction causing severe rash involving mucus membranes or skin necrosis: No Has patient had a PCN reaction that required hospitalization: No Has patient had a PCN reaction occurring within the last 10 years: No If all of the above answers are "NO", then may proceed with Cephalosporin use.     I personally reviewed active problem list, medication list, allergies, family history, social history, health maintenance with the patient/caregiver today.   ROS  Constitutional: Negative for fever or weight change.  Respiratory: Negative for cough and shortness of breath.   Cardiovascular: Negative for chest pain or palpitations.  Gastrointestinal: Negative for abdominal pain, no bowel changes.  Musculoskeletal: Negative for gait problem or joint swelling.  Skin: Negative for rash.  Neurological: Negative for dizziness or headache.  No other specific complaints  in a complete review of systems (except as listed in HPI above).   Objective  Vitals:   04/13/21 0903  BP: 132/70  Pulse: 81  Resp: 16  SpO2: 95%  Weight: 103 lb (46.7 kg)  Height: 5\' 3"  (1.6 m)    Body mass index is 18.25 kg/m.  Physical Exam  Constitutional: Patient appears well-developed and well-nourished.  No distress.  HEENT: head atraumatic, normocephalic, pupils equal and reactive to light,  neck supple Cardiovascular: Normal rate, regular rhythm and normal heart sounds.  No murmur heard. No BLE edema. Pulmonary/Chest: Effort normal and  breath sounds normal. No respiratory distress. Abdominal: Soft.  There is no tenderness. Psychiatric: Patient has a normal mood and affect. behavior is normal. Judgment and thought content normal.   Recent Results (from the past 2160 hour(s))  Protime-INR     Status: Abnormal   Collection Time: 03/19/21  7:57 AM  Result Value Ref Range   Prothrombin Time 11.2 (L) 11.4 - 15.2 seconds   INR 0.8 0.8 - 1.2    Comment: (NOTE) INR goal varies based on device and disease states. Performed at [facility], [address] [place], [place] [address]   APTT     Status: None   Collection Time: 03/19/21  7:57 AM  Result Value Ref Range   aPTT 27 24 - 36 seconds    Comment: Performed at [facility], [address] [place], [place] [address]  CBC     Status: Abnormal   Collection Time: 03/19/21  7:57 AM  Result Value Ref Range   WBC 3.8 (L) 4.0 - 10.5 K/uL   RBC 3.78 (L) 3.87 - 5.11 MIL/uL   Hemoglobin 11.9 (L) 12.0 - 15.0 g/dL   HCT 36.5 36.0 - 46.0 %   MCV 96.6 80.0 - 100.0 fL   MCH 31.5 26.0 - 34.0 pg   MCHC 32.6 30.0 - 36.0 g/dL   RDW 12.5 11.5 - 15.5 %   Platelets 234 150 - 400 K/uL   nRBC 0.0 0.0 - 0.2 %    Comment: Performed at [facility], [address] [place], [place] [address]  Differential     Status: None   Collection Time: 03/19/21  7:57 AM  Result Value Ref Range   Neutrophils  Relative % 47 %   Neutro Abs 1.8 1.7 - 7.7 K/uL   Lymphocytes Relative 40 %   Lymphs Abs 1.5 0.7 - 4.0 K/uL   Monocytes Relative 10 %   Monocytes Absolute 0.4 0.1 - 1.0 K/uL   Eosinophils Relative 2 %   Eosinophils Absolute 0.1 0.0 - 0.5 K/uL   Basophils Relative 1 %   Basophils Absolute 0.0 0.0 - 0.1 K/uL   Immature Granulocytes 0 %   Abs Immature Granulocytes 0.01 0.00 - 0.07 K/uL    Comment: Performed at [facility], [address] [place], [place] [address]  Comprehensive metabolic panel     Status: None   Collection Time: 03/19/21  7:57 AM  Result Value Ref Range   Sodium 139 135 - 145 mmol/L   Potassium 3.9 3.5 - 5.1 mmol/L   Chloride 100 98 - 111 mmol/L   CO2 31 22 - 32 mmol/L   Glucose, Bld 95 70 - 99 mg/dL    Comment: Glucose reference range applies only to samples taken after fasting for at least 8 hours.   BUN 18 8 - 23 mg/dL   Creatinine, Ser 0.69 0.44 - 1.00 mg/dL   Calcium 9.4 8.9 - 10.3 mg/dL   Total Protein 6.9 6.5 - 8.1 g/dL   Albumin 4.1 3.5 - 5.0 g/dL   AST 23 15 - 41 U/L   ALT 15 0 - 44 U/L   Alkaline Phosphatase 42 38 - 126 U/L   Total Bilirubin 0.8 0.3 - 1.2 mg/dL   GFR, Estimated >60 >60 mL/min    Comment: (NOTE) Calculated using the CKD-EPI Creatinine Equation (2021)    Anion gap 8 5 - 15    Comment: Performed at [facility], [address] [place], [place]  [address]  Resp Panel by RT-PCR (Flu A&B, Covid) Nasopharyngeal Swab     Status: None   Collection Time: 03/19/21  7:40 PM   Specimen: Nasopharyngeal Swab; Nasopharyngeal(NP) swabs in vial transport medium  Result Value Ref Range   SARS Coronavirus 2 by RT PCR NEGATIVE NEGATIVE    Comment: (NOTE) SARS-CoV-2 target nucleic acids are NOT DETECTED.  The SARS-CoV-2 RNA is generally detectable in upper respiratory specimens during the acute phase of infection. The lowest concentration of SARS-CoV-2 viral copies this assay can detect is 138 copies/mL. A negative result does  not preclude SARS-Cov-2 infection and should not be used as the sole basis for treatment or other patient management decisions. A negative result may occur with  improper specimen collection/handling, submission of specimen other than nasopharyngeal swab, presence of viral mutation(s) within the areas targeted by this assay, and inadequate number of viral copies(<138 copies/mL). A negative result must be combined with clinical observations, patient history, and epidemiological information. The expected result is Negative.  Fact Sheet for Patients:  EntrepreneurPulse.com.au  Fact Sheet for Healthcare Providers:  IncredibleEmployment.be  This test is no t yet approved or cleared by the [place] FDA and  has been authorized for detection and/or diagnosis of SARS-CoV-2 by FDA under an Emergency Use Authorization (EUA). This EUA will remain  in effect (meaning this test can be used) for the duration of the COVID-19 declaration under Section 564(b)(1) of the Act, 21 U.S.C.section 360bbb-3(b)(1), unless the authorization is terminated  or revoked sooner.       Influenza A by PCR NEGATIVE NEGATIVE   Influenza B by PCR NEGATIVE NEGATIVE    Comment: (NOTE) The Xpert Xpress SARS-CoV-2/FLU/RSV plus assay is intended as an aid in the diagnosis of influenza from Nasopharyngeal swab specimens and should not be used as a sole basis for treatment. Nasal washings and aspirates are unacceptable for Xpert Xpress SARS-CoV-2/FLU/RSV testing.  Fact Sheet for Patients: EntrepreneurPulse.com.au  Fact Sheet for Healthcare Providers: IncredibleEmployment.be  This test is not yet approved or cleared by the [place] FDA and has been authorized for detection and/or diagnosis of SARS-CoV-2 by FDA under an Emergency Use Authorization (EUA). This EUA will remain in effect (meaning this test can be used) for the duration of  the COVID-19 declaration under Section 564(b)(1) of the Act, 21 U.S.C. section 360bbb-3(b)(1), unless the authorization is terminated or revoked.  Performed at [facility], [address] [place], [place] [address]   Urinalysis, Routine w reflex microscopic     Status: Abnormal   Collection Time: 03/19/21  8:24 PM  Result Value Ref Range   Color, Urine YELLOW (A) YELLOW   APPearance CLEAR (A) CLEAR   Specific Gravity, Urine 1.033 (H) 1.005 - 1.030   pH 7.0 5.0 - 8.0   Glucose, UA NEGATIVE NEGATIVE mg/dL   Hgb urine dipstick SMALL (A) NEGATIVE   Bilirubin Urine NEGATIVE NEGATIVE   Ketones, ur 20 (A) NEGATIVE mg/dL   Protein, ur NEGATIVE NEGATIVE mg/dL   Nitrite NEGATIVE NEGATIVE   Leukocytes,Ua TRACE (A) NEGATIVE   RBC / HPF 0-5 0 - 5 RBC/hpf   WBC, UA 0-5 0 - 5 WBC/hpf   Bacteria, UA NONE SEEN NONE SEEN   Squamous Epithelial / LPF 0-5 0 - 5   Mucus PRESENT     Comment: Performed at [facility], [address], [place], [place] [address]     PHQ2/9: Depression screen [facility] 2/9 04/13/2021 11/04/2020 10/23/2020 05/15/2020 05/05/2020  Decreased Interest  0 0 0 0 0  Down, Depressed, Hopeless 0 0 0 0 0  PHQ - 2 Score 0 0 0 0 0  Altered sleeping 0 - - - -  Tired, decreased energy 0 - - - -  Change in appetite 0 - - - -  Feeling bad or failure about yourself  0 - - - -  Trouble concentrating 0 - - - -  Moving slowly or fidgety/restless 0 - - - -  Suicidal thoughts 0 - - - -  PHQ-9 Score 0 - - - -  Difficult doing work/chores - - - - -    phq 9 is negative   Fall Risk: Fall Risk  04/13/2021 11/04/2020 10/23/2020 05/15/2020 05/05/2020  Falls in the past year? 0 0 0 0 0  Number falls in past yr: 0 0 - 0 0  Injury with Fall? 0 0 - 0 0  Risk for fall due to : No Fall Risks No Fall Risks - No Fall Risks -  Follow up Falls prevention discussed Falls prevention discussed Falls prevention discussed Falls prevention discussed -      Functional Status Survey: Is the  patient deaf or have difficulty hearing?: Yes Does the patient have difficulty seeing, even when wearing glasses/contacts?: No Does the patient have difficulty concentrating, remembering, or making decisions?: No Does the patient have difficulty walking or climbing stairs?: No Does the patient have difficulty dressing or bathing?: No Does the patient have difficulty doing errands alone such as visiting a doctor's office or shopping?: No    Assessment & Plan  1. History of confusion  resolved  2. Cerebral microvascular disease  - aspirin 81 MG EC tablet; Take 1 tablet (81 mg total) by mouth daily. Swallow whole.  Dispense: 30 tablet; Refill: 12  3. Anemia, unspecified type  - CBC with Differential/Platelet - Iron, TIBC and Ferritin Panel  4. Other neutropenia (HCC)  - CBC with Differential/Platelet  5. Dyslipidemia  - Lipid panel  6. B12 deficiency  - Vitamin B12  7. Acquired hypothyroidism  - TSH  8. Senile purpura ([place])

## 2021-04-13 ENCOUNTER — Encounter: Payer: Self-pay | Admitting: Family Medicine

## 2021-04-13 ENCOUNTER — Ambulatory Visit (INDEPENDENT_AMBULATORY_CARE_PROVIDER_SITE_OTHER): Payer: Medicare Other | Admitting: Family Medicine

## 2021-04-13 VITALS — BP 132/70 | HR 81 | Resp 16 | Ht 63.0 in | Wt 103.0 lb

## 2021-04-13 DIAGNOSIS — E039 Hypothyroidism, unspecified: Secondary | ICD-10-CM | POA: Diagnosis not present

## 2021-04-13 DIAGNOSIS — D649 Anemia, unspecified: Secondary | ICD-10-CM | POA: Diagnosis not present

## 2021-04-13 DIAGNOSIS — D708 Other neutropenia: Secondary | ICD-10-CM

## 2021-04-13 DIAGNOSIS — Z87898 Personal history of other specified conditions: Secondary | ICD-10-CM

## 2021-04-13 DIAGNOSIS — E785 Hyperlipidemia, unspecified: Secondary | ICD-10-CM

## 2021-04-13 DIAGNOSIS — I6789 Other cerebrovascular disease: Secondary | ICD-10-CM | POA: Diagnosis not present

## 2021-04-13 DIAGNOSIS — D692 Other nonthrombocytopenic purpura: Secondary | ICD-10-CM

## 2021-04-13 DIAGNOSIS — E538 Deficiency of other specified B group vitamins: Secondary | ICD-10-CM

## 2021-04-13 LAB — CBC WITH DIFFERENTIAL/PLATELET
Absolute Monocytes: 328 cells/uL (ref 200–950)
Basophils Absolute: 10 cells/uL (ref 0–200)
Basophils Relative: 0.2 %
Eosinophils Absolute: 69 cells/uL (ref 15–500)
Eosinophils Relative: 1.4 %
HCT: 35 % (ref 35.0–45.0)
Hemoglobin: 11.8 g/dL (ref 11.7–15.5)
Lymphs Abs: 1450 cells/uL (ref 850–3900)
MCH: 32.3 pg (ref 27.0–33.0)
MCHC: 33.7 g/dL (ref 32.0–36.0)
MCV: 95.9 fL (ref 80.0–100.0)
MPV: 11.3 fL (ref 7.5–12.5)
Monocytes Relative: 6.7 %
Neutro Abs: 3043 cells/uL (ref 1500–7800)
Neutrophils Relative %: 62.1 %
Platelets: 231 10*3/uL (ref 140–400)
RBC: 3.65 10*6/uL — ABNORMAL LOW (ref 3.80–5.10)
RDW: 11.9 % (ref 11.0–15.0)
Total Lymphocyte: 29.6 %
WBC: 4.9 10*3/uL (ref 3.8–10.8)

## 2021-04-13 LAB — IRON,TIBC AND FERRITIN PANEL
%SAT: 33 % (calc) (ref 16–45)
Ferritin: 39 ng/mL (ref 16–288)
Iron: 114 ug/dL (ref 45–160)
TIBC: 344 mcg/dL (calc) (ref 250–450)

## 2021-04-13 LAB — LIPID PANEL
Cholesterol: 171 mg/dL (ref <200)
HDL: 84 mg/dL (ref >=50)
LDL Cholesterol (Calc): 73 mg/dL (calc)
Non-HDL Cholesterol (Calc): 87 mg/dL (calc) (ref <130)
Total CHOL/HDL Ratio: 2 (calc) (ref <5.0)
Triglycerides: 65 mg/dL (ref <150)

## 2021-04-13 LAB — VITAMIN B12: Vitamin B-12: 609 pg/mL (ref 200–1100)

## 2021-04-13 LAB — TSH: TSH: 1.81 mIU/L (ref 0.40–4.50)

## 2021-04-13 MED ORDER — ASPIRIN 81 MG PO TBEC: 81.0000 mg | DELAYED_RELEASE_TABLET | Freq: Every day | ORAL | 12 refills | Status: AC

## 2021-05-07 NOTE — Progress Notes (Signed)
Name: Heather Singh   MRN: [id number]    DOB: 23-Dec-1945   Date:05/11/2021       Progress Note  Subjective  Chief Complaint  Follow Up  HPI  Episode of confusion: she woke up the morning of 01/05 with an episode of confusion , she states symptoms lasted for a few days but better by the time she went home from [facility] . She was having difficulty finding words , she had some difficulty grabbing things due to depth perception changes. Husband took her to [facility]. MRI just showed microvascular today. Discussed possible TIA  and will start low dose aspirin, continue atorvastatin. She woke up with sore throat on 01/06 followed by diarrhea and fatigue. Diagnosed with COVID-19 when she finally went to urgent care on 01/09 - she was given oral anti-viral and symptoms improved within 24 hours . No episodes of confusion since last visit.   Stress: she states she was very busy carrying for her aunt , she died Mar 25, 2021. She was staying with her prior to her death, also worries about her 5 yo son that has been sick and moved in with them two years ago, there is always someone at their house to help him out, also having to help him financially.  She states feeling better , insomnia improved, only occasionally has problems sleeping now. Husband is worried about her because years ago she had an episode of feeling overwhelmed that triggered some jerking movements and mental fogginess and felt sad, she was treated for RLS. She is non confrontational .  Discussed mindfulness, therapy and or medication . She will try mindfulness and consider lexapro, she states sometimes she just wants everything to be over.      Patient Active Problem List   Diagnosis Date Noted   Cerebral microvascular disease 04/13/2021   Hx of adenomatous colonic polyps 05/19/2018   Senile purpura ([place]) 04/28/2018   Closed compression fracture of L3 lumbar vertebra, sequela 08/23/2016   Atrophic vaginitis 06/05/2015   Acquired hypothyroidism  10/19/2014   Perennial allergic rhinitis 10/19/2014   B12 deficiency 10/19/2014   Fibrocystic breast disease 10/19/2014   Gastro-esophageal reflux disease without esophagitis 10/19/2014   OP (osteoporosis) 10/19/2014   Ovarian failure 10/19/2014   Hiatal hernia 10/19/2014   Restless legs syndrome 10/19/2014   Dyslipidemia 08/23/2008   Hyperlipidemia 08/23/2008   Vitamin D deficiency 10/10/2007    Past Surgical History:  Procedure Laterality Date   BREAST CYST ASPIRATION Left    BREAST EXCISIONAL BIOPSY Left 1980's   neg   CATARACT EXTRACTION     EYE SURGERY     left   KYPHOPLASTY N/A 08/19/2016   Procedure: KYPHOPLASTY L3,L4;  Surgeon: Hessie Knows, MD;  Location: [facility] ORS;  Service: Orthopedics;  Laterality: N/A;   KYPHOPLASTY N/A 09/21/2016   Procedure: [id number];  Surgeon: Hessie Knows, MD;  Location: [facility] ORS;  Service: Orthopedics;  Laterality: N/A;   MOUTH SURGERY     POLYPECTOMY     SLING BLADE PROCEDURE     TOTAL ABDOMINAL HYSTERECTOMY      Family History  Problem Relation Age of Onset   Transient ischemic attack Mother    Atrial fibrillation Mother    Arthritis Mother        OA   Heart disease Father        CHF   CAD Father    Hyperlipidemia Sister    Arthritis Sister        OA   Drug abuse Son  Hypertension Son    Stroke Sister        Lived in a nursing home and died from COVID-84   Arthritis Sister        OA   Heart failure Other    Coronary artery disease Other    Stroke Other    Hyperlipidemia Other    Hypertension Other    Thyroid disease Other    Breast cancer Maternal Aunt    Breast cancer Paternal Aunt     Social History   Tobacco Use   Smoking status: Never   Smokeless tobacco: Never  Substance Use Topics   Alcohol use: No    Alcohol/week: 0.0 standard drinks     Current Outpatient Medications:    aspirin 81 MG EC tablet, Take 1 tablet (81 mg total) by mouth daily. Swallow whole., Disp: 30 tablet, Rfl: 12   atorvastatin  (LIPITOR) 40 MG tablet, Take 1 tablet (40 mg total) by mouth daily., Disp: 90 tablet, Rfl: 1   calcium carbonate (TUMS EX) 750 MG chewable tablet, Chew 1 tablet by mouth daily., Disp: , Rfl:    Cholecalciferol (VITAMIN D) 2000 units CAPS, Take 1 capsule by mouth daily., Disp: , Rfl:    denosumab (PROLIA) 60 MG/ML SOSY injection, Inject 60 mg into the skin every 6 (six) months., Disp: , Rfl:    fexofenadine (ALLEGRA ALLERGY) 180 MG tablet, Take 1 tablet (180 mg total) by mouth daily., Disp: 90 tablet, Rfl: 0   levothyroxine (SYNTHROID) 25 MCG tablet, Take 1-2 tablets (25-50 mcg total) by mouth daily before breakfast. Alternating every other day, Disp: 135 tablet, Rfl: 1   Magnesium 400 MG TABS, Take 1 tablet by mouth daily., Disp: , Rfl:    omeprazole (PRILOSEC) 40 MG capsule, Take 1 capsule (40 mg total) by mouth daily., Disp: 90 capsule, Rfl: 1   vitamin B-12 (CYANOCOBALAMIN) 1000 MCG tablet, Take 1,000 mcg by mouth 3 (three) times a week., Disp: , Rfl:   Allergies  Allergen Reactions   Alendronate     Dyspepsia, inflamed esophagus    Levofloxacin Hives   Nitrofurantoin Monohyd Macro Hives   Sulfamethoxazole-Trimethoprim Hives   Etodolac Hives   Penicillins Hives and Rash    Has patient had a PCN reaction causing immediate rash, facial/tongue/throat swelling, SOB or lightheadedness with hypotension: No Has patient had a PCN reaction causing severe rash involving mucus membranes or skin necrosis: No Has patient had a PCN reaction that required hospitalization: No Has patient had a PCN reaction occurring within the last 10 years: No If all of the above answers are "NO", then may proceed with Cephalosporin use.     I personally reviewed active problem list, medication list, allergies, family history, social history, health maintenance with the patient/caregiver today.   ROS  Ten systems reviewed and is negative except as mentioned in HPI   Objective  Vitals:   05/11/21 1344  BP:  122/68  Pulse: 82  Resp: 16  SpO2: 98%  Weight: 103 lb (46.7 kg)  Height: 5\' 1"  (1.549 m)    Body mass index is 19.46 kg/m.  Physical Exam  Constitutional: Patient appears well-developed and well-nourished  No distress.  HEENT: head atraumatic, normocephalic, neck supple Cardiovascular: Normal rate, regular rhythm and normal heart sounds.  No murmur heard. No BLE edema. Pulmonary/Chest: Effort normal and breath sounds normal. No respiratory distress. Abdominal: Soft.  There is no tenderness. Psychiatric: Patient has a normal mood and affect. behavior is normal. Judgment and thought  content normal.   Recent Results (from the past 2160 hour(s))  Protime-INR     Status: Abnormal   Collection Time: 03/19/21  7:57 AM  Result Value Ref Range   Prothrombin Time 11.2 (L) 11.4 - 15.2 seconds   INR 0.8 0.8 - 1.2    Comment: (NOTE) INR goal varies based on device and disease states. Performed at [facility], [address] [place], [place] [address]   APTT     Status: None   Collection Time: 03/19/21  7:57 AM  Result Value Ref Range   aPTT 27 24 - 36 seconds    Comment: Performed at [facility], [address] [place], [place] [address]  CBC     Status: Abnormal   Collection Time: 03/19/21  7:57 AM  Result Value Ref Range   WBC 3.8 (L) 4.0 - 10.5 K/uL   RBC 3.78 (L) 3.87 - 5.11 MIL/uL   Hemoglobin 11.9 (L) 12.0 - 15.0 g/dL   HCT 36.5 36.0 - 46.0 %   MCV 96.6 80.0 - 100.0 fL   MCH 31.5 26.0 - 34.0 pg   MCHC 32.6 30.0 - 36.0 g/dL   RDW 12.5 11.5 - 15.5 %   Platelets 234 150 - 400 K/uL   nRBC 0.0 0.0 - 0.2 %    Comment: Performed at [facility], [address] [place], [place] [address]  Differential     Status: None   Collection Time: 03/19/21  7:57 AM  Result Value Ref Range   Neutrophils Relative % 47 %   Neutro Abs 1.8 1.7 - 7.7 K/uL   Lymphocytes Relative 40 %   Lymphs Abs 1.5 0.7 - 4.0 K/uL   Monocytes Relative 10 %   Monocytes  Absolute 0.4 0.1 - 1.0 K/uL   Eosinophils Relative 2 %   Eosinophils Absolute 0.1 0.0 - 0.5 K/uL   Basophils Relative 1 %   Basophils Absolute 0.0 0.0 - 0.1 K/uL   Immature Granulocytes 0 %   Abs Immature Granulocytes 0.01 0.00 - 0.07 K/uL    Comment: Performed at [facility], [address] [place], [place] [address]  Comprehensive metabolic panel     Status: None   Collection Time: 03/19/21  7:57 AM  Result Value Ref Range   Sodium 139 135 - 145 mmol/L   Potassium 3.9 3.5 - 5.1 mmol/L   Chloride 100 98 - 111 mmol/L   CO2 31 22 - 32 mmol/L   Glucose, Bld 95 70 - 99 mg/dL    Comment: Glucose reference range applies only to samples taken after fasting for at least 8 hours.   BUN 18 8 - 23 mg/dL   Creatinine, Ser 0.69 0.44 - 1.00 mg/dL   Calcium 9.4 8.9 - 10.3 mg/dL   Total Protein 6.9 6.5 - 8.1 g/dL   Albumin 4.1 3.5 - 5.0 g/dL   AST 23 15 - 41 U/L   ALT 15 0 - 44 U/L   Alkaline Phosphatase 42 38 - 126 U/L   Total Bilirubin 0.8 0.3 - 1.2 mg/dL   GFR, Estimated >60 >60 mL/min    Comment: (NOTE) Calculated using the CKD-EPI Creatinine Equation (2021)    Anion gap 8 5 - 15    Comment: Performed at [facility], [address] [place],  [address]  Resp Panel by RT-PCR (Flu A&B, Covid) Nasopharyngeal Swab     Status: None   Collection Time: 03/19/21  7:40 PM  Specimen: Nasopharyngeal Swab; Nasopharyngeal(NP) swabs in vial transport medium  Result Value Ref Range   SARS Coronavirus 2 by RT PCR NEGATIVE NEGATIVE    Comment: (NOTE) SARS-CoV-2 target nucleic acids are NOT DETECTED.  The SARS-CoV-2 RNA is generally detectable in upper respiratory specimens during the acute phase of infection. The lowest concentration of SARS-CoV-2 viral copies this assay can detect is 138 copies/mL. A negative result does not preclude SARS-Cov-2 infection and should not be used as the sole basis for treatment or other patient management decisions. A negative result may  occur with  improper specimen collection/handling, submission of specimen other than nasopharyngeal swab, presence of viral mutation(s) within the areas targeted by this assay, and inadequate number of viral copies(<138 copies/mL). A negative result must be combined with clinical observations, patient history, and epidemiological information. The expected result is Negative.  Fact Sheet for Patients:  EntrepreneurPulse.com.au  Fact Sheet for Healthcare Providers:  IncredibleEmployment.be  This test is no t yet approved or cleared by the [place] FDA and  has been authorized for detection and/or diagnosis of SARS-CoV-2 by FDA under an Emergency Use Authorization (EUA). This EUA will remain  in effect (meaning this test can be used) for the duration of the COVID-19 declaration under Section 564(b)(1) of the Act, 21 U.S.C.section 360bbb-3(b)(1), unless the authorization is terminated  or revoked sooner.       Influenza A by PCR NEGATIVE NEGATIVE   Influenza B by PCR NEGATIVE NEGATIVE    Comment: (NOTE) The Xpert Xpress SARS-CoV-2/FLU/RSV plus assay is intended as an aid in the diagnosis of influenza from Nasopharyngeal swab specimens and should not be used as a sole basis for treatment. Nasal washings and aspirates are unacceptable for Xpert Xpress SARS-CoV-2/FLU/RSV testing.  Fact Sheet for Patients: EntrepreneurPulse.com.au  Fact Sheet for Healthcare Providers: IncredibleEmployment.be  This test is not yet approved or cleared by the [place] FDA and has been authorized for detection and/or diagnosis of SARS-CoV-2 by FDA under an Emergency Use Authorization (EUA). This EUA will remain in effect (meaning this test can be used) for the duration of the COVID-19 declaration under Section 564(b)(1) of the Act, 21 U.S.C. section 360bbb-3(b)(1), unless the authorization is terminated  or revoked.  Performed at [facility], [address] [place], [place] [address]   Urinalysis, Routine w reflex microscopic     Status: Abnormal   Collection Time: 03/19/21  8:24 PM  Result Value Ref Range   Color, Urine YELLOW (A) YELLOW   APPearance CLEAR (A) CLEAR   Specific Gravity, Urine 1.033 (H) 1.005 - 1.030   pH 7.0 5.0 - 8.0   Glucose, UA NEGATIVE NEGATIVE mg/dL   Hgb urine dipstick SMALL (A) NEGATIVE   Bilirubin Urine NEGATIVE NEGATIVE   Ketones, ur 20 (A) NEGATIVE mg/dL   Protein, ur NEGATIVE NEGATIVE mg/dL   Nitrite NEGATIVE NEGATIVE   Leukocytes,Ua TRACE (A) NEGATIVE   RBC / HPF 0-5 0 - 5 RBC/hpf   WBC, UA 0-5 0 - 5 WBC/hpf   Bacteria, UA NONE SEEN NONE SEEN   Squamous Epithelial / LPF 0-5 0 - 5   Mucus PRESENT     Comment: Performed at [facility], [address] [place], [place] [address]  Lipid panel     Status: None   Collection Time: 04/13/21  9:58 AM  Result Value Ref Range   Cholesterol 171 <200 mg/dL   HDL 84 > OR = 50 mg/dL   Triglycerides 65 <150 mg/dL  LDL Cholesterol (Calc) 73 mg/dL (calc)    Comment: Reference range: <100 . Desirable range <100 mg/dL for primary prevention;   <70 mg/dL for patients with CHD or diabetic patients  with > or = 2 CHD risk factors. Marland Kitchen LDL-C is now calculated using the Martin-Hopkins  calculation, which is a validated novel method providing  better accuracy than the Friedewald equation in the  estimation of LDL-C.  Cresenciano Genre et al. Annamaria Helling. 0938;182(99): 2061-2068  (http://education.[facility].com/faq/FAQ164)    Total CHOL/HDL Ratio 2.0 <5.0 (calc)   Non-HDL Cholesterol (Calc) 87 <130 mg/dL (calc)    Comment: For patients with diabetes plus 1 major ASCVD risk  factor, treating to a non-HDL-C goal of <100 mg/dL  (LDL-C of <70 mg/dL) is considered a therapeutic  option.   TSH     Status: None   Collection Time: 04/13/21  9:58 AM  Result Value Ref Range   TSH 1.81 0.40 - 4.50 mIU/L   CBC with Differential/Platelet     Status: Abnormal   Collection Time: 04/13/21  9:58 AM  Result Value Ref Range   WBC 4.9 3.8 - 10.8 Thousand/uL   RBC 3.65 (L) 3.80 - 5.10 Million/uL   Hemoglobin 11.8 11.7 - 15.5 g/dL   HCT 35.0 35.0 - 45.0 %   MCV 95.9 80.0 - 100.0 fL   MCH 32.3 27.0 - 33.0 pg   MCHC 33.7 32.0 - 36.0 g/dL   RDW 11.9 11.0 - 15.0 %   Platelets 231 140 - 400 Thousand/uL   MPV 11.3 7.5 - 12.5 fL   Neutro Abs 3,043 1,500 - 7,800 cells/uL   Lymphs Abs 1,450 850 - 3,900 cells/uL   Absolute Monocytes 328 200 - 950 cells/uL   Eosinophils Absolute 69 15 - 500 cells/uL   Basophils Absolute 10 0 - 200 cells/uL   Neutrophils Relative % 62.1 %   Total Lymphocyte 29.6 %   Monocytes Relative 6.7 %   Eosinophils Relative 1.4 %   Basophils Relative 0.2 %  Vitamin B12     Status: None   Collection Time: 04/13/21  9:58 AM  Result Value Ref Range   Vitamin B-12 609 200 - 1,100 pg/mL  Iron, TIBC and Ferritin Panel     Status: None   Collection Time: 04/13/21  9:58 AM  Result Value Ref Range   Iron 114 45 - 160 mcg/dL   TIBC 344 250 - 450 mcg/dL (calc)   %SAT 33 16 - 45 % (calc)   Ferritin 39 16 - 288 ng/mL     PHQ2/9: Depression screen [facility] 2/9 05/11/2021 04/13/2021 11/04/2020 10/23/2020 05/15/2020  Decreased Interest 0 0 0 0 0  Down, Depressed, Hopeless 0 0 0 0 0  PHQ - 2 Score 0 0 0 0 0  Altered sleeping 0 0 - - -  Tired, decreased energy 0 0 - - -  Change in appetite 0 0 - - -  Feeling bad or failure about yourself  0 0 - - -  Trouble concentrating 0 0 - - -  Moving slowly or fidgety/restless 0 0 - - -  Suicidal thoughts 0 0 - - -  PHQ-9 Score 0 0 - - -  Difficult doing work/chores - - - - -  Some recent data might be hidden    phq 9 is negative   Fall Risk: Fall Risk  05/11/2021 04/13/2021 11/04/2020 10/23/2020 05/15/2020  Falls in the past year? 0 0 0 0 0  Number falls in past yr:  0 0 0 - 0  Injury with Fall? 0 0 0 - 0  Risk for fall due to : No Fall Risks No Fall  Risks No Fall Risks - No Fall Risks  Follow up Falls prevention discussed Falls prevention discussed Falls prevention discussed Falls prevention discussed Falls prevention discussed      Functional Status Survey: Is the patient deaf or have difficulty hearing?: Yes Does the patient have difficulty seeing, even when wearing glasses/contacts?: No Does the patient have difficulty concentrating, remembering, or making decisions?: No Does the patient have difficulty walking or climbing stairs?: No Does the patient have difficulty dressing or bathing?: No Does the patient have difficulty doing errands alone such as visiting a doctor's office or shopping?: No    Assessment & Plan  1. Dysthymia  - escitalopram (LEXAPRO) 5 MG tablet; Take 1 tablet (5 mg total) by mouth daily.  Dispense: 30 tablet; Refill: 1   Discussed mindfulness, love and kindness meditation, walking meditation

## 2021-05-08 ENCOUNTER — Ambulatory Visit: Payer: Medicare Other | Admitting: Family Medicine

## 2021-05-11 ENCOUNTER — Ambulatory Visit: Payer: Medicare Other | Admitting: Family Medicine

## 2021-05-11 ENCOUNTER — Encounter: Payer: Self-pay | Admitting: Family Medicine

## 2021-05-11 ENCOUNTER — Ambulatory Visit (INDEPENDENT_AMBULATORY_CARE_PROVIDER_SITE_OTHER): Payer: Medicare Other | Admitting: Family Medicine

## 2021-05-11 VITALS — BP 122/68 | HR 82 | Resp 16 | Ht 61.0 in | Wt 103.0 lb

## 2021-05-11 DIAGNOSIS — F341 Dysthymic disorder: Secondary | ICD-10-CM | POA: Diagnosis not present

## 2021-05-11 MED ORDER — ESCITALOPRAM OXALATE 5 MG PO TABS: 5.0000 mg | ORAL_TABLET | Freq: Every day | ORAL | 1 refills | Status: DC

## 2021-05-12 ENCOUNTER — Other Ambulatory Visit: Payer: Self-pay

## 2021-05-12 ENCOUNTER — Ambulatory Visit (INDEPENDENT_AMBULATORY_CARE_PROVIDER_SITE_OTHER): Payer: Medicare Other | Admitting: Dermatology

## 2021-05-12 DIAGNOSIS — Z1283 Encounter for screening for malignant neoplasm of skin: Secondary | ICD-10-CM | POA: Diagnosis not present

## 2021-05-12 DIAGNOSIS — D229 Melanocytic nevi, unspecified: Secondary | ICD-10-CM | POA: Diagnosis not present

## 2021-05-12 DIAGNOSIS — D1800 Hemangioma unspecified site: Secondary | ICD-10-CM | POA: Diagnosis not present

## 2021-05-12 DIAGNOSIS — Z85828 Personal history of other malignant neoplasm of skin: Secondary | ICD-10-CM

## 2021-05-12 DIAGNOSIS — L578 Other skin changes due to chronic exposure to nonionizing radiation: Secondary | ICD-10-CM

## 2021-05-12 DIAGNOSIS — L820 Inflamed seborrheic keratosis: Secondary | ICD-10-CM

## 2021-05-12 DIAGNOSIS — I8392 Asymptomatic varicose veins of left lower extremity: Secondary | ICD-10-CM | POA: Diagnosis not present

## 2021-05-12 DIAGNOSIS — L821 Other seborrheic keratosis: Secondary | ICD-10-CM | POA: Diagnosis not present

## 2021-05-12 DIAGNOSIS — Z86018 Personal history of other benign neoplasm: Secondary | ICD-10-CM

## 2021-05-12 DIAGNOSIS — L814 Other melanin hyperpigmentation: Secondary | ICD-10-CM

## 2021-05-12 NOTE — Progress Notes (Signed)
Follow-Up Visit   Subjective  Heather Singh is a 76 y.o. female who presents for the following: Follow-up (Patient here today for 1 year tbse. Patient reports a spot at left leg she noticed a while ago. Patient also reports a itchy place on upper back she would like checked).  The patient presents for Total-Body Skin Exam (TBSE) for skin cancer screening and mole check.  The patient has spots, moles and lesions to be evaluated, some may be new or changing and the patient has concerns that these could be cancer.  Some itchy and bothers patient, including spot by eye, in scalp, and upper back.    The following portions of the chart were reviewed this encounter and updated as appropriate:      Review of Systems: No other skin or systemic complaints except as noted in HPI or Assessment and Plan.   Objective  Well appearing patient in no apparent distress; mood and affect are within normal limits.  A full examination was performed including scalp, head, eyes, ears, nose, lips, neck, chest, axillae, abdomen, back, buttocks, bilateral upper extremities, bilateral lower extremities, hands, feet, fingers, toes, fingernails, and toenails. All findings within normal limits unless otherwise noted below.  posterior left shoulder x 1, right lateral canthus x 1, right occipital hairline x 1 (3) Erythematous stuck-on, waxy papule   Assessment & Plan  Inflamed seborrheic keratosis (3) posterior left shoulder x 1, right lateral canthus x 1, right occipital hairline x 1    Destruction of lesion - posterior left shoulder x 1, right lateral canthus x 1, right occipital hairline x 1  Destruction method: cryotherapy   Informed consent: discussed and consent obtained   Lesion destroyed using liquid nitrogen: Yes   Region frozen until ice ball extended beyond lesion: Yes   Outcome: patient tolerated procedure well with no complications   Post-procedure details: wound care instructions given    Additional details:  Prior to procedure, discussed risks of blister formation, small wound, skin dyspigmentation, or rare scar following cryotherapy. Recommend Vaseline ointment to treated areas while healing.   Lentigines - Scattered tan macules - Due to sun exposure - Benign-appearing, observe - Recommend daily broad spectrum sunscreen SPF 30+ to sun-exposed areas, reapply every 2 hours as needed. - Call for any changes  Varicose Veins/Spider Veins - Dilated blue, purple or red veins at the lower extremities at left lower leg  - Reassured - Smaller vessels can be treated by sclerotherapy (a procedure to inject a medicine into the veins to make them disappear) if desired, but the treatment is not covered by insurance. Larger vessels may be covered if symptomatic and we would refer to vascular surgeon if treatment desired.  Seborrheic Keratoses - Stuck-on, waxy, tan-brown papules and/or plaques including left lower leg, back  - Benign-appearing - Discussed benign etiology and prognosis. - Observe - Call for any changes  Melanocytic Nevi - Tan-brown and/or pink-flesh-colored symmetric macules and papules - Benign appearing on exam today - Observation - Call clinic for new or changing moles - Recommend daily use of broad spectrum spf 30+ sunscreen to sun-exposed areas.   Hemangiomas - Red papules - Discussed benign nature - Observe - Call for any changes  Actinic Damage - Chronic condition, secondary to cumulative UV/sun exposure - diffuse scaly erythematous macules with underlying dyspigmentation - Recommend daily broad spectrum sunscreen SPF 30+ to sun-exposed areas, reapply every 2 hours as needed.  - Staying in the shade or wearing long sleeves, sun  glasses (UVA+UVB protection) and wide brim hats (4-inch brim around the entire circumference of the hat) are also recommended for sun protection.  - Call for new or changing lesions.  History of Basal Cell Carcinoma of the  Skin L brow, ~22 years ago, tx w/ MOHs - No evidence of recurrence today - Recommend regular full body skin exams - Recommend daily broad spectrum sunscreen SPF 30+ to sun-exposed areas, reapply every 2 hours as needed.  - Call if any new or changing lesions are noted between office visits   History of Dysplastic Nevi Right thigh, mild, 01/17/2009 - No evidence of recurrence today - Recommend regular full body skin exams - Recommend daily broad spectrum sunscreen SPF 30+ to sun-exposed areas, reapply every 2 hours as needed.  - Call if any new or changing lesions are noted between office visits   Skin cancer screening performed today. Return for 1 year tbse . I, Ruthell Rummage, CMA, am acting as scribe for Brendolyn Patty, MD.  Documentation: I have reviewed the above documentation for accuracy and completeness, and I agree with the above.  Brendolyn Patty MD

## 2021-05-12 NOTE — Patient Instructions (Addendum)
Seborrheic Keratosis  What causes seborrheic keratoses? Seborrheic keratoses are harmless, common skin growths that first appear during adult life.  As time goes by, more growths appear.  Some people may develop a large number of them.  Seborrheic keratoses appear on both covered and uncovered body parts.  They are not caused by sunlight.  The tendency to develop seborrheic keratoses can be inherited.  They vary in color from skin-colored to gray, brown, or even black.  They can be either smooth or have a rough, warty surface.   Seborrheic keratoses are superficial and look as if they were stuck on the skin.  Under the microscope this type of keratosis looks like layers upon layers of skin.  That is why at times the top layer may seem to fall off, but the rest of the growth remains and re-grows.    Treatment Seborrheic keratoses do not need to be treated, but can easily be removed in the office.  Seborrheic keratoses often cause symptoms when they rub on clothing or jewelry.  Lesions can be in the way of shaving.  If they become inflamed, they can cause itching, soreness, or burning.  Removal of a seborrheic keratosis can be accomplished by freezing, burning, or surgery. If any spot bleeds, scabs, or grows rapidly, please return to have it checked, as these can be an indication of a skin cancer.  Cryotherapy Aftercare  Wash gently with soap and water everyday.   Apply Vaseline and Band-Aid daily until healed.    Melanoma ABCDEs  Melanoma is the most dangerous type of skin cancer, and is the leading cause of death from skin disease.  You are more likely to develop melanoma if you: Have light-colored skin, light-colored eyes, or red or blond hair Spend a lot of time in the sun Tan regularly, either outdoors or in a tanning bed Have had blistering sunburns, especially during childhood Have a close family member who has had a melanoma Have atypical moles or large birthmarks  Early detection of  melanoma is key since treatment is typically straightforward and cure rates are extremely high if we catch it early.   The first sign of melanoma is often a change in a mole or a new dark spot.  The ABCDE system is a way of remembering the signs of melanoma.  A for asymmetry:  The two halves do not match. B for border:  The edges of the growth are irregular. C for color:  A mixture of colors are present instead of an even brown color. D for diameter:  Melanomas are usually (but not always) greater than 20mm - the size of a pencil eraser. E for evolution:  The spot keeps changing in size, shape, and color.  Please check your skin once per month between visits. You can use a small mirror in front and a large mirror behind you to keep an eye on the back side or your body.   If you see any new or changing lesions before your next follow-up, please call to schedule a visit.  Please continue daily skin protection including broad spectrum sunscreen SPF 30+ to sun-exposed areas, reapplying every 2 hours as needed when you're outdoors.   Staying in the shade or wearing long sleeves, sun glasses (UVA+UVB protection) and wide brim hats (4-inch brim around the entire circumference of the hat) are also recommended for sun protection.    If You Need Anything After Your Visit  If you have any questions or concerns for  your doctor, please call our main line at [phone] and press option 4 to reach your doctor's medical assistant. If no one answers, please leave a voicemail as directed and we will return your call as soon as possible. Messages left after 4 pm will be answered the following business day.   You may also send [place] a message via [place]. We typically respond to MyChart messages within 1-2 business days.  For prescription refills, please ask your pharmacy to contact our office. Our fax number is [phone].  If you have an urgent issue when the clinic is closed that cannot wait until the next  business day, you can page your doctor at the number below.    Please note that while we do our best to be available for urgent issues outside of office hours, we are not available 24/7.   If you have an urgent issue and are unable to reach [place], you may choose to seek medical care at your doctor's office, retail clinic, urgent care center, or emergency room.  If you have a medical emergency, please immediately call 911 or go to the emergency department.  Pager Numbers  - Dr. Nehemiah Massed: [phone]  - Dr. Laurence Ferrari: [phone]  - Dr. Nicole Kindred: [phone]  In the event of inclement weather, please call our main line at [phone] for an update on the status of any delays or closures.  Dermatology Medication Tips: Please keep the boxes that topical medications come in in order to help keep track of the instructions about where and how to use these. Pharmacies typically print the medication instructions only on the boxes and not directly on the medication tubes.   If your medication is too expensive, please contact our office at [phone] option 4 or send [place] a message through [place].   We are unable to tell what your co-pay for medications will be in advance as this is different depending on your insurance coverage. However, we may be able to find a substitute medication at lower cost or fill out paperwork to get insurance to cover a needed medication.   If a prior authorization is required to get your medication covered by your insurance company, please allow [place] 1-2 business days to complete this process.  Drug prices often vary depending on where the prescription is filled and some pharmacies may offer cheaper prices.  The website www.goodrx.com contains coupons for medications through different pharmacies. The prices here do not account for what the cost may be with help from insurance (it may be cheaper with your insurance), but the website can give you the price if you did not use  any insurance.  - You can print the associated coupon and take it with your prescription to the pharmacy.  - You may also stop by our office during regular business hours and pick up a GoodRx coupon card.  - If you need your prescription sent electronically to a different pharmacy, notify our office through [facility] or by phone at [phone] option 4.     Si Usted Necesita Algo Despus de Su Visita  Tambin puede enviarnos un mensaje a travs de Pharmacist, community. Por lo general respondemos a los mensajes de MyChart en el transcurso de 1 a 2 das hbiles.  Para renovar recetas, por favor pida a su farmacia que se ponga en contacto con nuestra oficina. Harland Dingwall de fax es [place] [phone].  Si tiene un asunto urgente cuando la clnica est cerrada y que no puede esperar Museum/gallery curator  siguiente da hbil, puede llamar/localizar a su doctor(a) al nmero que aparece a continuacin.   Por favor, tenga en cuenta que aunque hacemos todo lo posible para estar disponibles para asuntos urgentes fuera del horario de [place], no estamos disponibles las 24 horas del da, los 7 das de la [place].   Si tiene un problema urgente y no puede comunicarse con nosotros, puede optar por buscar atencin mdica  en el consultorio de su doctor(a), en una clnica privada, en un centro de atencin urgente o en una sala de emergencias.  Si tiene Engineering geologist, por favor llame inmediatamente al 911 o vaya a la sala de emergencias.  Nmeros de bper  - Dr. Nehemiah Massed: [phone]  - Dra. Moye: [phone]  - Dra. Nicole Kindred: [phone]  En caso de inclemencias del [place], por favor llame a Johnsie Kindred principal al [phone] para una actualizacin sobre el [place] de cualquier retraso o cierre.  Consejos para la medicacin en dermatologa: Por favor, guarde las cajas en las que vienen los medicamentos de uso tpico para ayudarle a seguir las instrucciones sobre dnde y cmo usarlos. Las farmacias  generalmente imprimen las instrucciones del medicamento slo en las cajas y no directamente en los tubos del [place].   Si su medicamento es muy caro, por favor, pngase en contacto con Zigmund Daniel llamando al [phone] y presione la opcin 4 o envenos un mensaje a travs de Pharmacist, community.   No podemos decirle cul ser su copago por los medicamentos por adelantado ya que esto es diferente dependiendo de la cobertura de su seguro. Sin embargo, es posible que podamos encontrar un medicamento sustituto a Electrical engineer un formulario para que el seguro cubra el medicamento que se considera necesario.   Si se requiere una autorizacin previa para que su compaa de seguros [place] su medicamento, por favor permtanos de 1 a 2 das hbiles para completar este proceso.  Los precios de los medicamentos varan con frecuencia dependiendo del Environmental consultant de dnde se surte la receta y alguna farmacias pueden ofrecer precios ms baratos.  El sitio web www.goodrx.com tiene cupones para medicamentos de Airline pilot. Los precios aqu no tienen en cuenta lo que podra costar con la ayuda del seguro (puede ser ms barato con su seguro), pero el sitio web puede darle el precio si no utiliz Research scientist (physical sciences).  - Puede imprimir el cupn correspondiente y llevarlo con su receta a la farmacia.  - Tambin puede pasar por nuestra oficina durante el horario de atencin regular y Charity fundraiser una tarjeta de cupones de GoodRx.  - Si necesita que su receta se enve electrnicamente a una farmacia diferente, informe a nuestra oficina a travs de MyChart de [place] o por telfono llamando al [phone] y presione la opcin 4.

## 2021-05-19 ENCOUNTER — Other Ambulatory Visit: Payer: Self-pay

## 2021-05-19 ENCOUNTER — Ambulatory Visit (INDEPENDENT_AMBULATORY_CARE_PROVIDER_SITE_OTHER): Payer: Medicare Other

## 2021-05-19 VITALS — BP 126/70 | HR 69 | Temp 98.3°F | Resp 16 | Ht 61.0 in | Wt 105.3 lb

## 2021-05-19 DIAGNOSIS — Z0000 Encounter for general adult medical examination without abnormal findings: Secondary | ICD-10-CM

## 2021-05-19 NOTE — Patient Instructions (Signed)
Heather Singh , Thank you for taking time to come for your Medicare Wellness Visit. I appreciate your ongoing commitment to your health goals. Please review the following plan we discussed and let me know if I can assist you in the future.   Screening recommendations/referrals: Colonoscopy: done 05/30/18. Repeat 05/2023 Mammogram: done 02/13/21 Bone Density: done 12/01/20 Recommended yearly ophthalmology/optometry visit for glaucoma screening and checkup Recommended yearly dental visit for hygiene and checkup  Vaccinations: Influenza vaccine: done 01/01/21 Pneumococcal vaccine: done 10/21/14 Tdap vaccine: done 03/30/16 Shingles vaccine: done 10/21/17 & 01/02/18   Covid-19:done 03/30/19, 04/20/19, 12/13/19 & 07/18/20  Advanced directives: Please bring a copy of your health care power of attorney and living will to the office at your convenience.   Conditions/risks identified: Keep up the great work!  Next appointment: Follow up in one year for your annual wellness visit    Preventive Care 65 Years and Older, Female Preventive care refers to lifestyle choices and visits with your health care provider that can promote health and wellness. What does preventive care include? A yearly physical exam. This is also called an annual well check. Dental exams once or twice a year. Routine eye exams. Ask your health care provider how often you should have your eyes checked. Personal lifestyle choices, including: Daily care of your teeth and gums. Regular physical activity. Eating a healthy diet. Avoiding tobacco and drug use. Limiting alcohol use. Practicing safe sex. Taking low-dose aspirin every day. Taking vitamin and mineral supplements as recommended by your health care provider. What happens during an annual well check? The services and screenings done by your health care provider during your annual well check will depend on your age, overall health, lifestyle risk factors, and family history of  disease. Counseling  Your health care provider may ask you questions about your: Alcohol use. Tobacco use. Drug use. Emotional well-being. Home and relationship well-being. Sexual activity. Eating habits. History of falls. Memory and ability to understand (cognition). Work and work Statistician. Reproductive health. Screening  You may have the following tests or measurements: Height, weight, and BMI. Blood pressure. Lipid and cholesterol levels. These may be checked every 5 years, or more frequently if you are over 61 years old. Skin check. Lung cancer screening. You may have this screening every year starting at age 23 if you have a 30-pack-year history of smoking and currently smoke or have quit within the past 15 years. Fecal occult blood test (FOBT) of the stool. You may have this test every year starting at age 9. Flexible sigmoidoscopy or colonoscopy. You may have a sigmoidoscopy every 5 years or a colonoscopy every 10 years starting at age 73. Hepatitis C blood test. Hepatitis B blood test. Sexually transmitted disease (STD) testing. Diabetes screening. This is done by checking your blood sugar (glucose) after you have not eaten for a while (fasting). You may have this done every 1-3 years. Bone density scan. This is done to screen for osteoporosis. You may have this done starting at age 30. Mammogram. This may be done every 1-2 years. Talk to your health care provider about how often you should have regular mammograms. Talk with your health care provider about your test results, treatment options, and if necessary, the need for more tests. Vaccines  Your health care provider may recommend certain vaccines, such as: Influenza vaccine. This is recommended every year. Tetanus, diphtheria, and acellular pertussis (Tdap, Td) vaccine. You may need a Td booster every 10 years. Zoster vaccine. You may  need this after age 77. Pneumococcal 13-valent conjugate (PCV13) vaccine. One  dose is recommended after age 29. Pneumococcal polysaccharide (PPSV23) vaccine. One dose is recommended after age 81. Talk to your health care provider about which screenings and vaccines you need and how often you need them. This information is not intended to replace advice given to you by your health care provider. Make sure you discuss any questions you have with your health care provider. Document Released: 03/28/2015 Document Revised: 11/19/2015 Document Reviewed: 12/31/2014 Elsevier Interactive Patient Education  2017 [place] Prevention in the Home Falls can cause injuries. They can happen to people of all ages. There are many things you can do to make your home safe and to help prevent falls. What can I do on the outside of my home? Regularly fix the edges of walkways and driveways and fix any cracks. Remove anything that might make you trip as you walk through a door, such as a raised step or threshold. Trim any bushes or trees on the path to your home. Use bright outdoor lighting. Clear any walking paths of anything that might make someone trip, such as rocks or tools. Regularly check to see if handrails are loose or broken. Make sure that both sides of any steps have handrails. Any raised decks and porches should have guardrails on the edges. Have any leaves, snow, or ice cleared regularly. Use sand or salt on walking paths during winter. Clean up any spills in your garage right away. This includes oil or grease spills. What can I do in the bathroom? Use night lights. Install grab bars by the toilet and in the tub and shower. Do not use towel bars as grab bars. Use non-skid mats or decals in the tub or shower. If you need to sit down in the shower, use a plastic, non-slip stool. Keep the floor dry. Clean up any water that spills on the floor as soon as it happens. Remove soap buildup in the tub or shower regularly. Attach bath mats securely with double-sided  non-slip rug tape. Do not have throw rugs and other things on the floor that can make you trip. What can I do in the bedroom? Use night lights. Make sure that you have a light by your bed that is easy to reach. Do not use any sheets or blankets that are too big for your bed. They should not hang down onto the floor. Have a firm chair that has side arms. You can use this for support while you get dressed. Do not have throw rugs and other things on the floor that can make you trip. What can I do in the kitchen? Clean up any spills right away. Avoid walking on wet floors. Keep items that you use a lot in easy-to-reach places. If you need to reach something above you, use a strong step stool that has a grab bar. Keep electrical cords out of the way. Do not use floor polish or wax that makes floors slippery. If you must use wax, use non-skid floor wax. Do not have throw rugs and other things on the floor that can make you trip. What can I do with my stairs? Do not leave any items on the stairs. Make sure that there are handrails on both sides of the stairs and use them. Fix handrails that are broken or loose. Make sure that handrails are as long as the stairways. Check any carpeting to make sure that it is firmly attached  to the stairs. Fix any carpet that is loose or worn. Avoid having throw rugs at the top or bottom of the stairs. If you do have throw rugs, attach them to the floor with carpet tape. Make sure that you have a light switch at the top of the stairs and the bottom of the stairs. If you do not have them, ask someone to add them for you. What else can I do to help prevent falls? Wear shoes that: Do not have high heels. Have rubber bottoms. Are comfortable and fit you well. Are closed at the toe. Do not wear sandals. If you use a stepladder: Make sure that it is fully opened. Do not climb a closed stepladder. Make sure that both sides of the stepladder are locked into place. Ask  someone to hold it for you, if possible. Clearly mark and make sure that you can see: Any grab bars or handrails. First and last steps. Where the edge of each step is. Use tools that help you move around (mobility aids) if they are needed. These include: Canes. Walkers. Scooters. Crutches. Turn on the lights when you go into a dark area. Replace any light bulbs as soon as they burn out. Set up your furniture so you have a clear path. Avoid moving your furniture around. If any of your floors are uneven, fix them. If there are any pets around you, be aware of where they are. Review your medicines with your doctor. Some medicines can make you feel dizzy. This can increase your chance of falling. Ask your doctor what other things that you can do to help prevent falls. This information is not intended to replace advice given to you by your health care provider. Make sure you discuss any questions you have with your health care provider. Document Released: 12/26/2008 Document Revised: 08/07/2015 Document Reviewed: 04/05/2014 Elsevier Interactive Patient Education  2017 [employer].

## 2021-05-19 NOTE — Progress Notes (Signed)
Subjective:   Heather Singh is a 76 y.o. female who presents for Medicare Annual (Subsequent) preventive examination.  Review of Systems     Cardiac Risk Factors include: advanced age (>35mn, >>29women);dyslipidemia     Objective:    Today's Vitals   05/19/21 1012  BP: 126/70  Pulse: 69  Resp: 16  Temp: 98.3 F (36.8 C)  TempSrc: Oral  SpO2: 98%  Weight: 105 lb 4.8 oz (47.8 kg)  Height: '5\' 1"'$  (1.549 m)   Body mass index is 19.9 kg/m.  Advanced Directives 05/19/2021 05/15/2020 05/11/2019 02/24/2019 04/28/2018 10/11/2016 09/21/2016  Does Patient Have a Medical Advance Directive? Yes Yes Yes No;Yes Yes Yes Yes  Type of AParamedicof AWintonLiving will HSturgisLiving will HDunkirkLiving will HPulpotio BareasLiving will HBig HornLiving will HOakwoodLiving will HSilver GroveLiving will  Does patient want to make changes to medical advance directive? - - - - - - -  Copy of [place]in Chart? No - copy requested Yes - validated most recent copy scanned in chart (See row information) No - copy requested - No - copy requested No - copy requested No - copy requested    Current Medications (verified) Outpatient Encounter Medications as of 05/19/2021  Medication Sig   aspirin 81 MG EC tablet Take 1 tablet (81 mg total) by mouth daily. Swallow whole.   atorvastatin (LIPITOR) 40 MG tablet Take 1 tablet (40 mg total) by mouth daily.   calcium carbonate (TUMS EX) 750 MG chewable tablet Chew 1 tablet by mouth daily.   Cholecalciferol (VITAMIN D) 2000 units CAPS Take 1 capsule by mouth daily.   denosumab (PROLIA) 60 MG/ML SOSY injection Inject 60 mg into the skin every 6 (six) months.   fexofenadine (ALLEGRA ALLERGY) 180 MG tablet Take 1 tablet (180 mg total) by mouth daily.   levothyroxine (SYNTHROID) 25 MCG tablet Take 1-2 tablets (25-50  mcg total) by mouth daily before breakfast. Alternating every other day   Magnesium 400 MG TABS Take 1 tablet by mouth daily.   omeprazole (PRILOSEC) 40 MG capsule Take 1 capsule (40 mg total) by mouth daily.   vitamin B-12 (CYANOCOBALAMIN) 1000 MCG tablet Take 1,000 mcg by mouth 3 (three) times a week.   escitalopram (LEXAPRO) 5 MG tablet Take 1 tablet (5 mg total) by mouth daily. (Patient not taking: Reported on 05/19/2021)   No facility-administered encounter medications on file as of 05/19/2021.    Allergies (verified) Alendronate, Levofloxacin, Nitrofurantoin monohyd macro, Sulfamethoxazole-trimethoprim, Etodolac, and Penicillins   History: Past Medical History:  Diagnosis Date   Allergy    Back pain    Basal cell carcinoma ~2000   L brow, treated with MOHs   Dyslipidemia    Dysplastic nevus 01/17/2009   right thigh, slight atypia    Fibrocystic breast disease    GERD (gastroesophageal reflux disease)    Hyperlipidemia    Menopause    Osteoporosis    Reflux esophagitis    RLS (restless legs syndrome)    Urinary dysfunction    Past Surgical History:  Procedure Laterality Date   BREAST CYST ASPIRATION Left    BREAST EXCISIONAL BIOPSY Left 1980's   neg   CATARACT EXTRACTION     EYE SURGERY     left   KYPHOPLASTY N/A 08/19/2016   Procedure: KYPHOPLASTY L3,L4;  Surgeon: MHessie Knows MD;  Location: [facility] ORS;  Service:  Orthopedics;  Laterality: N/A;   KYPHOPLASTY N/A 09/21/2016   Procedure: [id number];  Surgeon: Hessie Knows, MD;  Location: [facility] ORS;  Service: Orthopedics;  Laterality: N/A;   MOUTH SURGERY     POLYPECTOMY     SLING BLADE PROCEDURE     TOTAL ABDOMINAL HYSTERECTOMY     Family History  Problem Relation Age of Onset   Transient ischemic attack Mother    Atrial fibrillation Mother    Arthritis Mother        OA   Heart disease Father        CHF   CAD Father    Hyperlipidemia Sister    Arthritis Sister        OA   Drug abuse Son    Hypertension Son     Stroke Sister        Lived in a nursing home and died from COVID-63   Arthritis Sister        OA   Heart failure Other    Coronary artery disease Other    Stroke Other    Hyperlipidemia Other    Hypertension Other    Thyroid disease Other    Breast cancer Maternal Aunt    Breast cancer Paternal Aunt    Social History   Socioeconomic History   Marital status: Married    Spouse name: Not on file   Number of children: 1   Years of education: Not on file   Highest education level: High school graduate  Occupational History   Not on file  Tobacco Use   Smoking status: Never   Smokeless tobacco: Never  Vaping Use   Vaping Use: Never used  Substance and Sexual Activity   Alcohol use: No    Alcohol/week: 0.0 standard drinks   Drug use: No   Sexual activity: Yes    Partners: Male  Other Topics Concern   Not on file  Social History Narrative   Married   Gets regular exercise   One grown son having medical problems and moved in with since 2021   Social Determinants of Health   Financial Resource Strain: Low Risk    Difficulty of Paying Living Expenses: Not hard at all  Food Insecurity: No Food Insecurity   Worried About Charity fundraiser in the Last Year: Never true   Arboriculturist in the Last Year: Never true  Transportation Needs: No Transportation Needs   Lack of Transportation (Medical): No   Lack of Transportation (Non-Medical): No  Physical Activity: Insufficiently Active   Days of Exercise per Week: 3 days   Minutes of Exercise per Session: 30 min  Stress: No Stress Concern Present   Feeling of Stress : Not at all  Social Connections: Moderately Integrated   Frequency of Communication with Friends and Family: More than three times a week   Frequency of Social Gatherings with Friends and Family: Three times a week   Attends Religious Services: More than 4 times per year   Active Member of Clubs or Organizations: No   Attends Archivist  Meetings: Never   Marital Status: Married    Tobacco Counseling Counseling given: Not Answered   Clinical Intake:  Pre-visit preparation completed: Yes  Pain : No/denies pain     BMI - recorded: 19.9 Nutritional Status: BMI of 19-24  Normal Nutritional Risks: None Diabetes: No  How often do you need to have someone help you when you read instructions, pamphlets, or other written  materials from your doctor or pharmacy?: 1 - Never    Interpreter Needed?: No  Information entered by :: Clemetine Marker LPN   Activities of Daily Living In your present state of health, do you have any difficulty performing the following activities: 05/19/2021 05/11/2021  Hearing? Tempie Donning  Comment wears hearing aids -  Vision? N N  Difficulty concentrating or making decisions? N N  Walking or climbing stairs? N N  Dressing or bathing? N N  Doing errands, shopping? N N  Preparing Food and eating ? N -  Using the Toilet? N -  In the past six months, have you accidently leaked urine? Y -  Comment wears pads for protection -  Do you have problems with loss of bowel control? N -  Managing your Medications? N -  Managing your Finances? N -  Housekeeping or managing your Housekeeping? N -  Some recent data might be hidden    Patient Care Team: Steele Sizer, MD as PCP - General (Family Medicine) Beverly Gust, MD as Consulting Physician (Otolaryngology) Gabriel Carina Betsey Holiday, MD as Consulting Physician (Endocrinology) Brendolyn Patty, MD (Dermatology) Sharlotte Alamo, DPM (Podiatry) Neldon Labella, RN as Case Manager  Indicate any recent Medical Services you may have received from other than [facility] providers in the past year (date may be approximate).     Assessment:   This is a routine wellness examination for Heather Singh.  Hearing/Vision screen Hearing Screening - Comments:: Bilateral hearing aids maintained by [facility] Vision Screening - Comments:: Annual vision screenings with Dr.  Thomasene Ripple  Dietary issues and exercise activities discussed: Current Exercise Habits: Home exercise routine, Type of exercise: walking, Time (Minutes): 30, Frequency (Times/Week): 3, Weekly Exercise (Minutes/Week): 90, Intensity: Moderate, Exercise limited by: None identified   Goals Addressed             This Visit's Progress    DIET - INCREASE WATER INTAKE   On track    Recommend drinking 6-8 glasses of water per day       Depression Screen PHQ 2/9 Scores 05/19/2021 05/11/2021 04/13/2021 11/04/2020 10/23/2020 05/15/2020 05/05/2020  PHQ - 2 Score 0 0 0 0 0 0 0  PHQ- 9 Score 0 0 0 - - - -    Fall Risk Fall Risk  05/19/2021 05/11/2021 04/13/2021 11/04/2020 10/23/2020  Falls in the past year? 0 0 0 0 0  Number falls in past yr: 0 0 0 0 -  Injury with Fall? 0 0 0 0 -  Risk for fall due to : No Fall Risks No Fall Risks No Fall Risks No Fall Risks -  Follow up Falls prevention discussed Falls prevention discussed Falls prevention discussed Falls prevention discussed Falls prevention discussed    FALL RISK PREVENTION PERTAINING TO THE HOME:  Any stairs in or around the home? Yes  If so, are there any without handrails? No  Home free of loose throw rugs in walkways, pet beds, electrical cords, etc? Yes  Adequate lighting in your home to reduce risk of falls? Yes   ASSISTIVE DEVICES UTILIZED TO PREVENT FALLS:  Life alert? No  Use of a cane, walker or w/c? No  Grab bars in the bathroom? No  Shower chair or bench in shower? No  Elevated toilet seat or a handicapped toilet? Yes   TIMED UP AND GO:  Was the test performed? Yes .  Length of time to ambulate 10 feet: 5 sec.   Gait steady and fast without use of assistive device  Cognitive Function: Normal cognitive status assessed by direct observation by this Nurse Health Advisor. No abnormalities found.       6CIT Screen 05/11/2019 04/28/2018  What Year? 0 points 0 points  What month? 0 points 0 points  What time? 0 points 0 points   Count back from 20 0 points 0 points  Months in reverse 0 points 0 points  Repeat phrase 0 points 0 points  Total Score 0 0    Immunizations Immunization History  Administered Date(s) Administered   Fluad Quad(high Dose 65+) 12/19/2018, 11/23/2019, 01/01/2021   Influenza Split 02/25/2009, 11/27/2012   Influenza, High Dose Seasonal PF 11/13/2014, 11/17/2016   Influenza, Seasonal, Injecte, Preservative Fre 12/01/2009, 12/30/2010, 11/19/2011, 12/20/2013   Influenza,inj,Quad PF,6+ Mos 01/07/2016   Influenza-Unspecified 02/02/2018   PFIZER(Purple Top)SARS-COV-2 Vaccination 03/30/2019, 04/20/2019, 12/13/2019, 07/18/2020   Pneumococcal Conjugate-13 10/21/2014   Pneumococcal Polysaccharide-23 07/29/2008   Tdap 07/26/2007, 03/30/2016   Zoster Recombinat (Shingrix) 10/21/2017, 01/02/2018   Zoster, Live 02/25/2009    TDAP status: Up to date  Flu Vaccine status: Up to date  Pneumococcal vaccine status: Up to date  Covid-19 vaccine status: Completed vaccines  Qualifies for Shingles Vaccine? Yes   Zostavax completed Yes   Shingrix Completed?: Yes  Screening Tests Health Maintenance  Topic Date Due   COVID-19 Vaccine (5 - Booster for Pfizer series) 09/12/2020   MAMMOGRAM  02/13/2022   COLONOSCOPY (Pts 45-57yr Insurance coverage will need to be confirmed)  05/30/2023   TETANUS/TDAP  03/30/2026   Pneumonia Vaccine 78 Years old  Completed   INFLUENZA VACCINE  Completed   DEXA SCAN  Completed   Hepatitis C Screening  Completed   Zoster Vaccines- Shingrix  Completed   HPV VACCINES  Aged Out    Health Maintenance  Health Maintenance Due  Topic Date Due   COVID-19 Vaccine (5 - Booster for PDentonseries) 09/12/2020    Colorectal cancer screening: Type of screening: Colonoscopy. Completed 05/30/18. Repeat every 5 years  Mammogram status: Completed 02/13/21. Repeat every year  Bone Density status: Completed 12/01/20. Results reflect: Bone density results: OSTEOPOROSIS. Repeat  every 2 years.  Lung Cancer Screening: (Low Dose CT Chest recommended if Age 78-80years, 30 pack-year currently smoking OR have quit w/in 15years.) does not qualify.   Additional Screening:  Hepatitis C Screening: does qualify; Completed 12/28/11  Vision Screening: Recommended annual ophthalmology exams for early detection of glaucoma and other disorders of the eye. Is the patient up to date with their annual eye exam?  Yes  Who is the provider or what is the name of the [facility] the patient [facility]   Dental Screening: Recommended annual dental exams for proper oral hygiene  Community Resource Referral / Chronic Care Management: CRR required this visit?  No   CCM required this visit?  No      Plan:     I have personally reviewed and noted the following in the patients chart:   Medical and social history Use of alcohol, tobacco or illicit drugs  Current medications and supplements including opioid prescriptions.  Functional ability and status Nutritional status Physical activity Advanced directives List of other physicians Hospitalizations, surgeries, and ER visits in previous 12 months Vitals Screenings to include cognitive, depression, and falls Referrals and appointments  In addition, I have reviewed and discussed with patient certain preventive protocols, quality metrics, and best practice recommendations. A written personalized care plan for preventive services as well as general preventive  health recommendations were provided to patient.     Clemetine Marker, LPN   07/13/8333   Nurse Notes: none

## 2021-05-22 ENCOUNTER — Other Ambulatory Visit: Payer: Self-pay | Admitting: Family Medicine

## 2021-05-22 DIAGNOSIS — E039 Hypothyroidism, unspecified: Secondary | ICD-10-CM

## 2021-06-04 ENCOUNTER — Ambulatory Visit (INDEPENDENT_AMBULATORY_CARE_PROVIDER_SITE_OTHER): Payer: Medicare Other

## 2021-06-04 ENCOUNTER — Telehealth: Payer: Self-pay

## 2021-06-04 DIAGNOSIS — I6789 Other cerebrovascular disease: Secondary | ICD-10-CM

## 2021-06-04 DIAGNOSIS — E785 Hyperlipidemia, unspecified: Secondary | ICD-10-CM

## 2021-06-04 NOTE — Chronic Care Management (AMB) (Signed)
?Chronic Care Management  ? ?CCM RN Visit Note ? ?06/04/2021 ?Name: Heather Singh MRN: [id number] DOB: 12-Apr-1945 ? ?Subjective: ?Heather Singh is a 76 y.o. year old female who is a primary care patient of Heather Sizer, MD. The care management team was consulted for assistance with disease management and care coordination needs.   ? ?Engaged with patient by telephone for follow up visit in response to provider referral for case management and care coordination services.  ? ?Consent to Services:  ?The patient was given information about Chronic Care Management services, agreed to services, and gave verbal consent prior to initiation of services.  Please see initial visit note for detailed documentation.  ? ? ?Assessment: Review of patient past medical history, allergies, medications, health status, including review of consultants reports, laboratory and other test data, was performed as part of comprehensive evaluation and provision of chronic care management services.  ? ?SDOH (Social Determinants of Health) assessments and interventions performed:  No ? ?CCM Care Plan ? ?Allergies  ?Allergen Reactions  ? Alendronate   ?  Dyspepsia, inflamed esophagus   ? Levofloxacin Hives  ? Nitrofurantoin Monohyd Macro Hives  ? Sulfamethoxazole-Trimethoprim Hives  ? Etodolac Hives  ? Penicillins Hives and Rash  ?  Has patient had a PCN reaction causing immediate rash, facial/tongue/throat swelling, SOB or lightheadedness with hypotension: No ?Has patient had a PCN reaction causing severe rash involving mucus membranes or skin necrosis: No ?Has patient had a PCN reaction that required hospitalization: No ?Has patient had a PCN reaction occurring within the last 10 years: No ?If all of the above answers are "NO", then may proceed with Cephalosporin use. ?  ? ? ?Outpatient Encounter Medications as of 06/04/2021  ?Medication Sig Note  ? aspirin 81 MG EC tablet Take 1 tablet (81 mg total) by mouth daily. Swallow whole.   ?  atorvastatin (LIPITOR) 40 MG tablet Take 1 tablet (40 mg total) by mouth daily.   ? calcium carbonate (TUMS EX) 750 MG chewable tablet Chew 1 tablet by mouth daily.   ? Cholecalciferol (VITAMIN D) 2000 units CAPS Take 1 capsule by mouth daily.   ? denosumab (PROLIA) 60 MG/ML SOSY injection Inject 60 mg into the skin every 6 (six) months.   ? escitalopram (LEXAPRO) 5 MG tablet Take 1 tablet (5 mg total) by mouth daily. (Patient not taking: Reported on 05/19/2021) 05/19/2021: Has not picked up yet ?  ? fexofenadine (ALLEGRA ALLERGY) 180 MG tablet Take 1 tablet (180 mg total) by mouth daily.   ? levothyroxine (SYNTHROID) 25 MCG tablet TAKE 1 TO 2 TABLETS BY MOUTH DAILY BEFORE BREAKFAST. ALTERNATING EVERY OTHER DAY   ? Magnesium 400 MG TABS Take 1 tablet by mouth daily.   ? omeprazole (PRILOSEC) 40 MG capsule Take 1 capsule (40 mg total) by mouth daily.   ? vitamin B-12 (CYANOCOBALAMIN) 1000 MCG tablet Take 1,000 mcg by mouth 3 (three) times a week.   ? ?No facility-administered encounter medications on file as of 06/04/2021.  ? ? ?Patient Active Problem List  ? Diagnosis Date Noted  ? Cerebral microvascular disease 04/13/2021  ? Hx of adenomatous colonic polyps 05/19/2018  ? Senile purpura ([place]) 04/28/2018  ? Closed compression fracture of L3 lumbar vertebra, sequela 08/23/2016  ? Atrophic vaginitis 06/05/2015  ? Acquired hypothyroidism 10/19/2014  ? Perennial allergic rhinitis 10/19/2014  ? B12 deficiency 10/19/2014  ? Fibrocystic breast disease 10/19/2014  ? Gastro-esophageal reflux disease without esophagitis 10/19/2014  ? OP (osteoporosis) 10/19/2014  ?  Ovarian failure 10/19/2014  ? Hiatal hernia 10/19/2014  ? Restless legs syndrome 10/19/2014  ? Dyslipidemia 08/23/2008  ? Hyperlipidemia 08/23/2008  ? Vitamin D deficiency 10/10/2007  ? ? ?Patient Care Plan: RN Care Management  ?  ? ?Problem Identified: HLD ,Cerebral Microvascular Disease   ?  ? ?Long-Range Goal: Disease Progression Prevented or Minimized   ?Start Date:  06/04/2021  ?Expected End Date: 12/01/2021  ?Priority: High  ?Note:   ?Current Barriers:  ?Chronic Disease Management support and education needs related to HLD and Cerebral Microvascular Disease. ? ?RNCM Clinical Goal(s):  ?Patient will demonstrate Ongoing adherence to prescribed treatment plan for HLD and Cerebral Microvascular Disease through collaboration with the provider, RN Care manager and the care team. ? ?Interventions: ?1:1 collaboration with primary care provider regarding development and update of comprehensive plan of care as evidenced by provider attestation and co-signature ?Inter-disciplinary care team collaboration (see longitudinal plan of care) ?Evaluation of current treatment plan related to  self management and patient's adherence to plan as established by provider ? ? ?Hyperlipidemia Interventions:   ?Reviewed established cholesterol goals  ?Counseled on importance of regular laboratory monitoring as prescribed ?Reviewed role and benefits of statin for ASCVD risk reduction ?Discussed strategies to manage statin-induced myalgias. Reports tolerating statin well ?Reviewed importance of limiting foods high in cholesterol ?Reviewed activity goals. Advised to continue engaging in low impact activity as tolerated ? ? ?Cerebral Microvascular Disease: ?Discussed concerns related to cognition. Reports previous symptoms of confusion have cleared. Reports doing very well.  ?Discussed activity tolerance and functional status. Denies changes. Reports ambulating well. Denies changes in balance or ability to ambulate. Denies changes or decline in functional status.  ?Reviewed s/sx of stroke. Verbalized awareness of symptoms that require immediate medical attention. ?Reviewed importance of taking all medications as prescribed.  ?Reviewed importance of attending all scheduled provider appointments.  ? ? ?Patient Goals/Self-Care Activities: ?Take all medications as prescribed ?Attend all scheduled provider  appointments ?Call pharmacy for medication refills 3-7 days in advance of running out of medications ?Attend church or other social activities ?Perform all self care activities independently  ?Perform IADL's (shopping, preparing meals, housekeeping, managing finances) independently ?Call provider office for new concerns or questions  ? ? ?Follow Up Plan:   ?Will follow up in three months. ?  ?  ?PLAN: ?A member of the care management team will follow up in three months. ? ? ?Reagan Behlke,RN ?[place] Care Management ?[facility] ?[phone] ? ? ? ? ? ? ? ?

## 2021-06-04 NOTE — Telephone Encounter (Signed)
?  Care Management  ? ?Follow Up Note ? ? ?06/04/2021 ?Name: Heather Singh MRN: [id number] DOB: 10/27/1945 ? ? ?Primary Care Provider: Steele Sizer, MD ?Reason for referral : Chronic Care Management ? ? ?An unsuccessful telephone outreach was attempted today. The patient was referred to the case management team for assistance with care management and care coordination.  ? ? ?Follow Up Plan:  ?A HIPAA compliant voice message was left today requesting a return call. ? ? ?Vint Pola,RN ?[place] Care Management ?[facility] ?[phone] ? ?

## 2021-06-09 ENCOUNTER — Other Ambulatory Visit: Payer: Self-pay | Admitting: Family Medicine

## 2021-06-09 DIAGNOSIS — I700 Atherosclerosis of aorta: Secondary | ICD-10-CM

## 2021-06-12 DIAGNOSIS — I6789 Other cerebrovascular disease: Secondary | ICD-10-CM

## 2021-06-12 DIAGNOSIS — E785 Hyperlipidemia, unspecified: Secondary | ICD-10-CM | POA: Diagnosis not present

## 2021-06-12 NOTE — Progress Notes (Signed)
Name: Heather Singh   MRN: [id number]    DOB: January 29, 1946   Date:06/15/2021 ? ?     Progress Note ? ?Subjective ? ?Chief Complaint ? ?Follow Up ? ?HPI ? ?Episode of confusion: she woke up the morning of 01/05 with an episode of confusion , she states symptoms lasted for a few days but better by the time she went home from [facility] . She was having difficulty finding words , she had some difficulty grabbing things due to depth perception changes. Husband took her to [facility]. MRI just showed microvascular today. Discussed possible TIA  and will start low dose aspirin, continue atorvastatin. She woke up with sore throat on 01/06 followed by diarrhea and fatigue. Diagnosed with COVID-19 when she finally went to urgent care on 01/09 - she was given oral anti-viral and symptoms improved within 24 hours . No episodes of confusion since last visit.  Unchanged ? ?Stress: she states she was very busy carrying for her aunt , she died Mar 22, 2021. She was staying with her prior to her death, also worries about her 61 yo son that has been sick and moved in with them two years ago, there is always someone at their house to help him out, also having to help him financially.  She states feeling better , insomnia improved, only occasionally has problems sleeping now. Husband is worried about her because years ago she had an episode of feeling overwhelmed that triggered some jerking movements and mental fogginess and felt sad, she was treated for RLS. She is non confrontational .  During her visit in March we gave her Lexapro but she did not fill it, she is doing more mindfulness exercises, she is doing walking meditation.  ? ?Osteoporosis: she has been seeing Dr. Gabriel Carina, [place] started 10/2016. She is tolerating medication well, she had a bone density done by Dr. Gabriel Carina had her first Prolia Fall 2020, last done May 2022 and is going back in September for bone density and follow up with Dr. Gabriel Carina  ?  ?GERD and hiatal hernia : Has been seeing Dr.  Tami Ribas and he advised  Omeprazole '40mg'$ . Says symptoms have been well controlled without heartburn  or indigestion.  Still has a dry cough that is stable and chronic .  She saw pulmonologist , Dr. Raul Del Fall 2021. Currently she is taking Allegra now  ?  ?Hypothyroidism: she is taking levothyroxine as prescribed, taking one tablet one day and two every other day, TSH has been stable for a while. No change in bowel movements , dry skin skin is stable ?  ?Hyperlipidemia/Atherosclerosis of aorta : She is on Atorvastatin and last LDL was up a little , it was 73 but we will continue current dose for now, no myalgia.  ?  ?Senile purpura: stable, usually on arms , reassurance given . She states doing better lately ?  ?Chronic constipation: doing well on Miralax , se is now taking it a few times a week otherwise gives her diarrhea . No straining  ?  ?Vitamin B12 deficiency and vitamin D deficiency continue supplements  ? ?Patient Active Problem List  ? Diagnosis Date Noted  ? Cerebral microvascular disease 04/13/2021  ? Hx of adenomatous colonic polyps 05/19/2018  ? Senile purpura ([place]) 04/28/2018  ? Closed compression fracture of L3 lumbar vertebra, sequela 08/23/2016  ? Atrophic vaginitis 06/05/2015  ? Acquired hypothyroidism 10/19/2014  ? Perennial allergic rhinitis 10/19/2014  ? B12 deficiency 10/19/2014  ? Fibrocystic breast disease 10/19/2014  ?  Gastro-esophageal reflux disease without esophagitis 10/19/2014  ? OP (osteoporosis) 10/19/2014  ? Ovarian failure 10/19/2014  ? Hiatal hernia 10/19/2014  ? Restless legs syndrome 10/19/2014  ? Dyslipidemia 08/23/2008  ? Hyperlipidemia 08/23/2008  ? Vitamin D deficiency 10/10/2007  ? ? ?Past Surgical History:  ?Procedure Laterality Date  ? BREAST CYST ASPIRATION Left   ? BREAST EXCISIONAL BIOPSY Left 1980's  ? neg  ? CATARACT EXTRACTION    ? EYE SURGERY    ? left  ? KYPHOPLASTY N/A 08/19/2016  ? Procedure: KYPHOPLASTY L3,L4;  Surgeon: Hessie Knows, MD;  Location: [facility] ORS;   Service: Orthopedics;  Laterality: N/A;  ? KYPHOPLASTY N/A 09/21/2016  ? Procedure: [id number];  Surgeon: Hessie Knows, MD;  Location: [facility] ORS;  Service: Orthopedics;  Laterality: N/A;  ? MOUTH SURGERY    ? POLYPECTOMY    ? SLING BLADE PROCEDURE    ? TOTAL ABDOMINAL HYSTERECTOMY    ? ? ?Family History  ?Problem Relation Age of Onset  ? Transient ischemic attack Mother   ? Atrial fibrillation Mother   ? Arthritis Mother   ?     OA  ? Heart disease Father   ?     CHF  ? CAD Father   ? Hyperlipidemia Sister   ? Arthritis Sister   ?     OA  ? Drug abuse Son   ? Hypertension Son   ? Stroke Sister   ?     Lived in a nursing home and died from COVID-70  ? Arthritis Sister   ?     OA  ? Heart failure Other   ? Coronary artery disease Other   ? Stroke Other   ? Hyperlipidemia Other   ? Hypertension Other   ? Thyroid disease Other   ? Breast cancer Maternal Aunt   ? Breast cancer Paternal Aunt   ? ? ?Social History  ? ?Tobacco Use  ? Smoking status: Never  ? Smokeless tobacco: Never  ?Substance Use Topics  ? Alcohol use: No  ?  Alcohol/week: 0.0 standard drinks  ? ? ? ?Current Outpatient Medications:  ?  aspirin 81 MG EC tablet, Take 1 tablet (81 mg total) by mouth daily. Swallow whole., Disp: 30 tablet, Rfl: 12 ?  atorvastatin (LIPITOR) 40 MG tablet, TAKE 1 TABLET(40 MG) BY MOUTH DAILY, Disp: 90 tablet, Rfl: 0 ?  calcium carbonate (TUMS EX) 750 MG chewable tablet, Chew 1 tablet by mouth daily., Disp: , Rfl:  ?  Cholecalciferol (VITAMIN D) 2000 units CAPS, Take 1 capsule by mouth daily., Disp: , Rfl:  ?  denosumab (PROLIA) 60 MG/ML SOSY injection, Inject 60 mg into the skin every 6 (six) months., Disp: , Rfl:  ?  fexofenadine (ALLEGRA ALLERGY) 180 MG tablet, Take 1 tablet (180 mg total) by mouth daily., Disp: 90 tablet, Rfl: 0 ?  levothyroxine (SYNTHROID) 25 MCG tablet, TAKE 1 TO 2 TABLETS BY MOUTH DAILY BEFORE BREAKFAST. ALTERNATING EVERY OTHER DAY, Disp: 135 tablet, Rfl: 1 ?  Magnesium 400 MG TABS, Take 1 tablet by  mouth daily., Disp: , Rfl:  ?  omeprazole (PRILOSEC) 40 MG capsule, Take 1 capsule (40 mg total) by mouth daily., Disp: 90 capsule, Rfl: 1 ?  vitamin B-12 (CYANOCOBALAMIN) 1000 MCG tablet, Take 1,000 mcg by mouth 3 (three) times a week., Disp: , Rfl:  ? ?Allergies  ?Allergen Reactions  ? Alendronate   ?  Dyspepsia, inflamed esophagus   ? Levofloxacin Hives  ? Nitrofurantoin Monohyd Macro Hives  ?  Sulfamethoxazole-Trimethoprim Hives  ? Etodolac Hives  ? Penicillins Hives and Rash  ?  Has patient had a PCN reaction causing immediate rash, facial/tongue/throat swelling, SOB or lightheadedness with hypotension: No ?Has patient had a PCN reaction causing severe rash involving mucus membranes or skin necrosis: No ?Has patient had a PCN reaction that required hospitalization: No ?Has patient had a PCN reaction occurring within the last 10 years: No ?If all of the above answers are "NO", then may proceed with Cephalosporin use. ?  ? ? ?I personally reviewed active problem list, medication list, allergies, family history, social history, health maintenance with the patient/caregiver today. ? ? ?ROS ? ?Constitutional: Negative for fever or weight change.  ?Respiratory: Negative for cough and shortness of breath.   ?Cardiovascular: Negative for chest pain or palpitations.  ?Gastrointestinal: Negative for abdominal pain, no bowel changes.  ?Musculoskeletal: Negative for gait problem or joint swelling.  ?Skin: Negative for rash.  ?Neurological: Negative for dizziness or headache.  ?No other specific complaints in a complete review of systems (except as listed in HPI above).  ? ?Objective ? ?Vitals:  ? 06/15/21 1030  ?BP: 128/72  ?Pulse: 77  ?Resp: 16  ?SpO2: 99%  ?Weight: 104 lb (47.2 kg)  ?Height: '5\' 1"'$  (1.549 m)  ? ? ?Body mass index is 19.65 kg/m?. ? ?Physical Exam ? ?Constitutional: Patient appears well-developed and well-nourished. Overweight.  No distress.  ?HEENT: head atraumatic, normocephalic, pupils equal and reactive  to light, , neck supple, no thyromegaly  ?Cardiovascular: Normal rate, regular rhythm and normal heart sounds.  No murmur heard. No BLE edema. ?Pulmonary/Chest: Effort normal and breath sounds normal. No respi

## 2021-06-15 ENCOUNTER — Ambulatory Visit (INDEPENDENT_AMBULATORY_CARE_PROVIDER_SITE_OTHER): Payer: Medicare Other | Admitting: Family Medicine

## 2021-06-15 ENCOUNTER — Encounter: Payer: Self-pay | Admitting: Family Medicine

## 2021-06-15 VITALS — BP 128/72 | HR 77 | Resp 16 | Ht 61.0 in | Wt 104.0 lb

## 2021-06-15 DIAGNOSIS — F341 Dysthymic disorder: Secondary | ICD-10-CM | POA: Diagnosis not present

## 2021-06-15 DIAGNOSIS — E039 Hypothyroidism, unspecified: Secondary | ICD-10-CM | POA: Diagnosis not present

## 2021-06-15 DIAGNOSIS — E559 Vitamin D deficiency, unspecified: Secondary | ICD-10-CM | POA: Diagnosis not present

## 2021-06-15 DIAGNOSIS — I6789 Other cerebrovascular disease: Secondary | ICD-10-CM

## 2021-06-15 DIAGNOSIS — I700 Atherosclerosis of aorta: Secondary | ICD-10-CM | POA: Diagnosis not present

## 2021-06-15 DIAGNOSIS — D692 Other nonthrombocytopenic purpura: Secondary | ICD-10-CM | POA: Diagnosis not present

## 2021-06-15 DIAGNOSIS — R053 Chronic cough: Secondary | ICD-10-CM | POA: Diagnosis not present

## 2021-06-15 DIAGNOSIS — D708 Other neutropenia: Secondary | ICD-10-CM

## 2021-06-15 DIAGNOSIS — E538 Deficiency of other specified B group vitamins: Secondary | ICD-10-CM | POA: Diagnosis not present

## 2021-06-15 DIAGNOSIS — K219 Gastro-esophageal reflux disease without esophagitis: Secondary | ICD-10-CM

## 2021-06-15 DIAGNOSIS — M810 Age-related osteoporosis without current pathological fracture: Secondary | ICD-10-CM

## 2021-06-15 DIAGNOSIS — E785 Hyperlipidemia, unspecified: Secondary | ICD-10-CM | POA: Diagnosis not present

## 2021-06-15 MED ORDER — OMEPRAZOLE 40 MG PO CPDR: 40.0000 mg | DELAYED_RELEASE_CAPSULE | Freq: Every day | ORAL | 1 refills | Status: DC

## 2021-07-10 ENCOUNTER — Other Ambulatory Visit: Payer: Self-pay | Admitting: Family Medicine

## 2021-07-10 DIAGNOSIS — K219 Gastro-esophageal reflux disease without esophagitis: Secondary | ICD-10-CM

## 2021-07-10 DIAGNOSIS — R053 Chronic cough: Secondary | ICD-10-CM

## 2021-07-23 DIAGNOSIS — M810 Age-related osteoporosis without current pathological fracture: Secondary | ICD-10-CM | POA: Diagnosis not present

## 2021-08-31 DIAGNOSIS — R42 Dizziness and giddiness: Secondary | ICD-10-CM | POA: Diagnosis not present

## 2021-08-31 DIAGNOSIS — H903 Sensorineural hearing loss, bilateral: Secondary | ICD-10-CM | POA: Diagnosis not present

## 2021-08-31 DIAGNOSIS — H6122 Impacted cerumen, left ear: Secondary | ICD-10-CM | POA: Diagnosis not present

## 2021-09-10 ENCOUNTER — Ambulatory Visit: Payer: Medicare Other

## 2021-09-10 ENCOUNTER — Other Ambulatory Visit: Payer: Self-pay | Admitting: Family Medicine

## 2021-09-10 DIAGNOSIS — I700 Atherosclerosis of aorta: Secondary | ICD-10-CM

## 2021-10-20 ENCOUNTER — Ambulatory Visit: Payer: Self-pay

## 2021-10-20 NOTE — Chronic Care Management (AMB) (Signed)
   10/20/2021  Heather Singh [birth year] [id number]  Documentation encounter created to complete case transition. The care management team will continue to follow for care coordination as needed.   [place] [facility] [phone]

## 2021-10-23 ENCOUNTER — Other Ambulatory Visit: Payer: Self-pay | Admitting: Family Medicine

## 2021-10-23 DIAGNOSIS — E039 Hypothyroidism, unspecified: Secondary | ICD-10-CM

## 2021-10-24 ENCOUNTER — Emergency Department: Payer: Medicare Other

## 2021-10-24 ENCOUNTER — Observation Stay: Payer: Medicare Other

## 2021-10-24 ENCOUNTER — Encounter: Payer: Self-pay | Admitting: Emergency Medicine

## 2021-10-24 ENCOUNTER — Other Ambulatory Visit: Payer: Self-pay

## 2021-10-24 ENCOUNTER — Observation Stay
Admission: EM | Admit: 2021-10-24 | Discharge: 2021-10-25 | Disposition: A | Discharge status: Home / Self Care | Payer: Medicare Other | Attending: Internal Medicine | Admitting: Internal Medicine

## 2021-10-24 DIAGNOSIS — E538 Deficiency of other specified B group vitamins: Secondary | ICD-10-CM | POA: Diagnosis present

## 2021-10-24 DIAGNOSIS — K219 Gastro-esophageal reflux disease without esophagitis: Secondary | ICD-10-CM | POA: Diagnosis not present

## 2021-10-24 DIAGNOSIS — Z7982 Long term (current) use of aspirin: Secondary | ICD-10-CM | POA: Diagnosis not present

## 2021-10-24 DIAGNOSIS — I700 Atherosclerosis of aorta: Secondary | ICD-10-CM

## 2021-10-24 DIAGNOSIS — E039 Hypothyroidism, unspecified: Secondary | ICD-10-CM | POA: Diagnosis not present

## 2021-10-24 DIAGNOSIS — G459 Transient cerebral ischemic attack, unspecified: Principal | ICD-10-CM | POA: Diagnosis present

## 2021-10-24 DIAGNOSIS — Z85828 Personal history of other malignant neoplasm of skin: Secondary | ICD-10-CM | POA: Diagnosis not present

## 2021-10-24 DIAGNOSIS — E785 Hyperlipidemia, unspecified: Secondary | ICD-10-CM | POA: Diagnosis not present

## 2021-10-24 DIAGNOSIS — R200 Anesthesia of skin: Secondary | ICD-10-CM | POA: Diagnosis not present

## 2021-10-24 DIAGNOSIS — Z79899 Other long term (current) drug therapy: Secondary | ICD-10-CM | POA: Diagnosis not present

## 2021-10-24 DIAGNOSIS — I6523 Occlusion and stenosis of bilateral carotid arteries: Secondary | ICD-10-CM | POA: Diagnosis not present

## 2021-10-24 LAB — CBG MONITORING, ED: Glucose-Capillary: 116 mg/dL — ABNORMAL HIGH (ref 70–99)

## 2021-10-24 LAB — DIFFERENTIAL
Abs Immature Granulocytes: 0.03 10*3/uL (ref 0.00–0.07)
Basophils Absolute: 0 10*3/uL (ref 0.0–0.1)
Basophils Relative: 0 %
Eosinophils Absolute: 0.1 10*3/uL (ref 0.0–0.5)
Eosinophils Relative: 2 %
Immature Granulocytes: 0 %
Lymphocytes Relative: 34 %
Lymphs Abs: 2.5 10*3/uL (ref 0.7–4.0)
Monocytes Absolute: 0.6 10*3/uL (ref 0.1–1.0)
Monocytes Relative: 9 %
Neutro Abs: 4 10*3/uL (ref 1.7–7.7)
Neutrophils Relative %: 55 %

## 2021-10-24 LAB — COMPREHENSIVE METABOLIC PANEL
ALT: 18 U/L (ref 0–44)
AST: 27 U/L (ref 15–41)
Albumin: 4.5 g/dL (ref 3.5–5.0)
Alkaline Phosphatase: 49 U/L (ref 38–126)
Anion gap: 5 (ref 5–15)
BUN: 20 mg/dL (ref 8–23)
CO2: 27 mmol/L (ref 22–32)
Calcium: 9.1 mg/dL (ref 8.9–10.3)
Chloride: 105 mmol/L (ref 98–111)
Creatinine, Ser: 0.81 mg/dL (ref 0.44–1.00)
GFR, Estimated: >60 mL/min (ref >60)
Glucose, Bld: 116 mg/dL — ABNORMAL HIGH (ref 70–99)
Potassium: 4 mmol/L (ref 3.5–5.1)
Sodium: 137 mmol/L (ref 135–145)
Total Bilirubin: 0.5 mg/dL (ref 0.3–1.2)
Total Protein: 7.4 g/dL (ref 6.5–8.1)

## 2021-10-24 LAB — CBC
HCT: 37.1 % (ref 36.0–46.0)
Hemoglobin: 12.2 g/dL (ref 12.0–15.0)
MCH: 32 pg (ref 26.0–34.0)
MCHC: 32.9 g/dL (ref 30.0–36.0)
MCV: 97.4 fL (ref 80.0–100.0)
Platelets: 267 10*3/uL (ref 150–400)
RBC: 3.81 MIL/uL — ABNORMAL LOW (ref 3.87–5.11)
RDW: 12.7 % (ref 11.5–15.5)
WBC: 7.3 10*3/uL (ref 4.0–10.5)
nRBC: 0 % (ref 0.0–0.2)

## 2021-10-24 LAB — APTT: aPTT: 26 seconds (ref 24–36)

## 2021-10-24 LAB — ETHANOL: Alcohol, Ethyl (B): <10 mg/dL (ref <10)

## 2021-10-24 LAB — PROTIME-INR
INR: 0.8 (ref 0.8–1.2)
Prothrombin Time: 11.1 seconds — ABNORMAL LOW (ref 11.4–15.2)

## 2021-10-24 MED ORDER — SODIUM CHLORIDE 0.9 % IV SOLN
INTRAVENOUS | Status: DC
Start: 2021-10-24 — End: 2021-10-25

## 2021-10-24 MED ORDER — ACETAMINOPHEN 325 MG PO TABS: 650.0000 mg | ORAL_TABLET | Freq: Four times a day (QID) | ORAL | Status: DC | PRN

## 2021-10-24 MED ORDER — ACETAMINOPHEN 650 MG RE SUPP: 650.0000 mg | Freq: Four times a day (QID) | RECTAL | Status: DC | PRN

## 2021-10-24 MED ORDER — CLOPIDOGREL BISULFATE 75 MG PO TABS
300.0000 mg | ORAL_TABLET | Freq: Once | ORAL | Status: AC
  Administered 2021-10-24: 300 mg via ORAL
  Filled 2021-10-24: qty 4

## 2021-10-24 MED ORDER — MAGNESIUM HYDROXIDE 400 MG/5ML PO SUSP: 30.0000 mL | Freq: Every day | ORAL | Status: DC | PRN

## 2021-10-24 MED ORDER — VITAMIN D3 25 MCG (1000 UNIT) PO TABS
2000.0000 [IU] | ORAL_TABLET | Freq: Every day | ORAL | Status: DC
  Administered 2021-10-25: 2000 [IU] via ORAL
  Filled 2021-10-24 (×2): qty 2

## 2021-10-24 MED ORDER — ONDANSETRON HCL 4 MG/2ML IJ SOLN: 4.0000 mg | Freq: Four times a day (QID) | INTRAMUSCULAR | Status: DC | PRN

## 2021-10-24 MED ORDER — ATORVASTATIN CALCIUM 20 MG PO TABS
40.0000 mg | ORAL_TABLET | Freq: Every evening | ORAL | Status: DC
  Administered 2021-10-24: 40 mg via ORAL
  Filled 2021-10-24: qty 2

## 2021-10-24 MED ORDER — STROKE: EARLY STAGES OF RECOVERY BOOK: Freq: Once | Status: DC

## 2021-10-24 MED ORDER — ONDANSETRON HCL 4 MG PO TABS: 4.0000 mg | ORAL_TABLET | Freq: Four times a day (QID) | ORAL | Status: DC | PRN

## 2021-10-24 MED ORDER — MAGNESIUM OXIDE -MG SUPPLEMENT 400 (240 MG) MG PO TABS
400.0000 mg | ORAL_TABLET | Freq: Every day | ORAL | Status: DC
  Administered 2021-10-24 – 2021-10-25 (×2): 400 mg via ORAL
  Filled 2021-10-24 (×2): qty 1

## 2021-10-24 MED ORDER — IOHEXOL 350 MG/ML SOLN
75.0000 mL | Freq: Once | INTRAVENOUS | Status: AC | PRN
  Administered 2021-10-24: 75 mL via INTRAVENOUS

## 2021-10-24 MED ORDER — VITAMIN B-12 1000 MCG PO TABS: 1000.0000 ug | ORAL_TABLET | ORAL | Status: DC

## 2021-10-24 MED ORDER — SODIUM CHLORIDE 0.9% FLUSH
3.0000 mL | Freq: Once | INTRAVENOUS | Status: AC
  Administered 2021-10-24: 3 mL via INTRAVENOUS

## 2021-10-24 MED ORDER — PANTOPRAZOLE SODIUM 40 MG PO TBEC
40.0000 mg | DELAYED_RELEASE_TABLET | Freq: Every day | ORAL | Status: DC
  Administered 2021-10-25: 40 mg via ORAL
  Filled 2021-10-24: qty 1

## 2021-10-24 MED ORDER — LORATADINE 10 MG PO TABS
10.0000 mg | ORAL_TABLET | Freq: Every day | ORAL | Status: DC
  Administered 2021-10-25: 10 mg via ORAL
  Filled 2021-10-24 (×2): qty 1

## 2021-10-24 MED ORDER — TRAZODONE HCL 50 MG PO TABS: 25.0000 mg | ORAL_TABLET | Freq: Every evening | ORAL | Status: DC | PRN

## 2021-10-24 MED ORDER — ENOXAPARIN SODIUM 40 MG/0.4ML IJ SOSY
40.0000 mg | PREFILLED_SYRINGE | INTRAMUSCULAR | Status: DC
  Administered 2021-10-24: 40 mg via SUBCUTANEOUS
  Filled 2021-10-24: qty 0.4

## 2021-10-24 MED ORDER — CALCIUM CARBONATE ANTACID 500 MG PO CHEW
2.0000 | CHEWABLE_TABLET | Freq: Every day | ORAL | Status: DC
  Administered 2021-10-25: 400 mg via ORAL
  Filled 2021-10-24: qty 2

## 2021-10-24 MED ORDER — LEVOTHYROXINE SODIUM 25 MCG PO TABS
25.0000 ug | ORAL_TABLET | Freq: Every morning | ORAL | Status: DC
  Administered 2021-10-25: 25 ug via ORAL
  Filled 2021-10-24: qty 1

## 2021-10-24 MED ORDER — ASPIRIN 81 MG PO TBEC
81.0000 mg | DELAYED_RELEASE_TABLET | Freq: Every day | ORAL | Status: DC
  Administered 2021-10-25: 81 mg via ORAL
  Filled 2021-10-24: qty 1

## 2021-10-24 MED ORDER — CLOPIDOGREL BISULFATE 75 MG PO TABS
75.0000 mg | ORAL_TABLET | Freq: Every day | ORAL | Status: DC
  Administered 2021-10-25: 75 mg via ORAL
  Filled 2021-10-24: qty 1

## 2021-10-24 NOTE — Consult Note (Signed)
[facility] Telemedicine Consult   Requesting Provider: Crissie Reese Consult Participants: Patient, nurse, husband Location of the patient: [facility]  This consult was provided via telemedicine with 2-way video and audio communication. The patient/family was informed that care would be provided in this way and agreed to receive care in this manner.    Chief Complaint: Right-sided numbness  HPI: Ms. Ju is a 76 year old female who presents with right-sided numbness that started at 530 this evening.  She states that she first noticed it in her right arm and then involve her right face as well.  It has mostly resolved, though still does involve the right side of her temple to some degree.    LKW: 5:30 PM tpa given?: No, mild symptoms IR Thrombectomy? No, no signs of LVO Time of teleneurologist evaluation: 6:31 PM  Exam: Vitals:   10/24/21 1810  BP: (!) 161/95  Pulse: 86  Resp: 16  Temp: 98.3 F (36.8 C)  SpO2: 100%    General: In bed, NAD  1A: Level of Consciousness - 0 1B: Ask Month and Age - 0 1C: 'Blink Eyes' & 'Squeeze Hands' - 0 2: Test Horizontal Extraocular Movements - 0 3: Test Visual Fields - 0 4: Test Facial Palsy - 0 5A: Test Left Arm Motor Drift - 0 5B: Test Right Arm Motor Drift - 0 6A: Test Left Leg Motor Drift - 0 6B: Test Right Leg Motor Drift - 0 7: Test Limb Ataxia - 0 8: Test Sensation - 0 9: Test Language/Aphasia- 0 10: Test Dysarthria - 0 11: Test Extinction/Inattention - 0 NIHSS score: Zero   Imaging Reviewed: CT head with no acute findings  Labs reviewed in epic and pertinent values follow: Cr 0.81   Assessment: 76 year old female with transient right face and arm numbness which is mostly resolved.  My suspicion is that this represents TIA or very mild stroke and I would favor admitting her for secondary stroke prevention work-up.  Recommendations:  - HgbA1c, fasting lipid panel - MRI of the brain  without contrast - Frequent neuro checks - Echocardiogram - CTA head and neck - Prophylactic therapy-Antiplatelet med: Aspirin - dose '81mg'$  and plavix '75mg'$  daily  after '300mg'$  load  - Risk factor modification - Telemetry monitoring - PT consult, OT consult, Speech consult  Roland Rack, MD [facility] [phone]  If 7pm- 7am, please page neurology on call as listed in AMION.

## 2021-10-24 NOTE — ED Notes (Signed)
Patient transported to CT 

## 2021-10-24 NOTE — Assessment & Plan Note (Signed)
-   We will continue vitamin B12. 

## 2021-10-24 NOTE — Assessment & Plan Note (Signed)
-   We will continue statin therapy. 

## 2021-10-24 NOTE — ED Triage Notes (Signed)
Pt reports around 5:30 pm numbness and tingling on the right side of face and arm. Pt shaking in triage reports she feels cold. Pt talks in complete sentences, also reports changes in her hearing on her right side.

## 2021-10-24 NOTE — ED Notes (Signed)
RN called lab to add on Ha1c. Per lab, in process now.

## 2021-10-24 NOTE — Assessment & Plan Note (Signed)
-   We will continue PPI therapy 

## 2021-10-24 NOTE — Assessment & Plan Note (Signed)
-   This is manifested by right upper extremity and facial numbness, almost resolving. - The patient will be admitted to a medical telemetry observation bed. - We will continue aspirin and Plavix. - She will be placed on statin therapy and will check fasting lipids. - Neurochecks will be followed. - Neurology consult was obtained on follow-up will be provided. - Dr. Leonel Ramsay is aware about the patient. - PT/OT and ST consults will be obtained.

## 2021-10-24 NOTE — ED Notes (Signed)
Code stroke 18:00

## 2021-10-24 NOTE — ED Provider Notes (Signed)
[facility] Provider Note    Event Date/Time   First MD Initiated Contact with Patient 10/24/21 1818     (approximate)   History   Numbness and Code Stroke   HPI  Heather Singh is a 76 y.o. female  here with R arm and facial numbness. Pt was in her usual state of health until 5:30 PM, when she developed acute onset of R arm numbness. She told her husband that her hand "didn't feel right." No overt weakness. Pt states that she then developed some mild R sided facial numbness along with sensation that she couldn't hear well. No headache. No vision changes. No weakness. She was activated as a CODE STROKE on arrival.      Physical Exam   Triage Vital Signs: ED Triage Vitals  Enc Vitals Group     BP 10/24/21 1810 (!) 161/95     Pulse Rate 10/24/21 1810 86     Resp 10/24/21 1810 16     Temp 10/24/21 1810 98.3 F (36.8 C)     Temp Source 10/24/21 1810 Oral     SpO2 10/24/21 1810 100 %     Weight 10/24/21 1810 106 lb (48.1 kg)     Height 10/24/21 1810 '5\' 2"'$  (1.575 m)     Head Circumference --      Peak Flow --      Pain Score 10/24/21 1809 0     Pain Loc --      Pain Edu? --      Excl. in [place]? --     Most recent vital signs: Vitals:   10/25/21 0130 10/25/21 0200  BP: 125/70 131/70  Pulse: 63 60  Resp: (!) 21 (!) 21  Temp:    SpO2: 99% 100%     General: Awake, no distress.  CV:  Good peripheral perfusion.  Resp:  Normal effort.  Abd:  No distention.  Other:    Neurological Exam:  Mental Status: Alert and oriented to person, place, and time. Attention and concentration normal. Speech clear. Recent memory is intact. Cranial Nerves: Visual fields grossly intact. EOMI and PERRLA. No nystagmus noted. Facial sensation intact at forehead, maxillary cheek, and chin/mandible bilaterally. No facial asymmetry or weakness. Hearing grossly normal. Uvula is midline, and palate elevates symmetrically. Normal SCM and trapezius strength. Tongue midline  without fasciculations. Motor: Muscle strength 5/5 in proximal and distal UE and LE bilaterally. No pronator drift. Muscle tone normal. Sensation: Intact to light touch in upper and lower extremities distally bilaterally.  Coordination: Normal FTN bilaterally.     ED Results / Procedures / Treatments   Labs (all labs ordered are listed, but only abnormal results are displayed) Labs Reviewed  PROTIME-INR - Abnormal; Notable for the following components:      Result Value   Prothrombin Time 11.1 (*)    All other components within normal limits  CBC - Abnormal; Notable for the following components:   RBC 3.81 (*)    All other components within normal limits  COMPREHENSIVE METABOLIC PANEL - Abnormal; Notable for the following components:   Glucose, Bld 116 (*)    All other components within normal limits  CBG MONITORING, ED - Abnormal; Notable for the following components:   Glucose-Capillary 116 (*)    All other components within normal limits  APTT  DIFFERENTIAL  ETHANOL  HEMOGLOBIN A1C  LIPID PANEL  BASIC METABOLIC PANEL  CBC  I-STAT CREATININE, ED  CBG MONITORING, ED  EKG    RADIOLOGY CT Head: NAICA CT Angio:Pending   I also independently reviewed and agree with radiologist interpretations.   PROCEDURES:  Critical Care performed: No   MEDICATIONS ORDERED IN ED: Medications  clopidogrel (PLAVIX) tablet 300 mg (300 mg Oral Given 10/24/21 1949)    And  clopidogrel (PLAVIX) tablet 75 mg (has no administration in time range)  aspirin EC tablet 81 mg (has no administration in time range)  atorvastatin (LIPITOR) tablet 40 mg (40 mg Oral Given 10/24/21 2135)  levothyroxine (SYNTHROID) tablet 25 mcg (has no administration in time range)  calcium carbonate (TUMS - dosed in mg elemental calcium) chewable tablet 400 mg of elemental calcium (has no administration in time range)  pantoprazole (PROTONIX) EC tablet 40 mg (has no administration in time range)   cholecalciferol (VITAMIN D3) tablet 2,000 Units (has no administration in time range)  cyanocobalamin (VITAMIN B12) tablet 1,000 mcg (has no administration in time range)  magnesium oxide (MAG-OX) tablet 400 mg (400 mg Oral Given 10/24/21 2135)  loratadine (CLARITIN) tablet 10 mg (10 mg Oral Not Given 10/24/21 2136)   stroke: early stages of recovery book (has no administration in time range)  enoxaparin (LOVENOX) injection 40 mg (40 mg Subcutaneous Given 10/24/21 2136)  0.9 %  sodium chloride infusion ( Intravenous New Bag/Given 10/24/21 2142)  acetaminophen (TYLENOL) tablet 650 mg (has no administration in time range)    Or  acetaminophen (TYLENOL) suppository 650 mg (has no administration in time range)  traZODone (DESYREL) tablet 25 mg (has no administration in time range)  magnesium hydroxide (MILK OF MAGNESIA) suspension 30 mL (has no administration in time range)  ondansetron (ZOFRAN) tablet 4 mg (has no administration in time range)    Or  ondansetron (ZOFRAN) injection 4 mg (has no administration in time range)  sodium chloride flush (NS) 0.9 % injection 3 mL (3 mLs Intravenous Given 10/24/21 1830)  iohexol (OMNIPAQUE) 350 MG/ML injection 75 mL (75 mLs Intravenous Contrast Given 10/24/21 1932)     IMPRESSION / MDM / ASSESSMENT AND PLAN / ED COURSE  I reviewed the triage vital signs and the nursing notes.                               Ddx:  Differential includes the following, with pertinent life- or limb-threatening emergencies considered:  TIA, CVA, partial seizure, neuropathy, cervical radiculopathy, complex migraine  Patient's presentation is most consistent with acute presentation with potential threat to life or bodily function.  MDM:  76 yo F with h/o hypothyroidism, GERD, here with transient hand/arm numbness and facial numbness, now resolved. Suspect TIA clinically. No weakness or persistent neuro sx at this time. Pt activated as a code stroke, CT head reviewed and is  negative. Not tPA candidate given resolving sx and otherwise low stroke scale. Will admit for TIA eval. Pt had ASA listed as allergy but she states she can take ASA. Plavix ordered by Neuro. No other acute changes in health. CBC unremarkable. CMP normal.   MEDICATIONS GIVEN IN ED: Medications  clopidogrel (PLAVIX) tablet 300 mg (300 mg Oral Given 10/24/21 1949)    And  clopidogrel (PLAVIX) tablet 75 mg (has no administration in time range)  aspirin EC tablet 81 mg (has no administration in time range)  atorvastatin (LIPITOR) tablet 40 mg (40 mg Oral Given 10/24/21 2135)  levothyroxine (SYNTHROID) tablet 25 mcg (has no administration in time range)  calcium carbonate (TUMS -  dosed in mg elemental calcium) chewable tablet 400 mg of elemental calcium (has no administration in time range)  pantoprazole (PROTONIX) EC tablet 40 mg (has no administration in time range)  cholecalciferol (VITAMIN D3) tablet 2,000 Units (has no administration in time range)  cyanocobalamin (VITAMIN B12) tablet 1,000 mcg (has no administration in time range)  magnesium oxide (MAG-OX) tablet 400 mg (400 mg Oral Given 10/24/21 2135)  loratadine (CLARITIN) tablet 10 mg (10 mg Oral Not Given 10/24/21 2136)   stroke: early stages of recovery book (has no administration in time range)  enoxaparin (LOVENOX) injection 40 mg (40 mg Subcutaneous Given 10/24/21 2136)  0.9 %  sodium chloride infusion ( Intravenous New Bag/Given 10/24/21 2142)  acetaminophen (TYLENOL) tablet 650 mg (has no administration in time range)    Or  acetaminophen (TYLENOL) suppository 650 mg (has no administration in time range)  traZODone (DESYREL) tablet 25 mg (has no administration in time range)  magnesium hydroxide (MILK OF MAGNESIA) suspension 30 mL (has no administration in time range)  ondansetron (ZOFRAN) tablet 4 mg (has no administration in time range)    Or  ondansetron (ZOFRAN) injection 4 mg (has no administration in time range)  sodium  chloride flush (NS) 0.9 % injection 3 mL (3 mLs Intravenous Given 10/24/21 1830)  iohexol (OMNIPAQUE) 350 MG/ML injection 75 mL (75 mLs Intravenous Contrast Given 10/24/21 1932)     Consults:  Neurology Hospitalist   EMR reviewed       FINAL CLINICAL IMPRESSION(S) / ED DIAGNOSES   Final diagnoses:  TIA (transient ischemic attack)     Rx / DC Orders   ED Discharge Orders     None        Note:  This document was prepared using Dragon voice recognition software and may include unintentional dictation errors.   Duffy Bruce, MD 10/25/21 [phone]

## 2021-10-24 NOTE — H&P (Addendum)
[place]   PATIENT NAME: Heather Singh    MR#:  [id number]  DATE OF BIRTH:  02/03/1946  DATE OF ADMISSION:  10/24/2021  PRIMARY CARE PHYSICIAN: Steele Sizer, MD   Patient is coming from: Home  REQUESTING/REFERRING PHYSICIAN: Duffy Bruce, MD  CHIEF COMPLAINT:   Chief Complaint  Patient presents with   Numbness   Code Stroke    HISTORY OF PRESENT ILLNESS:  Heather Singh is a 76 y.o. female with medical history significant for dyslipidemia, fibrocystic breast disease, GERD, hypertension lipidemia, GERD and RLS, who presented to the emergency room with acute onset of right arm and hand as well as facial numbness that started around 5:30 PM with right arm numbness.  No headache or dizziness or blurred vision.  She denies any focal muscle weakness.  No chest pain or palpitations.  No cough or wheezing or dyspnea.  No bleeding diathesis.  She takes her Eliquis and baby aspirin.  Her numbness had resolved during my eye interview.  ED Course: Upon presentation to the emergency room, BP was 161/95 with otherwise normal vital signs.  CMP was within normal and CBC was unremarkable.   EKG as reviewed by me :  EKG showed normal sinus rhythm with a rate of 75 with borderline right axis deviation. Imaging: Code stroke head CT scan revealed mild atrophy and moderate diffuse white matter disease likely reflects sequela of chronic microvascular ischemia with no acute intracranial abnormality.  Head and neck CTA is currently pending.  Neurology consult was obtained by Dr. Leonel Ramsay.  The patient was not considered a candidate for tPA due to near resolution of her symptoms the patient was given p.o. loading Plavix dose.  He will be admitted to an observation medical telemetry bed for further evaluation and management. PAST MEDICAL HISTORY:   Past Medical History:  Diagnosis Date   Allergy    Back pain    Basal cell carcinoma ~2000   L brow, treated with MOHs   Dyslipidemia     Dysplastic nevus 01/17/2009   right thigh, slight atypia    Fibrocystic breast disease    GERD (gastroesophageal reflux disease)    Hyperlipidemia    Menopause    Osteoporosis    Reflux esophagitis    RLS (restless legs syndrome)    Urinary dysfunction     PAST SURGICAL HISTORY:   Past Surgical History:  Procedure Laterality Date   BREAST CYST ASPIRATION Left    BREAST EXCISIONAL BIOPSY Left 1980's   neg   CATARACT EXTRACTION     EYE SURGERY     left   KYPHOPLASTY N/A 08/19/2016   Procedure: KYPHOPLASTY L3,L4;  Surgeon: Hessie Knows, MD;  Location: [facility] ORS;  Service: Orthopedics;  Laterality: N/A;   KYPHOPLASTY N/A 09/21/2016   Procedure: [id number];  Surgeon: Hessie Knows, MD;  Location: [facility] ORS;  Service: Orthopedics;  Laterality: N/A;   MOUTH SURGERY     POLYPECTOMY     SLING BLADE PROCEDURE     TOTAL ABDOMINAL HYSTERECTOMY      SOCIAL HISTORY:   Social History   Tobacco Use   Smoking status: Never   Smokeless tobacco: Never  Substance Use Topics   Alcohol use: No    Alcohol/week: 0.0 standard drinks of alcohol    FAMILY HISTORY:   Family History  Problem Relation Age of Onset   Transient ischemic attack Mother    Atrial fibrillation Mother    Arthritis Mother  OA   Heart disease Father        CHF   CAD Father    Hyperlipidemia Sister    Arthritis Sister        OA   Drug abuse Son    Hypertension Son    Stroke Sister        Lived in a nursing home and died from COVID-39   Arthritis Sister        OA   Heart failure Other    Coronary artery disease Other    Stroke Other    Hyperlipidemia Other    Hypertension Other    Thyroid disease Other    Breast cancer Maternal Aunt    Breast cancer Paternal Aunt     DRUG ALLERGIES:   Allergies  Allergen Reactions   Alendronate     Dyspepsia, inflamed esophagus    Levofloxacin Hives   Nitrofurantoin Monohyd Macro Hives   Sulfamethoxazole-Trimethoprim Hives   Etodolac Hives    Penicillins Hives and Rash    Has patient had a PCN reaction causing immediate rash, facial/tongue/throat swelling, SOB or lightheadedness with hypotension: No Has patient had a PCN reaction causing severe rash involving mucus membranes or skin necrosis: No Has patient had a PCN reaction that required hospitalization: No Has patient had a PCN reaction occurring within the last 10 years: No If all of the above answers are "NO", then may proceed with Cephalosporin use.     REVIEW OF SYSTEMS:   ROS As per history of present illness. All pertinent systems were reviewed above. Constitutional, HEENT, cardiovascular, respiratory, GI, GU, musculoskeletal, neuro, psychiatric, endocrine, integumentary and hematologic systems were reviewed and are otherwise negative/unremarkable except for positive findings mentioned above in the HPI.   MEDICATIONS AT HOME:   Prior to Admission medications   Medication Sig Start Date End Date Taking? Authorizing Provider  aspirin 81 MG EC tablet Take 1 tablet (81 mg total) by mouth daily. Swallow whole. 04/13/21  Yes Sowles, Drue Stager, MD  atorvastatin (LIPITOR) 40 MG tablet TAKE 1 TABLET(40 MG) BY MOUTH DAILY 09/11/21  Yes Sowles, Drue Stager, MD  Cholecalciferol (VITAMIN D) 2000 units CAPS Take 1 capsule by mouth daily.   Yes [provider]  levothyroxine (SYNTHROID) 25 MCG tablet TAKE 1 TO 2 TABLETS BY MOUTH DAILY BEFORE BREAKFAST. ALTERNATE EVERY OTHER DAY 10/23/21  Yes Sowles, Drue Stager, MD  Magnesium 400 MG TABS Take 1 tablet by mouth daily.   Yes [provider]  omeprazole (PRILOSEC) 40 MG capsule TAKE 1 CAPSULE(40 MG) BY MOUTH DAILY 07/10/21  Yes Sowles, Drue Stager, MD  vitamin B-12 (CYANOCOBALAMIN) 1000 MCG tablet Take 1,000 mcg by mouth 3 (three) times a week.   Yes [provider]  calcium carbonate (TUMS EX) 750 MG chewable tablet Chew 1 tablet by mouth daily.    [provider]  denosumab (PROLIA) 60 MG/ML SOSY injection Inject 60  mg into the skin every 6 (six) months.    [provider]  fexofenadine (ALLEGRA ALLERGY) 180 MG tablet Take 1 tablet (180 mg total) by mouth daily. 05/05/20   Steele Sizer, MD      VITAL SIGNS:  Blood pressure 138/68, pulse 78, temperature 98.3 F (36.8 C), temperature source Oral, resp. rate 19, height '5\' 2"'$  (1.575 m), weight 48.1 kg, SpO2 100 %.  PHYSICAL EXAMINATION:  Physical Exam  GENERAL:  76 y.o.-year-old patient lying in the bed with no acute distress.  EYES: Pupils equal, round, reactive to light and accommodation. No scleral  icterus. Extraocular muscles intact.  HEENT: Head atraumatic, normocephalic. Oropharynx and nasopharynx clear.  NECK:  Supple, no jugular venous distention. No thyroid enlargement, no tenderness.  LUNGS: Normal breath sounds bilaterally, no wheezing, rales,rhonchi or crepitation. No use of accessory muscles of respiration.  CARDIOVASCULAR: Regular rate and rhythm, S1, S2 normal. No murmurs, rubs, or gallops.  ABDOMEN: Soft, nondistended, nontender. Bowel sounds present. No organomegaly or mass.  EXTREMITIES: No pedal edema, cyanosis, or clubbing.  NEUROLOGIC: Cranial nerves II through XII are intact. Muscle strength 5/5 in all extremities. Sensation intact. Gait not checked.  PSYCHIATRIC: The patient is alert and oriented x 3.  Normal affect and good eye contact. SKIN: No obvious rash, lesion, or ulcer.   LABORATORY PANEL:   CBC Recent Labs  Lab 10/24/21 1813  WBC 7.3  HGB 12.2  HCT 37.1  PLT 267   ------------------------------------------------------------------------------------------------------------------  Chemistries  Recent Labs  Lab 10/24/21 1813  NA 137  K 4.0  CL 105  CO2 27  GLUCOSE 116*  BUN 20  CREATININE 0.81  CALCIUM 9.1  AST 27  ALT 18  ALKPHOS 49  BILITOT 0.5   ------------------------------------------------------------------------------------------------------------------  Cardiac Enzymes No  results for input(s): "TROPONINI" in the last 168 hours. ------------------------------------------------------------------------------------------------------------------  RADIOLOGY:  CT HEAD CODE STROKE WO CONTRAST  Result Date: 10/24/2021 CLINICAL DATA:  Code stroke. Acute onset of right-sided numbness beginning 1 hour ago. EXAM: CT HEAD WITHOUT CONTRAST TECHNIQUE: Contiguous axial images were obtained from the base of the skull through the vertex without intravenous contrast. RADIATION DOSE REDUCTION: This exam was performed according to the departmental dose-optimization program which includes automated exposure control, adjustment of the mA and/or kV according to patient size and/or use of iterative reconstruction technique. COMPARISON:  MR head without contrast 03/19/2021. CT head without contrast 03/19/2021. FINDINGS: Brain: Mild atrophy and moderate diffuse white matter changes are present bilaterally. Periventricular and subcortical white matter hypoattenuation is most prominent anteriorly. No acute infarct, hemorrhage, or mass lesion is present. Basal ganglia are intact. Insular ribbon is normal bilaterally. The ventricles are of normal size. No significant extraaxial fluid collection is present. Insert normal brainstem Vascular: Atherosclerotic calcifications are present within the cavernous internal carotid arteries. No hyperdense vessel is present. Skull: Calvarium is intact. No focal lytic or blastic lesions are present. No significant extracranial soft tissue lesion is present. Sinuses/Orbits: The paranasal sinuses and mastoid air cells are clear. Bilateral lens replacements are noted. Globes and orbits are otherwise unremarkable. ASPECTS [facility] Stroke Program Early CT Score) - Ganglionic level infarction (caudate, lentiform nuclei, internal capsule, insula, M1-M3 cortex): 7/7 - Supraganglionic infarction (M4-M6 cortex): 3/3 Total score (0-10 with 10 being normal): 10/10 IMPRESSION: 1. No  acute intracranial abnormality. 2. Mild atrophy and moderate diffuse white matter disease. This likely reflects the sequela of chronic microvascular ischemia. 3. ASPECTS is 10/10. * The above was relayed via text pager to Dr. Leonel Ramsay on 10/24/2021 at 18:34 . Electronically Signed   By: San Morelle M.D.   On: 10/24/2021 18:35      IMPRESSION AND PLAN:  Assessment and Plan: * TIA (transient ischemic attack) - This is manifested by right upper extremity and facial numbness, almost resolving. - The patient will be admitted to a medical telemetry observation bed. - We will continue aspirin and Plavix. - She will be placed on statin therapy and will check fasting lipids. - Neurochecks will be followed. - Neurology consult was obtained on follow-up will be provided. - Dr. Leonel Ramsay is aware about the  patient. - PT/OT and ST consults will be obtained.  GERD without esophagitis - We will continue PPI therapy.  Dyslipidemia - We will continue statin therapy.  B12 deficiency - We will continue vitamin B12.  Acquired hypothyroidism - We will continue Synthroid.    DVT prophylaxis: Lovenox. Advanced Care Planning:  Code Status: DNR/DNI.  This was discussed with her. Family Communication:  The plan of care was discussed in details with the patient (and family). I answered all questions. The patient agreed to proceed with the above mentioned plan. Further management will depend upon hospital course. Disposition Plan: Back to previous home environment Consults called: Neurology. All the records are reviewed and case discussed with ED provider.  Status is: Observation  I certify that at the time of admission, it is my clinical judgment that the patient will require hospital care extending less than 6 than 2 midnights.                            Dispo: The patient is from: Home              Anticipated d/c is to: Home              Patient currently is not medically stable to  d/c.              Difficult to place patient: No  Christel Mormon M.D on 10/24/2021 at 8:44 PM  [facility]   From 7 PM-7 AM, contact night-coverage [email or web address]  CC: Primary care physician; Steele Sizer, MD

## 2021-10-24 NOTE — Progress Notes (Signed)
CODE STROKE- PHARMACY COMMUNICATION   Time CODE STROKE called/page received:1821  Time response to CODE STROKE was made (in person or via phone): [place]  Call x 2  Time Stroke Kit retrieved from [place] (only if needed):   Name of Provider/Nurse contacted:   Past Medical History:  Diagnosis Date   Allergy    Back pain    Basal cell carcinoma ~2000   L brow, treated with MOHs   Dyslipidemia    Dysplastic nevus 01/17/2009   right thigh, slight atypia    Fibrocystic breast disease    GERD (gastroesophageal reflux disease)    Hyperlipidemia    Menopause    Osteoporosis    Reflux esophagitis    RLS (restless legs syndrome)    Urinary dysfunction    Prior to Admission medications   Medication Sig Start Date End Date Taking? Authorizing Provider  aspirin 81 MG EC tablet Take 1 tablet (81 mg total) by mouth daily. Swallow whole. 04/13/21  Yes Sowles, Drue Stager, MD  atorvastatin (LIPITOR) 40 MG tablet TAKE 1 TABLET(40 MG) BY MOUTH DAILY 09/11/21  Yes Sowles, Drue Stager, MD  Cholecalciferol (VITAMIN D) 2000 units CAPS Take 1 capsule by mouth daily.   Yes [provider]  levothyroxine (SYNTHROID) 25 MCG tablet TAKE 1 TO 2 TABLETS BY MOUTH DAILY BEFORE BREAKFAST. ALTERNATE EVERY OTHER DAY 10/23/21  Yes Sowles, Drue Stager, MD  Magnesium 400 MG TABS Take 1 tablet by mouth daily.   Yes [provider]  omeprazole (PRILOSEC) 40 MG capsule TAKE 1 CAPSULE(40 MG) BY MOUTH DAILY 07/10/21  Yes Sowles, Drue Stager, MD  vitamin B-12 (CYANOCOBALAMIN) 1000 MCG tablet Take 1,000 mcg by mouth 3 (three) times a week.   Yes [provider]  calcium carbonate (TUMS EX) 750 MG chewable tablet Chew 1 tablet by mouth daily.    [provider]  denosumab (PROLIA) 60 MG/ML SOSY injection Inject 60 mg into the skin every 6 (six) months.    [provider]  fexofenadine (ALLEGRA ALLERGY) 180 MG tablet Take 1 tablet (180 mg total) by mouth daily. 05/05/20   Steele Sizer, MD     Noralee Space ,PharmD Clinical Pharmacist  10/24/2021  7:11 PM

## 2021-10-24 NOTE — Assessment & Plan Note (Signed)
-   We will continue Synthroid. 

## 2021-10-24 NOTE — Progress Notes (Signed)
  Chaplain On-Call responded to Code Stroke notification at 1820 hours to ED room 8.  The patient was being attended by medical team at bedside, and also via Video Conference with Physician.  Chaplain is available for additional support if requested.  Chaplain Pollyann Samples M.Div., [facility]

## 2021-10-25 ENCOUNTER — Observation Stay
Admit: 2021-10-25 | Discharge: 2021-10-25 | Disposition: A | Discharge status: Home / Self Care | Payer: Medicare Other | Attending: Family Medicine | Admitting: Family Medicine

## 2021-10-25 DIAGNOSIS — G459 Transient cerebral ischemic attack, unspecified: Secondary | ICD-10-CM | POA: Diagnosis not present

## 2021-10-25 DIAGNOSIS — I629 Nontraumatic intracranial hemorrhage, unspecified: Secondary | ICD-10-CM | POA: Diagnosis not present

## 2021-10-25 DIAGNOSIS — I6359 Cerebral infarction due to unspecified occlusion or stenosis of other cerebral artery: Secondary | ICD-10-CM | POA: Diagnosis not present

## 2021-10-25 DIAGNOSIS — R200 Anesthesia of skin: Secondary | ICD-10-CM | POA: Diagnosis not present

## 2021-10-25 LAB — LIPID PANEL
Cholesterol: 176 mg/dL (ref 0–200)
HDL: 85 mg/dL (ref >40)
LDL Cholesterol: 83 mg/dL (ref 0–99)
Total CHOL/HDL Ratio: 2.1 RATIO
Triglycerides: 38 mg/dL (ref <150)
VLDL: 8 mg/dL (ref 0–40)

## 2021-10-25 LAB — ECHOCARDIOGRAM COMPLETE BUBBLE STUDY
AR max vel: 1.91 cm2
AV Peak grad: 7.2 mmHg
Ao pk vel: 1.34 m/s
Area-P 1/2: 3.48 cm2
Calc EF: 63.7 %
S' Lateral: 2.64 cm
Single Plane A2C EF: 64 %
Single Plane A4C EF: 62.5 %

## 2021-10-25 LAB — CBC
HCT: 33.9 % — ABNORMAL LOW (ref 36.0–46.0)
Hemoglobin: 11.2 g/dL — ABNORMAL LOW (ref 12.0–15.0)
MCH: 32.1 pg (ref 26.0–34.0)
MCHC: 33 g/dL (ref 30.0–36.0)
MCV: 97.1 fL (ref 80.0–100.0)
Platelets: 207 10*3/uL (ref 150–400)
RBC: 3.49 MIL/uL — ABNORMAL LOW (ref 3.87–5.11)
RDW: 12.8 % (ref 11.5–15.5)
WBC: 5.2 10*3/uL (ref 4.0–10.5)
nRBC: 0 % (ref 0.0–0.2)

## 2021-10-25 LAB — HEMOGLOBIN A1C
Hgb A1c MFr Bld: 5.4 % (ref 4.8–5.6)
Mean Plasma Glucose: 108.28 mg/dL

## 2021-10-25 LAB — BASIC METABOLIC PANEL
Anion gap: 7 (ref 5–15)
BUN: 16 mg/dL (ref 8–23)
CO2: 26 mmol/L (ref 22–32)
Calcium: 8.5 mg/dL — ABNORMAL LOW (ref 8.9–10.3)
Chloride: 108 mmol/L (ref 98–111)
Creatinine, Ser: 0.69 mg/dL (ref 0.44–1.00)
GFR, Estimated: >60 mL/min (ref >60)
Glucose, Bld: 83 mg/dL (ref 70–99)
Potassium: 3.7 mmol/L (ref 3.5–5.1)
Sodium: 141 mmol/L (ref 135–145)

## 2021-10-25 MED ORDER — ATORVASTATIN CALCIUM 80 MG PO TABS: 80.0000 mg | ORAL_TABLET | Freq: Every evening | ORAL | 2 refills | Status: DC

## 2021-10-25 MED ORDER — CLOPIDOGREL BISULFATE 75 MG PO TABS: 75.0000 mg | ORAL_TABLET | Freq: Every day | ORAL | 0 refills | Status: DC

## 2021-10-25 MED ORDER — ATORVASTATIN CALCIUM 20 MG PO TABS: 80.0000 mg | ORAL_TABLET | Freq: Every evening | ORAL | Status: DC

## 2021-10-25 NOTE — Progress Notes (Signed)
*  PRELIMINARY RESULTS* Echocardiogram 2D Echocardiogram has been performed. A Bubble Study (Saline Microcavitation) was requested and performed on this study.  Claretta Fraise 10/25/2021, 11:23 AM

## 2021-10-25 NOTE — Progress Notes (Signed)
Subjective: Completely resolved  Exam: Vitals:   10/25/21 0452 10/25/21 0700  BP:  (!) 148/74  Pulse:  61  Resp:  13  Temp: 97.9 F (36.6 C)   SpO2:  100%   Gen: In bed, NAD Resp: non-labored breathing, no acute distress   Neuro: MS: Awake, alert, interactive and appropriate CN: Visual field full, EOMI, face symmetric Motor: 5/5 throughout Sensory: Intact to light touch  Pertinent Labs: LDL 83 A1c 5.4  Impression: 76 year old female who was only intermittently taking aspirin previously who presents with right-sided numbness of the arm and face which is completely resolved.  This is most consistent with transient ischemic attack.  MRI also reveals old ischemic stroke.  She was only intermittently taking aspirin prior to this.  Recommendations: 1) aspirin 81 mg daily and Plavix 75 mg daily for 3 weeks followed by aspirin monotherapy 2) increase atorvastatin to 80 mg nightly 3) follow-up echocardiogram results, as long as there are no surprises, patient could be discharged from my perspective.  Roland Rack, MD [facility] [phone]  If 7pm- 7am, please page neurology on call as listed in [place].

## 2021-10-25 NOTE — Progress Notes (Signed)
SLP Cancellation Note  Patient Details Name: Heather Singh MRN: [id number] DOB: 01-13-1946   Cancelled treatment:       Reason Eval/Treat Not Completed: SLP screened, no needs identified, will sign off   SLP consult received and appreciated. Chart review completed. CT and MRI negative for acute stroke. NIH Stroke Scale = 0. Consulted with RN. No current concerns regarding speech/language/cognition or swallowing.   Cherrie Gauze, M.S., [facility] [phone] [place])   Quintella Baton 10/25/2021, 8:32 AM

## 2021-10-25 NOTE — ED Notes (Signed)
Assumed care, meds given per MAR. Pt up with no assistance. Steady gate. Reports feeling her baseline. Will monitor.

## 2021-10-25 NOTE — Discharge Summary (Signed)
Physician Discharge Summary   Patient: Heather Singh MRN: [id number] DOB: Apr 17, 1945  Admit date:     10/24/2021  Discharge date: 10/25/21  Discharge Physician: Fritzi Mandes   PCP: Steele Sizer, MD   Discharge Diagnoses: Principal Problem:   TIA (transient ischemic attack) Active Problems:   Acquired hypothyroidism   B12 deficiency   Dyslipidemia   GERD without esophagitis   Hospital Course:  Heather Singh is a 76 y.o. female with medical history significant for dyslipidemia, fibrocystic breast disease, GERD, hypertension lipidemia, GERD and RLS, who presented to the emergency room with acute onset of right arm and hand as well as facial numbness that started around 5:30 PM with right arm numbness.  Neurology consult was obtained by Dr. Leonel Ramsay.  TIA (transient ischemic attack) -  manifested by right upper extremity and facial numbness, almost resolving. - continue aspirin and Plavix (for 3 weeks) - increased lipitor 80 mg qhs - Dr. Leonel Ramsay recommended above rx - PT/OT --no needs. Pt is at baseline --echo looks ok   GERD without esophagitis - continue PPI therapy.   Dyslipidemia -  continue statin therapy.   B12 deficiency -  continue vitamin B12.   Acquired hypothyroidism - continue Synthroid.  Overall stable D/w pt d/c plans. F/u dr Ancil Boozer as out pt      Consultants: Neurology Procedures performed: none  Disposition: Home Diet recommendation:  Discharge Diet Orders (From admission, onward)     Start     Ordered   10/25/21 0000  Diet - low sodium heart healthy        10/25/21 1334           Cardiac diet DISCHARGE MEDICATION: Allergies as of 10/25/2021       Reactions   Alendronate    Dyspepsia, inflamed esophagus    Levofloxacin Hives   Nitrofurantoin Monohyd Macro Hives   Sulfamethoxazole-trimethoprim Hives   Etodolac Hives   Penicillins Hives, Rash   Has patient had a PCN reaction causing immediate rash, facial/tongue/throat  swelling, SOB or lightheadedness with hypotension: No Has patient had a PCN reaction causing severe rash involving mucus membranes or skin necrosis: No Has patient had a PCN reaction that required hospitalization: No Has patient had a PCN reaction occurring within the last 10 years: No If all of the above answers are "NO", then may proceed with Cephalosporin use.        Medication List     TAKE these medications    aspirin EC 81 MG tablet Take 1 tablet (81 mg total) by mouth daily. Swallow whole.   atorvastatin 80 MG tablet Commonly known as: LIPITOR Take 1 tablet (80 mg total) by mouth every evening. What changed:  medication strength See the new instructions.   calcium carbonate 750 MG chewable tablet Commonly known as: TUMS EX Chew 1 tablet by mouth daily.   clopidogrel 75 MG tablet Commonly known as: PLAVIX Take 1 tablet (75 mg total) by mouth daily. Start taking on: October 26, 2021   cyanocobalamin 1000 MCG tablet Commonly known as: VITAMIN B12 Take 1,000 mcg by mouth 3 (three) times a week.   denosumab 60 MG/ML Sosy injection Commonly known as: PROLIA Inject 60 mg into the skin every 6 (six) months.   fexofenadine 180 MG tablet Commonly known as: Allegra Allergy Take 1 tablet (180 mg total) by mouth daily.   levothyroxine 25 MCG tablet Commonly known as: SYNTHROID TAKE 1 TO 2 TABLETS BY MOUTH DAILY BEFORE BREAKFAST. ALTERNATE EVERY OTHER  DAY   Magnesium 400 MG Tabs Take 1 tablet by mouth daily.   omeprazole 40 MG capsule Commonly known as: PRILOSEC TAKE 1 CAPSULE(40 MG) BY MOUTH DAILY   Vitamin D 50 MCG (2000 UT) Caps Take 1 capsule by mouth daily.        Follow-up Information     Steele Sizer, MD. Call in 1 week(s).   Specialty: Family Medicine Why: for CVA f/u Contact information: [address] [place] [phone]                Discharge Exam: Danley Danker Weights   10/24/21 1810  Weight: 48.1 kg      Condition at discharge: fair  The results of significant diagnostics from this hospitalization (including imaging, microbiology, ancillary and laboratory) are listed below for reference.   Imaging Studies: ECHOCARDIOGRAM COMPLETE BUBBLE STUDY  Result Date: 10/25/2021    ECHOCARDIOGRAM REPORT   Patient Name:   Heather Singh Date of Exam: 10/25/2021 Medical Rec #:  [id number]       Height:       62.0 in Accession #:    [id number]      Weight:       106.0 lb Date of Birth:  [birth year]       BSA:          1.460 m Patient Age:    36 years        BP:           132/69 mmHg Patient Gender: F               HR:           64 bpm. Exam Location:  [facility] Procedure: 2D Echo and Saline Contrast Bubble Study Indications:     Stroke I63.9  History:         Patient has no prior history of Echocardiogram examinations.  Sonographer:     Kathlen Brunswick RDCS Referring Phys:  [id number] [place] Diagnosing Phys: Serafina Royals MD IMPRESSIONS  1. Left ventricular ejection fraction, by estimation, is 60 to 65%. The left ventricle has normal function. The left ventricle has no regional wall motion abnormalities. Left ventricular diastolic parameters were normal.  2. Right ventricular systolic function is normal. The right ventricular size is normal.  3. The mitral valve is normal in structure. Mild mitral valve regurgitation.  4. The aortic valve is normal in structure. Aortic valve regurgitation is not visualized.  5. Agitated saline contrast bubble study was negative, with no evidence of any interatrial shunt. FINDINGS  Left Ventricle: Left ventricular ejection fraction, by estimation, is 60 to 65%. The left ventricle has normal function. The left ventricle has no regional wall motion abnormalities. The left ventricular internal cavity size was normal in size. There is  no left ventricular hypertrophy. Left ventricular diastolic parameters were normal. Right Ventricle: The right ventricular size is normal. No increase in  right ventricular wall thickness. Right ventricular systolic function is normal. Left Atrium: Left atrial size was normal in size. Right Atrium: Right atrial size was normal in size. Pericardium: There is no evidence of pericardial effusion. Mitral Valve: The mitral valve is normal in structure. Mild mitral valve regurgitation. Tricuspid Valve: The tricuspid valve is normal in structure. Tricuspid valve regurgitation is mild. Aortic Valve: The aortic valve is normal in structure. Aortic valve regurgitation is not visualized. Aortic valve peak gradient measures 7.2 mmHg. Pulmonic Valve: The pulmonic valve was normal in structure. Pulmonic  valve regurgitation is not visualized. Aorta: The aortic root and ascending aorta are structurally normal, with no evidence of dilitation. IAS/Shunts: No atrial level shunt detected by color flow Doppler. Agitated saline contrast was given intravenously to evaluate for intracardiac shunting. Agitated saline contrast bubble study was negative, with no evidence of any interatrial shunt.  LEFT VENTRICLE PLAX 2D LVIDd:         3.91 cm     Diastology LVIDs:         2.64 cm     LV e' medial:    8.16 cm/s LV PW:         0.76 cm     LV E/e' medial:  6.9 LV IVS:        0.79 cm     LV e' lateral:   9.25 cm/s LVOT diam:     1.90 cm     LV E/e' lateral: 6.1 LV SV:         57 LV SV Index:   39 LVOT Area:     2.84 cm  LV Volumes (MOD) LV vol d, MOD A2C: 61.7 ml LV vol d, MOD A4C: 53.9 ml LV vol s, MOD A2C: 22.2 ml LV vol s, MOD A4C: 20.2 ml LV SV MOD A2C:     39.5 ml LV SV MOD A4C:     53.9 ml LV SV MOD BP:      37.3 ml RIGHT VENTRICLE RV Basal diam:  2.53 cm RV S prime:     16.00 cm/s TAPSE (M-mode): 2.7 cm LEFT ATRIUM           Index        RIGHT ATRIUM           Index LA diam:      3.40 cm 2.33 cm/m   RA Area:     13.70 cm LA Vol (A2C): 33.4 ml 22.88 ml/m  RA Volume:   35.00 ml  23.98 ml/m LA Vol (A4C): 28.4 ml 19.46 ml/m  AORTIC VALVE AV Area (Vmax): 1.91 cm AV Vmax:        134.00 cm/s  AV Peak Grad:   7.2 mmHg LVOT Vmax:      90.10 cm/s LVOT Vmean:     56.700 cm/s LVOT VTI:       0.201 m  AORTA Ao Root diam: 2.80 cm Ao Asc diam:  2.70 cm MITRAL VALVE               TRICUSPID VALVE MV Area (PHT): 3.48 cm    TV Peak grad:   19.0 mmHg MV Decel Time: 218 msec    TV Vmax:        2.18 m/s MV E velocity: 56.10 cm/s MV A velocity: 75.40 cm/s  SHUNTS MV E/A ratio:  0.74        Systemic VTI:  0.20 m                            Systemic Diam: 1.90 cm Serafina Royals MD Electronically signed by Serafina Royals MD Signature Date/Time: 10/25/2021/1:23:48 PM    Final    MR BRAIN WO CONTRAST  Result Date: 10/25/2021 CLINICAL DATA:  Initial evaluation for acute TIA. EXAM: MRI HEAD WITHOUT CONTRAST TECHNIQUE: Multiplanar, multiecho pulse sequences of the brain and surrounding structures were obtained without intravenous contrast. COMPARISON:  Prior CT from 10/24/2021 and MRI from 03/19/2021. FINDINGS: Brain: Cerebral volume within normal limits for age.  Patchy T2/FLAIR hyperintensity about the ventricular deep white matter both cerebral hemispheres, most consistent with chronic small vessel ischemic disease, moderately advanced in nature. Remote lacunar infarct present at the right thalamus. No evidence for acute or subacute ischemia. Gray-white matter distension maintained. No areas of chronic cortical infarction. No acute or chronic intracranial hemorrhage. No mass lesion, midline shift or mass effect. No hydrocephalus or extra-axial fluid collection. Pituitary gland and suprasellar region within normal limits. Vascular: Major intracranial vascular flow voids are maintained. Skull and upper cervical spine: Craniocervical junction normal. Bone marrow signal intensity within normal limits. No scalp soft tissue abnormality. Sinuses/Orbits: Prior bilateral ocular lens replacement. Globes low-flow tissues demonstrate no acute finding. Scattered mucosal thickening noted about the ethmoidal air cells. Paranasal sinuses  are otherwise clear. No significant mastoid effusion. Other: None. IMPRESSION: 1. No acute intracranial abnormality. 2. Moderately advanced chronic microvascular ischemic disease. Associated small remote lacunar infarct at the right thalamus. Electronically Signed   By: Jeannine Boga M.D.   On: 10/25/2021 00:57   CT ANGIO HEAD NECK W WO CM  Result Date: 10/24/2021 CLINICAL DATA:  Initial evaluation for neuro deficit, stroke suspected, right-sided numbness. EXAM: CT ANGIOGRAPHY HEAD AND NECK TECHNIQUE: Multidetector CT imaging of the head and neck was performed using the standard protocol during bolus administration of intravenous contrast. Multiplanar CT image reconstructions and MIPs were obtained to evaluate the vascular anatomy. Carotid stenosis measurements (when applicable) are obtained utilizing NASCET criteria, using the distal internal carotid diameter as the denominator. RADIATION DOSE REDUCTION: This exam was performed according to the departmental dose-optimization program which includes automated exposure control, adjustment of the mA and/or kV according to patient size and/or use of iterative reconstruction technique. CONTRAST:  40m OMNIPAQUE IOHEXOL 350 MG/ML SOLN COMPARISON:  CT from earlier the same day as well as previous CTA from 03/19/2021. FINDINGS: CTA NECK FINDINGS Aortic arch: Visualized aortic arch normal caliber with normal branch pattern. Atheromatous change about the arch itself. No stenosis about the origin the great vessels. Right carotid system: Right common and internal carotid arteries patent without dissection or other acute finding. Mild atheromatous change about the right carotid bulb without stenosis. Mild irregularity about the cervical right ICA could be related to chronic hypertension or possibly mild changes of FMD. Left carotid system: Left common and internal carotid arteries patent without dissection or other acute finding. Mild atheromatous change about the  left carotid bulb without significant stenosis. Irregularity about the cervical left ICA could be related to chronic hypertension or possibly mild changes of FMD. Vertebral arteries: Both vertebral arteries arise from subclavian arteries. No proximal subclavian artery stenosis. Left vertebral artery dominant. Vertebral arteries patent without stenosis or dissection. Skeleton: No discrete or worrisome osseous lesions. Other neck: No other acute soft tissue abnormality within the neck. Upper chest: Irregular biapical pleuroparenchymal scarring noted. Visualized upper chest demonstrates no acute finding. Review of the MIP images confirms the above findings CTA HEAD FINDINGS Anterior circulation: Both internal carotid arteries patent to the termini without significant stenosis. Mild atheromatous change within the carotid siphons noted. A1 segments patent bilaterally. Normal anterior communicating artery complex. Anterior cerebral arteries patent without stenosis. No M1 stenosis or occlusion. No proximal MCA branch occlusion. Distal MCA branches perfused and symmetric. Posterior circulation: Both vertebral arteries patent without stenosis. Both PICA patent. Basilar patent to its distal aspect without stenosis. Superior cerebellar and posterior cerebral arteries patent bilaterally. Venous sinuses: Patent allowing for timing the contrast bolus. Anatomic variants: None significant.  No aneurysm.  Review of the MIP images confirms the above findings IMPRESSION: 1. Negative CTA for large vessel occlusion or other margin finding. 2. Mild atheromatous change about the carotid bifurcations and carotid siphons without hemodynamically significant stenosis. 3. Mild irregularity about the distal cervical ICAs bilaterally, favored to be related to chronic hypertension, although mild changes of FMD could also be considered. Electronically Signed   By: Jeannine Boga M.D.   On: 10/24/2021 21:00   CT HEAD CODE STROKE WO  CONTRAST  Result Date: 10/24/2021 CLINICAL DATA:  Code stroke. Acute onset of right-sided numbness beginning 1 hour ago. EXAM: CT HEAD WITHOUT CONTRAST TECHNIQUE: Contiguous axial images were obtained from the base of the skull through the vertex without intravenous contrast. RADIATION DOSE REDUCTION: This exam was performed according to the departmental dose-optimization program which includes automated exposure control, adjustment of the mA and/or kV according to patient size and/or use of iterative reconstruction technique. COMPARISON:  MR head without contrast 03/19/2021. CT head without contrast 03/19/2021. FINDINGS: Brain: Mild atrophy and moderate diffuse white matter changes are present bilaterally. Periventricular and subcortical white matter hypoattenuation is most prominent anteriorly. No acute infarct, hemorrhage, or mass lesion is present. Basal ganglia are intact. Insular ribbon is normal bilaterally. The ventricles are of normal size. No significant extraaxial fluid collection is present. Insert normal brainstem Vascular: Atherosclerotic calcifications are present within the cavernous internal carotid arteries. No hyperdense vessel is present. Skull: Calvarium is intact. No focal lytic or blastic lesions are present. No significant extracranial soft tissue lesion is present. Sinuses/Orbits: The paranasal sinuses and mastoid air cells are clear. Bilateral lens replacements are noted. Globes and orbits are otherwise unremarkable. ASPECTS [facility] Stroke Program Early CT Score) - Ganglionic level infarction (caudate, lentiform nuclei, internal capsule, insula, M1-M3 cortex): 7/7 - Supraganglionic infarction (M4-M6 cortex): 3/3 Total score (0-10 with 10 being normal): 10/10 IMPRESSION: 1. No acute intracranial abnormality. 2. Mild atrophy and moderate diffuse white matter disease. This likely reflects the sequela of chronic microvascular ischemia. 3. ASPECTS is 10/10. * The above was relayed via text  pager to Dr. Leonel Ramsay on 10/24/2021 at 18:34 . Electronically Signed   By: San Morelle M.D.   On: 10/24/2021 18:35    Microbiology: Results for orders placed or performed during the hospital encounter of 03/19/21  Resp Panel by RT-PCR (Flu A&B, Covid) Nasopharyngeal Swab     Status: None   Collection Time: 03/19/21  7:40 PM   Specimen: Nasopharyngeal Swab; Nasopharyngeal(NP) swabs in vial transport medium  Result Value Ref Range Status   SARS Coronavirus 2 by RT PCR NEGATIVE NEGATIVE Final    Comment: (NOTE) SARS-CoV-2 target nucleic acids are NOT DETECTED.  The SARS-CoV-2 RNA is generally detectable in upper respiratory specimens during the acute phase of infection. The lowest concentration of SARS-CoV-2 viral copies this assay can detect is 138 copies/mL. A negative result does not preclude SARS-Cov-2 infection and should not be used as the sole basis for treatment or other patient management decisions. A negative result may occur with  improper specimen collection/handling, submission of specimen other than nasopharyngeal swab, presence of viral mutation(s) within the areas targeted by this assay, and inadequate number of viral copies(<138 copies/mL). A negative result must be combined with clinical observations, patient history, and epidemiological information. The expected result is Negative.  Fact Sheet for Patients:  EntrepreneurPulse.com.au  Fact Sheet for Healthcare Providers:  IncredibleEmployment.be  This test is no t yet approved or cleared by the [place] FDA and  has  been authorized for detection and/or diagnosis of SARS-CoV-2 by FDA under an Emergency Use Authorization (EUA). This EUA will remain  in effect (meaning this test can be used) for the duration of the COVID-19 declaration under Section 564(b)(1) of the Act, 21 U.S.C.section 360bbb-3(b)(1), unless the authorization is terminated  or revoked sooner.        Influenza A by PCR NEGATIVE NEGATIVE Final   Influenza B by PCR NEGATIVE NEGATIVE Final    Comment: (NOTE) The Xpert Xpress SARS-CoV-2/FLU/RSV plus assay is intended as an aid in the diagnosis of influenza from Nasopharyngeal swab specimens and should not be used as a sole basis for treatment. Nasal washings and aspirates are unacceptable for Xpert Xpress SARS-CoV-2/FLU/RSV testing.  Fact Sheet for Patients: EntrepreneurPulse.com.au  Fact Sheet for Healthcare Providers: IncredibleEmployment.be  This test is not yet approved or cleared by the [place] FDA and has been authorized for detection and/or diagnosis of SARS-CoV-2 by FDA under an Emergency Use Authorization (EUA). This EUA will remain in effect (meaning this test can be used) for the duration of the COVID-19 declaration under Section 564(b)(1) of the Act, 21 U.S.C. section 360bbb-3(b)(1), unless the authorization is terminated or revoked.  Performed at [facility], [address] [place], [place] [address]     Labs: CBC: Recent Labs  Lab 10/24/21 1813 10/25/21 0501  WBC 7.3 5.2  NEUTROABS 4.0  --   HGB 12.2 11.2*  HCT 37.1 33.9*  MCV 97.4 97.1  PLT 267 680   Basic Metabolic Panel: Recent Labs  Lab 10/24/21 1813 10/25/21 0501  NA 137 141  K 4.0 3.7  CL 105 108  CO2 27 26  GLUCOSE 116* 83  BUN 20 16  CREATININE 0.81 0.69  CALCIUM 9.1 8.5*   Liver Function Tests: Recent Labs  Lab 10/24/21 1813  AST 27  ALT 18  ALKPHOS 49  BILITOT 0.5  PROT 7.4  ALBUMIN 4.5   CBG: Recent Labs  Lab 10/24/21 1807  GLUCAP 116*    Discharge time spent: greater than 30 minutes.  Signed: Fritzi Mandes, MD [facility] 10/25/2021

## 2021-10-25 NOTE — Progress Notes (Signed)
OT Cancellation Note  Patient Details Name: Heather Singh MRN: [id number] DOB: 1945-09-08   Cancelled Treatment:    Reason Eval/Treat Not Completed: OT screened, no needs identified, will sign off. Order received, chart reviewed. Per consult with nurse and PT, pt is back at baseline level of fxl mobility, is aware of stroke signs/symptoms and need for immediate medical care, has no rehab needs at present. Will sign off.  Josiah Lobo, PhD, MS, OTR/L 10/25/21, 10:41 AM

## 2021-10-25 NOTE — Evaluation (Signed)
Physical Therapy Evaluation Patient Details Name: Heather Singh MRN: [id number] DOB: Nov 28, 1945 Today's Date: 10/25/2021  History of Present Illness  Patient is a 76 year old female with medical history significant for dyslipidemia, fibrocystic breast disease, GERD, hypertension lipidemia, GERD and RLS, who presented to the emergency room with acute onset of right arm, hand, facial numbness. MRI of brain with no acute intracranial abnormality, moderately advanced chronic microvascular ischemic disease, and associated small remote lacunar infarct at the right thalamus.  Clinical Impression  The patient is agreeable to Heather Singh evaluation. She reports she is independent with mobility at baseline and lives at home with her spouse.  The patient is independent with all activity today. Challenged patient with high level dynamic balance activity while ambulating including head turns, directions changes, and sudden stops without loss of balance. No focal weakness is noted and sensation appears to be intact in all extremities. The patient is at her baseline level of functional mobility. Heather Singh will sign off as there are no Heather Singh needs identified at this time.      Recommendations for follow up therapy are one component of a multi-disciplinary discharge planning process, led by the attending physician.  Recommendations may be updated based on patient status, additional functional criteria and insurance authorization.  Follow Up Recommendations No Heather Singh follow up      Assistance Recommended at Discharge None  Patient can return home with the following       Equipment Recommendations None recommended by Heather Singh  Recommendations for Other Services       Functional Status Assessment Patient has not had a recent decline in their functional status     Precautions / Restrictions Precautions Precautions: None Restrictions Weight Bearing Restrictions: No      Mobility  Bed Mobility Overal bed mobility: Independent                   Transfers Overall transfer level: Independent                      Ambulation/Gait Ambulation/Gait assistance: Independent Gait Distance (Feet): 125 Feet Assistive device: None Gait Pattern/deviations: WFL(Within Functional Limits)       General Gait Details: no loss of balance with ambulation  Stairs            Wheelchair Mobility    Modified Rankin (Stroke Patients Only)       Balance Overall balance assessment: Independent                           High level balance activites: Direction changes, Turns, Sudden stops, Head turns High Level Balance Comments: no loss of balance with high level dynamic balance activity             Pertinent Vitals/Pain Pain Assessment Pain Assessment: No/denies pain    Home Living Family/patient expects to be discharged to:: Private residence Living Arrangements: Spouse/significant other Available Help at Discharge: Family Type of Home: House Home Access: Stairs to enter       Home Layout:  (split level, a few steps with rails inside the home) Home Equipment: None      Prior Function Prior Level of Function : Independent/Modified Independent;Driving                     Hand Dominance        Extremity/Trunk Assessment   Upper Extremity Assessment Upper Extremity Assessment: Overall WFL for tasks  assessed RUE Sensation: WNL RUE Coordination: WNL LUE Sensation: WNL LUE Coordination: WNL    Lower Extremity Assessment Lower Extremity Assessment: RLE deficits/detail;LLE deficits/detail RLE Deficits / Details: 5/5 dorsiflexion, plantarflexion, hip add/abd, hip flexion, knee extension RLE Sensation: WNL RLE Coordination: WNL LLE Deficits / Details: 5/5 dorsiflexion, plantarflexion, hip add/abd, hip flexion, knee extension LLE Sensation: WNL LLE Coordination: WNL       Communication   Communication: No difficulties  Cognition Arousal/Alertness:  Awake/alert Behavior During Therapy: WFL for tasks assessed/performed Overall Cognitive Status: Within Functional Limits for tasks assessed                                 General Comments: patient is able to follow all directions without difficulty        General Comments      Exercises     Assessment/Plan    Heather Singh Assessment Patient does not need any further Heather Singh services  Heather Singh Problem List         Heather Singh Treatment Interventions      Heather Singh Goals (Current goals can be found in the Care Plan section)  Acute Rehab Heather Singh Goals Patient Stated Goal: to go home Heather Singh Goal Formulation: All assessment and education complete, DC therapy Time For Goal Achievement: 10/25/21 Potential to Achieve Goals: Good    Frequency       Co-evaluation               AM-PAC Heather Singh "6 Clicks" Mobility  Outcome Measure Help needed turning from your back to your side while in a flat bed without using bedrails?: None Help needed moving from lying on your back to sitting on the side of a flat bed without using bedrails?: None Help needed moving to and from a bed to a chair (including a wheelchair)?: None Help needed standing up from a chair using your arms (e.g., wheelchair or bedside chair)?: None Help needed to walk in hospital room?: None Help needed climbing 3-5 steps with a railing? : None 6 Click Score: 24    End of Session   Activity Tolerance: Patient tolerated treatment well Patient left: in bed;with call bell/phone within reach Nurse Communication: Mobility status Heather Singh Visit Diagnosis: Other symptoms and signs involving the nervous system (F79.024)    Time: 0973-5329 Heather Singh Time Calculation (min) (ACUTE ONLY): 24 min   Charges:   Heather Singh Evaluation $Heather Singh Eval Low Complexity: 1 Low        Heather Singh, Heather Singh, Heather Singh   Percell Locus 10/25/2021, 12:20 PM

## 2021-10-26 NOTE — Chronic Care Management (AMB) (Signed)
Error Please Disregard

## 2021-10-27 ENCOUNTER — Encounter: Payer: Self-pay | Admitting: Family Medicine

## 2021-10-29 DIAGNOSIS — M5432 Sciatica, left side: Secondary | ICD-10-CM | POA: Diagnosis not present

## 2021-10-29 DIAGNOSIS — M5442 Lumbago with sciatica, left side: Secondary | ICD-10-CM | POA: Diagnosis not present

## 2021-10-29 DIAGNOSIS — G8929 Other chronic pain: Secondary | ICD-10-CM | POA: Diagnosis not present

## 2021-10-30 ENCOUNTER — Ambulatory Visit: Payer: Self-pay | Admitting: *Deleted

## 2021-10-30 NOTE — Telephone Encounter (Signed)
Message from [place] sent at 10/30/2021 10:31 AM EDT  Summary: medication reaction   Patient states she has hives on her buttocks and itching x3d   Patient thinks the hives are coming from atorvastatin (LIPITOR) 80 MG tablet   Please fu w/ patient           Call History   Type Contact Phone/Fax User  10/30/2021 10:31 AM EDT Phone (Incoming) Heather Singh, Heather Singh (Self) [phone] Jerilynn Mages) Leroy Kennedy R   Reason for Disposition  [1] MODERATE-SEVERE hives persist (i.e., hives interfere with normal activities or work) AND [2] taking antihistamine (e.g., Benadryl, Claritin) > 24 hours  Answer Assessment - Initial Assessment Questions 1. APPEARANCE: "What does the rash look like?"      Hives and itching on buttocks.    Whelps that itch. 2. LOCATION: "Where is the rash located?"      Buttock for 3 days.    I'm putting cream that the dermatologist gave me for something else. When in the hospital they changed my medication.   They put me on Plavix and increased my atorvastatin to 80 mg.   I'm wondering if the atorvastatin is causing it.   The Plavix is a new drug.   3. NUMBER: "How many hives are there?"      On both sides 4. SIZE: "How big are the hives?" (inches, cm, compare to coins) "Do they all look the same or is there lots of variation in shape and size?"      Whelps 5. ONSET: "When did the hives begin?" (Hours or days ago)      Tues.  I was itching some and then Thur. I realized it was hives. 6. ITCHING: "Does it itch?" If Yes, ask: "How bad is the itch?"    - MILD: doesn't interfere with normal activities   - MODERATE-SEVERE: interferes with work, school, sleep, or other activities      Moderate 7. RECURRENT PROBLEM: "Have you had hives before?" If Yes, ask: "When was the last time?" and "What happened that time?"      Yes I had this happen before 8. TRIGGERS: "Were you exposed to any new food, plant, cosmetic product or animal just before the hives began?"     Yes Plavix,  and atorvastatin increased to 80 mg.   I was in the hospital.   I got out of the hospital Sunday evening. 9. OTHER SYMPTOMS: "Do you have any other symptoms?" (e.g., fever, tongue swelling, difficulty breathing, abdomen pain)     No 10. PREGNANCY: "Is there any chance you are pregnant?" "When was your last menstrual period?"       N/A  Protocols used: Hives-A-AH

## 2021-10-30 NOTE — Telephone Encounter (Signed)
  Chief Complaint: Whelps/hives on her buttocks that are itching.   Also needs a hospital f/u appt. Symptoms: Both buttocks with hives that are itching.   She was in hospital started on Plavix and her atorvastatin was increased to 80 mg.  Thinks it's related to one of these.  Taking antihistamines, 2 kinds and on prednisone for something else right now. Frequency: For last 3 days  Pertinent Negatives: Patient denies shortness of breath, wheezing or chest tightness but has had reactions with hives to other medications. Disposition: '[]'$ ED /'[]'$ Urgent Care (no appt availability in office) / '[x]'$ Appointment(In office/virtual)/ '[]'$  [place] [facility]/ '[]'$ Home Care/ '[]'$ Refused Recommended Disposition /'[]'$ [facility]/ '[]'$  Follow-up with PCP Additional Notes: Called into the practice and spoke with Melissa.   She has a hospital f/u appt for Monday 11/02/2021 at 7:40.  Pt agreeable to this plan.   Instructions given for when to go to the ED.

## 2021-11-02 ENCOUNTER — Encounter: Payer: Self-pay | Admitting: Family Medicine

## 2021-11-02 ENCOUNTER — Ambulatory Visit (INDEPENDENT_AMBULATORY_CARE_PROVIDER_SITE_OTHER): Payer: Medicare Other | Admitting: Family Medicine

## 2021-11-02 VITALS — BP 120/66 | HR 76 | Resp 16 | Ht 60.0 in | Wt 104.0 lb

## 2021-11-02 DIAGNOSIS — Z09 Encounter for follow-up examination after completed treatment for conditions other than malignant neoplasm: Secondary | ICD-10-CM | POA: Diagnosis not present

## 2021-11-02 DIAGNOSIS — D649 Anemia, unspecified: Secondary | ICD-10-CM | POA: Diagnosis not present

## 2021-11-02 DIAGNOSIS — Z8673 Personal history of transient ischemic attack (TIA), and cerebral infarction without residual deficits: Secondary | ICD-10-CM | POA: Diagnosis not present

## 2021-11-02 DIAGNOSIS — K219 Gastro-esophageal reflux disease without esophagitis: Secondary | ICD-10-CM

## 2021-11-02 DIAGNOSIS — Z79899 Other long term (current) drug therapy: Secondary | ICD-10-CM | POA: Diagnosis not present

## 2021-11-02 DIAGNOSIS — E7800 Pure hypercholesterolemia, unspecified: Secondary | ICD-10-CM

## 2021-11-02 MED ORDER — PANTOPRAZOLE SODIUM 40 MG PO TBEC: 40.0000 mg | DELAYED_RELEASE_TABLET | Freq: Every day | ORAL | 0 refills | Status: DC

## 2021-11-02 NOTE — Progress Notes (Unsigned)
Name: Heather Singh   MRN: [id number]    DOB: 1945-12-21   Date:11/02/2021       Progress Note  Subjective  [facility] Follow Up  HPI  Admitted: 10/24/21 Discharged: 10/25/21  She went to [facility] on 10/24/2021, she developed numbness on right hand that progressed to right arm and right side of face, symptoms lasted a couple of hours, but present during arrival to [facility]. Symptoms completely resolved, studies done below   Reviewed LDL and it was 83, she is now taking higher dose of Atorvastatin - now on 80 mg dose   She is taking aspirin and PPI, stopped PPI due to plavix. She is now taking Tums. , we will switch to Pantoprazole   She has been worried about possible side effects of Atorvastatin, explained importance to decrease risk of stroke  MRI brain done on October 24, 2021  1. No acute intracranial abnormality. 2. Moderately advanced chronic microvascular ischemic disease. Associated small remote lacunar infarct at the right thalamus.  Echo 10/25/2021  Left ventricular ejection fraction, by estimation, is 60 to 65%. The left ventricle has normal function. The left ventricle has no regional wall motion abnormalities. Left ventricular diastolic parameters were normal. 1. 2. Right ventricular systolic function is normal. The right ventricular size is normal. 3. The mitral valve is normal in structure. Mild mitral valve regurgitation. 4. The aortic valve is normal in structure. Aortic valve regurgitation is not visualized. Agitated saline contrast bubble study was negative, with no evidence of any interatrial shunt. 5.  CT angio head and neck 08/12  1. Negative CTA for large vessel occlusion or other margin finding. 2. Mild atheromatous change about the carotid bifurcations and carotid siphons without hemodynamically significant stenosis. 3. Mild irregularity about the distal cervical ICAs bilaterally, favored to be related to chronic hypertension, although mild  changes of FMD could also be considered.    Patient Active Problem List   Diagnosis Date Noted   TIA (transient ischemic attack) 10/24/2021   Cerebral microvascular disease 04/13/2021   Hx of adenomatous colonic polyps 05/19/2018   Senile purpura ([place]) 04/28/2018   Closed compression fracture of L3 lumbar vertebra, sequela 08/23/2016   Atrophic vaginitis 06/05/2015   Acquired hypothyroidism 10/19/2014   Perennial allergic rhinitis 10/19/2014   B12 deficiency 10/19/2014   Fibrocystic breast disease 10/19/2014   GERD without esophagitis 10/19/2014   OP (osteoporosis) 10/19/2014   Ovarian failure 10/19/2014   Hiatal hernia 10/19/2014   Restless legs syndrome 10/19/2014   Dyslipidemia 08/23/2008   Hyperlipidemia 08/23/2008   Vitamin D deficiency 10/10/2007    Past Surgical History:  Procedure Laterality Date   BREAST CYST ASPIRATION Left    BREAST EXCISIONAL BIOPSY Left 1980's   neg   CATARACT EXTRACTION     EYE SURGERY     left   KYPHOPLASTY N/A 08/19/2016   Procedure: KYPHOPLASTY L3,L4;  Surgeon: Hessie Knows, MD;  Location: [facility] ORS;  Service: Orthopedics;  Laterality: N/A;   KYPHOPLASTY N/A 09/21/2016   Procedure: [id number];  Surgeon: Hessie Knows, MD;  Location: [facility] ORS;  Service: Orthopedics;  Laterality: N/A;   MOUTH SURGERY     POLYPECTOMY     SLING BLADE PROCEDURE     TOTAL ABDOMINAL HYSTERECTOMY      Family History  Problem Relation Age of Onset   Transient ischemic attack Mother    Atrial fibrillation Mother    Arthritis Mother        OA   Heart disease  Father        CHF   CAD Father    Hyperlipidemia Sister    Arthritis Sister        OA   Drug abuse Son    Hypertension Son    Stroke Sister        Lived in a nursing home and died from COVID-75   Arthritis Sister        OA   Heart failure Other    Coronary artery disease Other    Stroke Other    Hyperlipidemia Other    Hypertension Other    Thyroid disease Other    Breast cancer Maternal  Aunt    Breast cancer Paternal Aunt     Social History   Tobacco Use   Smoking status: Never   Smokeless tobacco: Never  Substance Use Topics   Alcohol use: No    Alcohol/week: 0.0 standard drinks of alcohol     Current Outpatient Medications:    aspirin 81 MG EC tablet, Take 1 tablet (81 mg total) by mouth daily. Swallow whole., Disp: 30 tablet, Rfl: 12   atorvastatin (LIPITOR) 80 MG tablet, Take 1 tablet (80 mg total) by mouth every evening., Disp: 30 tablet, Rfl: 2   calcium carbonate (TUMS EX) 750 MG chewable tablet, Chew 1 tablet by mouth daily., Disp: , Rfl:    Cholecalciferol (VITAMIN D) 2000 units CAPS, Take 1 capsule by mouth daily., Disp: , Rfl:    clopidogrel (PLAVIX) 75 MG tablet, Take 1 tablet (75 mg total) by mouth daily., Disp: 21 tablet, Rfl: 0   denosumab (PROLIA) 60 MG/ML SOSY injection, Inject 60 mg into the skin every 6 (six) months., Disp: , Rfl:    fexofenadine (ALLEGRA ALLERGY) 180 MG tablet, Take 1 tablet (180 mg total) by mouth daily., Disp: 90 tablet, Rfl: 0   levothyroxine (SYNTHROID) 25 MCG tablet, TAKE 1 TO 2 TABLETS BY MOUTH DAILY BEFORE BREAKFAST. ALTERNATE EVERY OTHER DAY, Disp: 135 tablet, Rfl: 1   Magnesium 400 MG TABS, Take 1 tablet by mouth daily., Disp: , Rfl:    omeprazole (PRILOSEC) 40 MG capsule, TAKE 1 CAPSULE(40 MG) BY MOUTH DAILY, Disp: 90 capsule, Rfl: 1   vitamin B-12 (CYANOCOBALAMIN) 1000 MCG tablet, Take 1,000 mcg by mouth 3 (three) times a week., Disp: , Rfl:   Allergies  Allergen Reactions   Alendronate     Dyspepsia, inflamed esophagus    Levofloxacin Hives   Nitrofurantoin Monohyd Macro Hives   Sulfamethoxazole-Trimethoprim Hives   Etodolac Hives   Penicillins Hives and Rash    Has patient had a PCN reaction causing immediate rash, facial/tongue/throat swelling, SOB or lightheadedness with hypotension: No Has patient had a PCN reaction causing severe rash involving mucus membranes or skin necrosis: No Has patient had a PCN  reaction that required hospitalization: No Has patient had a PCN reaction occurring within the last 10 years: No If all of the above answers are "NO", then may proceed with Cephalosporin use.     I personally reviewed active problem list, medication list, allergies, family history, social history, health maintenance with the patient/caregiver today.   ROS  Constitutional: Negative for fever or weight change.  Respiratory: Negative for cough and shortness of breath.   Cardiovascular: Negative for chest pain or palpitations.  Gastrointestinal: Negative for abdominal pain, no bowel changes.  Musculoskeletal: Negative for gait problem or joint swelling.  Skin: Negative for rash.  Neurological: Negative for dizziness or headache.  No other specific complaints  in a complete review of systems (except as listed in HPI above).   Objective  Vitals:   11/02/21 0740  BP: 120/66  Pulse: 76  Resp: 16  SpO2: 98%  Weight: 104 lb (47.2 kg)  Height: 5' (1.524 m)    Body mass index is 20.31 kg/m.  Physical Exam  Constitutional: Patient appears well-developed and well-nourished.  No distress.  HEENT: head atraumatic, normocephalic, pupils equal and reactive to light, neck supple Cardiovascular: Normal rate, regular rhythm and normal heart sounds.  No murmur heard. No BLE edema. Pulmonary/Chest: Effort normal and breath sounds normal. No respiratory distress. Abdominal: Soft.  There is no tenderness. Psychiatric: Patient has a normal mood and affect. behavior is normal. Judgment and thought content normal.    PHQ2/9:    11/02/2021    7:39 AM 06/15/2021   10:30 AM 05/19/2021   10:18 AM 05/11/2021    1:43 PM 04/13/2021    9:02 AM  Depression screen PHQ 2/9  Decreased Interest 0 0 0 0 0  Down, Depressed, Hopeless 0 0 0 0 0  PHQ - 2 Score 0 0 0 0 0  Altered sleeping 0 0 0 0 0  Tired, decreased energy 0 0 0 0 0  Change in appetite 0 0 0 0 0  Feeling bad or failure about yourself  0 0 0 0 0   Trouble concentrating 0 0 0 0 0  Moving slowly or fidgety/restless 0 0 0 0 0  Suicidal thoughts 0 0 0 0 0  PHQ-9 Score 0 0 0 0 0    phq 9 is negative   Fall Risk:    11/02/2021    7:39 AM 06/15/2021   10:29 AM 06/04/2021    3:32 PM 05/19/2021   10:20 AM 05/11/2021    1:43 PM  Fall Risk   Falls in the past year? 0 0 0 0 0  Number falls in past yr: 0 0 0 0 0  Injury with Fall? 0 0  0 0  Risk for fall due to : No Fall Risks No Fall Risks  No Fall Risks No Fall Risks  Follow up Falls prevention discussed Falls prevention discussed  Falls prevention discussed Falls prevention discussed      Functional Status Survey: Is the patient deaf or have difficulty hearing?: Yes Does the patient have difficulty seeing, even when wearing glasses/contacts?: No Does the patient have difficulty concentrating, remembering, or making decisions?: No Does the patient have difficulty walking or climbing stairs?: No Does the patient have difficulty dressing or bathing?: No Does the patient have difficulty doing errands alone such as visiting a doctor's office or shopping?: No    Assessment & Plan  1. Hospital discharge follow-up   2. History of TIA (transient ischemic attack)  - Lipid panel  3. Gastro-esophageal reflux disease without esophagitis  - pantoprazole (PROTONIX) 40 MG tablet; Take 1 tablet (40 mg total) by mouth daily. In place of omeprazole since she is taking plavix  Dispense: 90 tablet; Refill: 0  4. History of stroke   5. Long-term use of high-risk medication  - Comprehensive metabolic panel  6. Anemia, unspecified type  - CBC with Differential/Platelet - Iron, TIBC and Ferritin Panel  7. Pure hypercholesterolemia  - Lipid panel

## 2021-11-03 ENCOUNTER — Emergency Department: Payer: Medicare Other

## 2021-11-03 ENCOUNTER — Other Ambulatory Visit: Payer: Self-pay

## 2021-11-03 ENCOUNTER — Emergency Department
Admission: EM | Admit: 2021-11-03 | Discharge: 2021-11-04 | Disposition: A | Discharge status: Home / Self Care | Payer: Medicare Other | Attending: Emergency Medicine | Admitting: Emergency Medicine

## 2021-11-03 DIAGNOSIS — Z8616 Personal history of COVID-19: Secondary | ICD-10-CM | POA: Diagnosis not present

## 2021-11-03 DIAGNOSIS — I6782 Cerebral ischemia: Secondary | ICD-10-CM | POA: Diagnosis not present

## 2021-11-03 DIAGNOSIS — R200 Anesthesia of skin: Secondary | ICD-10-CM | POA: Diagnosis not present

## 2021-11-03 DIAGNOSIS — E785 Hyperlipidemia, unspecified: Secondary | ICD-10-CM | POA: Diagnosis not present

## 2021-11-03 DIAGNOSIS — Z79899 Other long term (current) drug therapy: Secondary | ICD-10-CM | POA: Diagnosis not present

## 2021-11-03 DIAGNOSIS — E039 Hypothyroidism, unspecified: Secondary | ICD-10-CM | POA: Diagnosis not present

## 2021-11-03 DIAGNOSIS — Z7982 Long term (current) use of aspirin: Secondary | ICD-10-CM | POA: Diagnosis not present

## 2021-11-03 DIAGNOSIS — E8809 Other disorders of plasma-protein metabolism, not elsewhere classified: Secondary | ICD-10-CM | POA: Diagnosis not present

## 2021-11-03 DIAGNOSIS — Z8673 Personal history of transient ischemic attack (TIA), and cerebral infarction without residual deficits: Secondary | ICD-10-CM | POA: Insufficient documentation

## 2021-11-03 DIAGNOSIS — R2990 Unspecified symptoms and signs involving the nervous system: Secondary | ICD-10-CM

## 2021-11-03 DIAGNOSIS — Z7902 Long term (current) use of antithrombotics/antiplatelets: Secondary | ICD-10-CM | POA: Insufficient documentation

## 2021-11-03 DIAGNOSIS — G459 Transient cerebral ischemic attack, unspecified: Secondary | ICD-10-CM | POA: Diagnosis not present

## 2021-11-03 DIAGNOSIS — R9431 Abnormal electrocardiogram [ECG] [EKG]: Secondary | ICD-10-CM | POA: Diagnosis not present

## 2021-11-03 DIAGNOSIS — I6381 Other cerebral infarction due to occlusion or stenosis of small artery: Secondary | ICD-10-CM | POA: Diagnosis not present

## 2021-11-03 LAB — URINE DRUG SCREEN, QUALITATIVE (ARMC ONLY)
Amphetamines, Ur Screen: NOT DETECTED
Barbiturates, Ur Screen: NOT DETECTED
Benzodiazepine, Ur Scrn: NOT DETECTED
Cannabinoid 50 Ng, Ur ~~LOC~~: NOT DETECTED
Cocaine Metabolite,Ur ~~LOC~~: NOT DETECTED
MDMA (Ecstasy)Ur Screen: NOT DETECTED
Methadone Scn, Ur: NOT DETECTED
Opiate, Ur Screen: NOT DETECTED
Phencyclidine (PCP) Ur S: NOT DETECTED
Tricyclic, Ur Screen: NOT DETECTED

## 2021-11-03 LAB — COMPREHENSIVE METABOLIC PANEL
ALT: 20 U/L (ref 0–44)
AST: 27 U/L (ref 15–41)
Albumin: 5.2 g/dL — ABNORMAL HIGH (ref 3.5–5.0)
Alkaline Phosphatase: 48 U/L (ref 38–126)
Anion gap: 11 (ref 5–15)
BUN: 19 mg/dL (ref 8–23)
CO2: 25 mmol/L (ref 22–32)
Calcium: 10.4 mg/dL — ABNORMAL HIGH (ref 8.9–10.3)
Chloride: 101 mmol/L (ref 98–111)
Creatinine, Ser: 0.85 mg/dL (ref 0.44–1.00)
GFR, Estimated: >60 mL/min (ref >60)
Glucose, Bld: 118 mg/dL — ABNORMAL HIGH (ref 70–99)
Potassium: 4.1 mmol/L (ref 3.5–5.1)
Sodium: 137 mmol/L (ref 135–145)
Total Bilirubin: 0.6 mg/dL (ref 0.3–1.2)
Total Protein: 8.2 g/dL — ABNORMAL HIGH (ref 6.5–8.1)

## 2021-11-03 LAB — URINALYSIS, ROUTINE W REFLEX MICROSCOPIC
Bacteria, UA: NONE SEEN
Bilirubin Urine: NEGATIVE
Glucose, UA: NEGATIVE mg/dL
Ketones, ur: NEGATIVE mg/dL
Leukocytes,Ua: NEGATIVE
Nitrite: NEGATIVE
Protein, ur: NEGATIVE mg/dL
Specific Gravity, Urine: 1.003 — ABNORMAL LOW (ref 1.005–1.030)
Squamous Epithelial / HPF: NONE SEEN (ref 0–5)
pH: 7 (ref 5.0–8.0)

## 2021-11-03 LAB — CBC
HCT: 38.3 % (ref 36.0–46.0)
Hemoglobin: 13 g/dL (ref 12.0–15.0)
MCH: 32.6 pg (ref 26.0–34.0)
MCHC: 33.9 g/dL (ref 30.0–36.0)
MCV: 96 fL (ref 80.0–100.0)
Platelets: 304 10*3/uL (ref 150–400)
RBC: 3.99 MIL/uL (ref 3.87–5.11)
RDW: 12.6 % (ref 11.5–15.5)
WBC: 9.1 10*3/uL (ref 4.0–10.5)
nRBC: 0 % (ref 0.0–0.2)

## 2021-11-03 LAB — DIFFERENTIAL
Abs Immature Granulocytes: 0.05 10*3/uL (ref 0.00–0.07)
Basophils Absolute: 0 10*3/uL (ref 0.0–0.1)
Basophils Relative: 0 %
Eosinophils Absolute: 0 10*3/uL (ref 0.0–0.5)
Eosinophils Relative: 0 %
Immature Granulocytes: 1 %
Lymphocytes Relative: 11 %
Lymphs Abs: 1 10*3/uL (ref 0.7–4.0)
Monocytes Absolute: 0.5 10*3/uL (ref 0.1–1.0)
Monocytes Relative: 6 %
Neutro Abs: 7.5 10*3/uL (ref 1.7–7.7)
Neutrophils Relative %: 82 %

## 2021-11-03 LAB — PROTIME-INR
INR: 0.9 (ref 0.8–1.2)
Prothrombin Time: 11.6 seconds (ref 11.4–15.2)

## 2021-11-03 LAB — TROPONIN I (HIGH SENSITIVITY): Troponin I (High Sensitivity): 4 ng/L (ref <18)

## 2021-11-03 LAB — CBG MONITORING, ED: Glucose-Capillary: 123 mg/dL — ABNORMAL HIGH (ref 70–99)

## 2021-11-03 LAB — APTT: aPTT: 24 seconds (ref 24–36)

## 2021-11-03 NOTE — ED Notes (Signed)
CODE STROKE called per Ellender Hose, MD to Karen Chafe Maudie Mercury)

## 2021-11-03 NOTE — ED Triage Notes (Signed)
Ambulatory to triage with c/o numbness to right side of face, eye, and neck. Sx onset 6pm tonight Pt seen here 10 days ago for same c/o, dx with TIA per pt.  Denies other neuro deficits. Speaking in clear and complete sentences. NIH 0. NAD noted.

## 2021-11-03 NOTE — Progress Notes (Signed)
2146 Code Stroke Cart Activated 2203 Neuro TSMD paged 2207 Neuro [facility] on camera MRS 0 LKW 1800  -[place] Telestroke RN

## 2021-11-03 NOTE — Consult Note (Signed)
[facility] Tele[facility]urology Consult Services   Patient Name:   Heather Singh, Heather Singh Date of Birth:   Aug 05, 1945 Identification Number:   MRN - [id number] Date of Service:   11/03/2021 22:03:42  Diagnosis:       G45.9 - Transient cerebral ischemic attack, unspecified  Impression:      76 year old female with history of recent TIA with transient right-sided facial and arm numbness earlier this month as well as a small old stroke discovered on imaging (new since January 2023), history of hyperlipidemia, hypothyroidism who felt somewhat tired since around noon time today, however around 6 PM, noted acute right facial numbness. Since then symptoms are much improved but still with some very spotty sensory complaints in the upper right face. Denies other neurologic symptoms.  Currently on examination, she has some very minor altered sensation of the right upper face only but much improved from earlier. No other significant deficits. NIHSS = 1. CT head revealed no acute findings.  Suspect TIA versus stroke with resolving deficit. Admit for cerebrovascular work-up and management.  Our recommendations are outlined below.  Recommendations:        Stroke/Telemetry Floor       Neuro Checks       Bedside Swallow Eval       DVT Prophylaxis       IV Fluids, Normal Saline       Head of Bed 30 Degrees       Euglycemia and Avoid Hyperthermia (PRN Acetaminophen)       Antihypertensives PRN if Blood pressure is greater than 220/120 or there is a concern for End organ damage/contraindications for permissive HTN. If blood pressure is greater than 220/120 give labetalol PO or IV or Vasotec IV with a goal of 15% reduction in BP during the first 24 hours.       Continue aspirin and Plavix  Per facility request will defer further work up, management, and referrals to inpatient service, inclusive of inpatient neurology consult.  Sign Out:       Discussed with Emergency Department  Provider    ------------------------------------------------------------------------------  Metrics: Last Known Well: 11/03/2021 18:00:00 [facility] Notification Time: 11/03/2021 22:03:42 Arrival Time: 11/03/2021 21:07:00 Stamp Time: 11/03/2021 22:03:42 Initial Response Time: 11/03/2021 22:06:58 Symptoms: Right facial numbness . Initial patient interaction: 11/03/2021 22:09:41 NIHSS Assessment Completed: 11/03/2021 22:13:39 Patient is not a candidate for Thrombolytic. Thrombolytic Medical Decision: 11/03/2021 22:13:48 Patient was not deemed candidate for Thrombolytic because of following reasons: No disabling symptoms.  CT head showed no acute hemorrhage or acute core infarct. I personally Reviewed the CT Head and it Showed No bleed or acute infarct.On right chronic ischemic change. Aspects score = 10.  Primary Provider Notified of Diagnostic Impression and Management Plan on: 11/03/2021 22:29:07    ------------------------------------------------------------------------------  History of Present Illness: Patient is a 76 year old Female.  Patient was brought by private transportation with symptoms of Right facial numbness . 76 year old female with history of recent TIA with transient right-sided facial and arm numbness earlier this month as well as a small old stroke discovered on imaging (new since January 2023), history of hyperlipidemia, hypothyroidism who felt somewhat tired since around noon time today, however around 6 PM, noted acute right facial numbness. Since then symptoms are much improved but still with some very spotty sensory complaints in the upper right face. Denies other neurologic symptoms.    Past Medical History:      Hyperlipidemia      Stroke  Covid-19      Migraine Headaches      There is no history of Hypertension      There is no history of Diabetes Mellitus      There is no history of Atrial Fibrillation      There is no history of  Coronary Artery Disease      There is no history of Seizures [place] PMH:  TIA, Hyperlipidemia, B12 deficiency , ocular migraine , Hypothyroid  Medications:  No Anticoagulant use  Antiplatelet use: Yes Plavix 75 mg daily, ASA 81 mg Reviewed EMR for current medications Other Medications Pertinent To Assessment Include: Atorvastatin, calcium carbonate, Prolia, Allegra, levothyroxine, magnesium, pantoprazole, vitamin B12  Allergies:  Reviewed Description: Alendronate, levofloxacin, nitrofurantoin, sulfamethoxazole trimethoprim, etodolac, penicillins  Social History: Patient Is: Married Smoking: No Alcohol Use: No Drug Use: No  Family History:  There Is Family History VO:JJKK, CAD , CVA There is no family history of premature cerebrovascular disease pertinent to this consultation  ROS : 14 Points Review of Systems was performed and was negative except mentioned in HPI.  Past Surgical History: There Is No Surgical History Contributory To Today's Visit There Is Surgical History of:  Back surgeries , Bladder surgery, Cataracts bilateral     Examination: BP(162/95), Pulse(80), Blood Glucose(123) 1A: Level of Consciousness - Alert; keenly responsive + 0 1B: Ask Month and Age - Both Questions Right + 0 1C: Blink Eyes & Squeeze Hands - Performs Both Tasks + 0 2: Test Horizontal Extraocular Movements - Normal + 0 3: Test Visual Fields - No Visual Loss + 0 4: Test Facial Palsy (Use Grimace if Obtunded) - Normal symmetry + 0 5A: Test Left Arm Motor Drift - No Drift for 10 Seconds + 0 5B: Test Right Arm Motor Drift - No Drift for 10 Seconds + 0 6A: Test Left Leg Motor Drift - No Drift for 5 Seconds + 0 6B: Test Right Leg Motor Drift - No Drift for 5 Seconds + 0 7: Test Limb Ataxia (FNF/Heel-Heather Singh) - No Ataxia + 0 8: Test Sensation - Mild-Moderate Loss: Less Sharp/More Dull + 1 9: Test Language/Aphasia - Normal; No aphasia + 0 10: Test Dysarthria - Normal + 0 11: Test  Extinction/Inattention - No abnormality + 0  NIHSS Score: 1  NIHSS Free Text : Very slight diminished sensation of right upper face in the frontal region but mid and lower face symmetric now. She tells me is much improved but spotty.  She does have some chronic spotty vision in her left eye temporal field from her prior eye injury but grossly visual fields are okay.  Pre-Morbid Modified Rankin Scale: 0 Points = No symptoms at all  Spoke with : Dr Ellender Hose  Patient/Family was informed the Neurology Consult would occur via TeleHealth consult by way of interactive audio and video telecommunications and consented to receiving care in this manner.   Patient is being evaluated for possible acute neurologic impairment and high probability of imminent or life-threatening deterioration. I spent total of 39 minutes providing care to this patient, including time for face to face visit via telemedicine, review of medical records, imaging studies and discussion of findings with providers, the patient and/or family.   Dr Daria Pastures   [facility] For Inpatient follow-up with [facility] physician please call [facility] [phone]. This is not an outpatient service. Post hospital discharge, please contact hospital directly.

## 2021-11-03 NOTE — ED Provider Notes (Signed)
[facility] Provider Note    Event Date/Time   First MD Initiated Contact with Patient 11/03/21 2140     (approximate)   History   Numbness   HPI  Heather Singh is a 76 y.o. female here with transient right facial numbness and neck numbness.  The patient was in her usual state of health until around 6 PM tonight.  The patient states that she had gone to tai chi and was doing overall well.  When she returned home, she began to notice altered sensation on the right face as well as right neck.  This persisted for some time.  She states she had potentially felt this off and on throughout the day but it began more acutely at around 6 PM.  She denies any vision changes.  No focal numbness or weakness.  She had somewhat similar symptoms recently and was admitted for TIA.  She has been started on aspirin and Plavix and has been taking this.  Denies any difficulty speaking or swallowing.     Physical Exam   Triage Vital Signs: ED Triage Vitals [11/03/21 2114]  Enc Vitals Group     BP (!) 157/87     Pulse Rate 76     Resp 16     Temp 98.1 F (36.7 C)     Temp Source Oral     SpO2 99 %     Weight 105 lb (47.6 kg)     Height 5' (1.524 m)     Head Circumference      Peak Flow      Pain Score 0     Pain Loc      Pain Edu?      Excl. in GC?     Most recent vital signs: Vitals:   11/03/21 2200 11/03/21 2300  BP: (!) 144/76 (!) 150/80  Pulse: 71 79  Resp: 15   Temp:    SpO2: 97% 97%     General: Awake, no distress.  CV:  Good peripheral perfusion.  Resp:  Normal effort.  Abd:  No distention.  Other:  Neurological Exam:  Mental Status: Alert and oriented to person, place, and time. Attention and concentration normal. Speech clear. Recent memory is intact. Cranial Nerves: Visual fields grossly intact. EOMI and PERRLA. No nystagmus noted. Facial sensation intact at forehead, maxillary cheek, and chin/mandible bilaterally. No facial asymmetry or  weakness. Hearing grossly normal. Uvula is midline, and palate elevates symmetrically. Normal SCM and trapezius strength. Tongue midline without fasciculations. Motor: Muscle strength 5/5 in proximal and distal UE and LE bilaterally. No pronator drift. Muscle tone normal. Sensation: Intact to light touch in upper and lower extremities distally bilaterally.  Coordination: Normal FTN bilaterally.     ED Results / Procedures / Treatments   Labs (all labs ordered are listed, but only abnormal results are displayed) Labs Reviewed  COMPREHENSIVE METABOLIC PANEL - Abnormal; Notable for the following components:      Result Value   Glucose, Bld 118 (*)    Calcium 10.4 (*)    Total Protein 8.2 (*)    Albumin 5.2 (*)    All other components within normal limits  URINALYSIS, ROUTINE W REFLEX MICROSCOPIC - Abnormal; Notable for the following components:   Color, Urine COLORLESS (*)    APPearance CLEAR (*)    Specific Gravity, Urine 1.003 (*)    Hgb urine dipstick SMALL (*)    All other components within normal limits  CBG  MONITORING, ED - Abnormal; Notable for the following components:   Glucose-Capillary 123 (*)    All other components within normal limits  PROTIME-INR  APTT  CBC  DIFFERENTIAL  URINE DRUG SCREEN, QUALITATIVE ([facility] ONLY)  TROPONIN I (HIGH SENSITIVITY)  TROPONIN I (HIGH SENSITIVITY)     EKG Normal sinus rhythm, ventricular rate 69.  PR 141, QRS 73, QTc 418.  No acute ST elevations or depressions.   RADIOLOGY CT head: No acute abnormality   I also independently reviewed and agree with radiologist interpretations.   PROCEDURES:  Critical Care performed: No   MEDICATIONS ORDERED IN ED: Medications - No data to display   IMPRESSION / MDM / [place] / ED COURSE  I reviewed the triage vital signs and the nursing notes.                               The patient is on the cardiac monitor to evaluate for evidence of arrhythmia and/or significant  heart rate changes.   Ddx:  Differential includes the following, with pertinent life- or limb-threatening emergencies considered:  TIA, CVA, trigeminal neuralgia, upper cervical radiculopathy, anxiety, electrolyte abnormality such as hypokalemia or hypocalcemia  Patient's presentation is most consistent with acute presentation with potential threat to life or bodily function.  MDM:  76 year old female here with transient right facial and neck numbness, now largely resolved.  The patient just had a somewhat similar presentation on 8/12 and underwent TIA evaluation including imaging of her carotids and echocardiogram.  She had no major findings at that time and was placed on aspirin and Plavix.  She did have an old stroke noted on MRI.  Patient was activated as a code stroke here, and I discussed the case with the teleneurologist.  He recommends MRI for evaluation of possible new stroke, and observation for possible TIA.  Patient feels largely back to her baseline now.  Her neurological exam is unremarkable.  Discussed that if MRI is negative, given that the patient has already been medically optimized, would likely be reasonable to discharge with outpatient follow-up.  Labs otherwise reassuring.  No leukocytosis or anemia.  CMP shows mild hypercalcemia, but slightly elevated albumin which likely accounts for some of this.  Urinalysis negative.  UDS negative.  No other apparent acute medical condition.   MEDICATIONS GIVEN IN ED: Medications - No data to display   Consults:  Teleneurology   EMR reviewed  Reviewed recent admission, CTA, MRI, TIA evaluation     FINAL CLINICAL IMPRESSION(S) / ED DIAGNOSES   Final diagnoses:  Stroke-like symptoms     Rx / DC Orders   ED Discharge Orders     None        Note:  This document was prepared using Dragon voice recognition software and may include unintentional dictation errors.   Duffy Bruce, MD 11/03/21 2329

## 2021-11-04 DIAGNOSIS — R200 Anesthesia of skin: Secondary | ICD-10-CM | POA: Diagnosis not present

## 2021-11-04 NOTE — Discharge Instructions (Addendum)
Your MRI did not show that you had a stroke.  Possible that you had a transient ischemic attack.  Please continue to take your medications as prescribed and follow-up with neurology.

## 2021-11-04 NOTE — ED Provider Notes (Signed)
Patient signed out pending MRI.  76 year old female who presents with right-sided numbness.  Has history of TIAs has seen neurology and is medically optimized.  Pending MRI to rule out CVA.  Patient prefers not to be admitted for further TIA work-up we agree this is reasonable given she has had this work-up already recently.  MRI is negative for acute pathology.  Will discharge.   Rada Hay, MD 11/04/21 [phone]

## 2021-11-12 DIAGNOSIS — R410 Disorientation, unspecified: Secondary | ICD-10-CM | POA: Diagnosis not present

## 2021-11-12 DIAGNOSIS — Z7689 Persons encountering health services in other specified circumstances: Secondary | ICD-10-CM | POA: Diagnosis not present

## 2021-11-12 DIAGNOSIS — R200 Anesthesia of skin: Secondary | ICD-10-CM | POA: Diagnosis not present

## 2021-11-12 DIAGNOSIS — Z8673 Personal history of transient ischemic attack (TIA), and cerebral infarction without residual deficits: Secondary | ICD-10-CM | POA: Diagnosis not present

## 2021-12-04 DIAGNOSIS — M810 Age-related osteoporosis without current pathological fracture: Secondary | ICD-10-CM | POA: Diagnosis not present

## 2021-12-09 ENCOUNTER — Other Ambulatory Visit: Payer: Self-pay

## 2021-12-09 DIAGNOSIS — D708 Other neutropenia: Secondary | ICD-10-CM | POA: Diagnosis not present

## 2021-12-09 DIAGNOSIS — I700 Atherosclerosis of aorta: Secondary | ICD-10-CM

## 2021-12-09 DIAGNOSIS — K219 Gastro-esophageal reflux disease without esophagitis: Secondary | ICD-10-CM | POA: Diagnosis not present

## 2021-12-09 DIAGNOSIS — D649 Anemia, unspecified: Secondary | ICD-10-CM | POA: Diagnosis not present

## 2021-12-09 DIAGNOSIS — Z8673 Personal history of transient ischemic attack (TIA), and cerebral infarction without residual deficits: Secondary | ICD-10-CM | POA: Diagnosis not present

## 2021-12-09 DIAGNOSIS — E7800 Pure hypercholesterolemia, unspecified: Secondary | ICD-10-CM | POA: Diagnosis not present

## 2021-12-10 LAB — COMPLETE METABOLIC PANEL WITH GFR
AG Ratio: 2.3 (calc) (ref 1.0–2.5)
ALT: 15 U/L (ref 6–29)
AST: 23 U/L (ref 10–35)
Albumin: 4.3 g/dL (ref 3.6–5.1)
Alkaline phosphatase (APISO): 41 U/L (ref 37–153)
BUN: 12 mg/dL (ref 7–25)
CO2: 30 mmol/L (ref 20–32)
Calcium: 9.5 mg/dL (ref 8.6–10.4)
Chloride: 100 mmol/L (ref 98–110)
Creat: 0.85 mg/dL (ref 0.60–1.00)
Globulin: 1.9 g/dL (calc) (ref 1.9–3.7)
Glucose, Bld: 81 mg/dL (ref 65–99)
Potassium: 4.3 mmol/L (ref 3.5–5.3)
Sodium: 137 mmol/L (ref 135–146)
Total Bilirubin: 0.5 mg/dL (ref 0.2–1.2)
Total Protein: 6.2 g/dL (ref 6.1–8.1)
eGFR: 70 mL/min/{1.73_m2} (ref >=60)

## 2021-12-10 LAB — CBC WITH DIFFERENTIAL/PLATELET
Absolute Monocytes: 346 cells/uL (ref 200–950)
Basophils Absolute: 11 cells/uL (ref 0–200)
Basophils Relative: 0.3 %
Eosinophils Absolute: 68 cells/uL (ref 15–500)
Eosinophils Relative: 1.8 %
HCT: 34.1 % — ABNORMAL LOW (ref 35.0–45.0)
Hemoglobin: 11.4 g/dL — ABNORMAL LOW (ref 11.7–15.5)
Lymphs Abs: 980 cells/uL (ref 850–3900)
MCH: 32.8 pg (ref 27.0–33.0)
MCHC: 33.4 g/dL (ref 32.0–36.0)
MCV: 98 fL (ref 80.0–100.0)
MPV: 10.9 fL (ref 7.5–12.5)
Monocytes Relative: 9.1 %
Neutro Abs: 2394 cells/uL (ref 1500–7800)
Neutrophils Relative %: 63 %
Platelets: 209 10*3/uL (ref 140–400)
RBC: 3.48 10*6/uL — ABNORMAL LOW (ref 3.80–5.10)
RDW: 12.2 % (ref 11.0–15.0)
Total Lymphocyte: 25.8 %
WBC: 3.8 10*3/uL (ref 3.8–10.8)

## 2021-12-10 LAB — IRON,TIBC AND FERRITIN PANEL
%SAT: 35 % (calc) (ref 16–45)
Ferritin: 32 ng/mL (ref 16–288)
Iron: 118 ug/dL (ref 45–160)
TIBC: 337 mcg/dL (calc) (ref 250–450)

## 2021-12-10 LAB — LIPID PANEL
Cholesterol: 158 mg/dL (ref <200)
HDL: 74 mg/dL (ref >=50)
LDL Cholesterol (Calc): 70 mg/dL (calc)
Non-HDL Cholesterol (Calc): 84 mg/dL (calc) (ref <130)
Total CHOL/HDL Ratio: 2.1 (calc) (ref <5.0)
Triglycerides: 65 mg/dL (ref <150)

## 2021-12-14 ENCOUNTER — Other Ambulatory Visit: Payer: Self-pay | Admitting: Family Medicine

## 2021-12-14 DIAGNOSIS — M810 Age-related osteoporosis without current pathological fracture: Secondary | ICD-10-CM

## 2021-12-14 DIAGNOSIS — Z1231 Encounter for screening mammogram for malignant neoplasm of breast: Secondary | ICD-10-CM

## 2021-12-16 NOTE — Progress Notes (Unsigned)
Name: Heather Singh   MRN: [id number]    DOB: 1945/08/04   Date:12/17/2021       Progress Note  Subjective  Chief Complaint  Follow Up  HPI  History of TIA/microvascular disease: had an episode of confusion in January 2023 during COVID-19 and two episodes in August, currently on aspirin , plavix and Atorvastatin 80 mg, tolerating medication well, she is going back to Dr. Melrose Nakayama at the end of this month , she asymptomatic at this. Numbness on right side of face has finally resolved. LDL at goal at 70   Osteoporosis: she has been seeing Dr. Gabriel Carina, [place] started 10/2016. She is tolerating medication well, she had a bone density done by Dr. Gabriel Carina had her first Prolia Fall 2020, due for repeat in Nov    GERD and hiatal hernia : Has been seeing Dr. Tami Ribas and he advised  Omeprazole '40mg'$ . Says symptoms have been well controlled without heartburn  or indigestion.  Still has a dry cough that is stable and chronic .  She saw pulmonologist , Dr. Raul Del Fall 2021 symptoms are stable.    Hypothyroidism: she is taking levothyroxine as prescribed, taking one tablet one day and two every other day, TSH has been stable for a while. No change in bowel movements , dry skin skin is stable. Continue currently regiment    Hyperlipidemia/Atherosclerosis of aorta : She is on Atorvastatin and last LDL was at goal at 70    Senile purpura: stable, usually on arms , reassurance given . Unchanged    Chronic constipation: doing well on Miralax , se is now taking it a few times a week otherwise gives her diarrhea . Stable    Vitamin B12 deficiency and vitamin D deficiency continue supplements   Mild anemia: colonoscopy is due in 2025, ferritin is dropping , advised iron supplementation and recheck with next labs , check hemoccult stools once she stops plavix   Patient Active Problem List   Diagnosis Date Noted   TIA (transient ischemic attack) 10/24/2021   Cerebral microvascular disease 04/13/2021   Hx of  adenomatous colonic polyps 05/19/2018   Senile purpura ([place]) 04/28/2018   Closed compression fracture of L3 lumbar vertebra, sequela 08/23/2016   Atrophic vaginitis 06/05/2015   Acquired hypothyroidism 10/19/2014   Perennial allergic rhinitis 10/19/2014   B12 deficiency 10/19/2014   Fibrocystic breast disease 10/19/2014   GERD without esophagitis 10/19/2014   OP (osteoporosis) 10/19/2014   Ovarian failure 10/19/2014   Hiatal hernia 10/19/2014   Restless legs syndrome 10/19/2014   Dyslipidemia 08/23/2008   Hyperlipidemia 08/23/2008   Vitamin D deficiency 10/10/2007    Past Surgical History:  Procedure Laterality Date   BREAST CYST ASPIRATION Left    BREAST EXCISIONAL BIOPSY Left 1980's   neg   CATARACT EXTRACTION     EYE SURGERY     left   KYPHOPLASTY N/A 08/19/2016   Procedure: KYPHOPLASTY L3,L4;  Surgeon: Hessie Knows, MD;  Location: [facility] ORS;  Service: Orthopedics;  Laterality: N/A;   KYPHOPLASTY N/A 09/21/2016   Procedure: [id number];  Surgeon: Hessie Knows, MD;  Location: [facility] ORS;  Service: Orthopedics;  Laterality: N/A;   MOUTH SURGERY     POLYPECTOMY     SLING BLADE PROCEDURE     TOTAL ABDOMINAL HYSTERECTOMY      Family History  Problem Relation Age of Onset   Transient ischemic attack Mother    Atrial fibrillation Mother    Arthritis Mother        OA  Heart disease Father        CHF   CAD Father    Hyperlipidemia Sister    Arthritis Sister        OA   Drug abuse Son    Hypertension Son    Stroke Sister        Lived in a nursing home and died from COVID-17   Arthritis Sister        OA   Heart failure Other    Coronary artery disease Other    Stroke Other    Hyperlipidemia Other    Hypertension Other    Thyroid disease Other    Breast cancer Maternal Aunt    Breast cancer Paternal Aunt     Social History   Tobacco Use   Smoking status: Never   Smokeless tobacco: Never  Substance Use Topics   Alcohol use: No    Alcohol/week: 0.0 standard  drinks of alcohol     Current Outpatient Medications:    aspirin 81 MG EC tablet, Take 1 tablet (81 mg total) by mouth daily. Swallow whole., Disp: 30 tablet, Rfl: 12   atorvastatin (LIPITOR) 80 MG tablet, Take 1 tablet (80 mg total) by mouth every evening., Disp: 30 tablet, Rfl: 2   calcium carbonate (TUMS EX) 750 MG chewable tablet, Chew 1 tablet by mouth daily., Disp: , Rfl:    Cholecalciferol (VITAMIN D) 2000 units CAPS, Take 1 capsule by mouth daily., Disp: , Rfl:    clopidogrel (PLAVIX) 75 MG tablet, Take 1 tablet (75 mg total) by mouth daily., Disp: 21 tablet, Rfl: 0   denosumab (PROLIA) 60 MG/ML SOSY injection, Inject 60 mg into the skin every 6 (six) months., Disp: , Rfl:    fexofenadine (ALLEGRA ALLERGY) 180 MG tablet, Take 1 tablet (180 mg total) by mouth daily., Disp: 90 tablet, Rfl: 0   levothyroxine (SYNTHROID) 25 MCG tablet, TAKE 1 TO 2 TABLETS BY MOUTH DAILY BEFORE BREAKFAST. ALTERNATE EVERY OTHER DAY, Disp: 135 tablet, Rfl: 1   Magnesium 400 MG TABS, Take 1 tablet by mouth daily., Disp: , Rfl:    pantoprazole (PROTONIX) 40 MG tablet, Take 1 tablet (40 mg total) by mouth daily. In place of omeprazole since she is taking plavix, Disp: 90 tablet, Rfl: 0   vitamin B-12 (CYANOCOBALAMIN) 1000 MCG tablet, Take 1,000 mcg by mouth 3 (three) times a week., Disp: , Rfl:   Allergies  Allergen Reactions   Alendronate     Dyspepsia, inflamed esophagus    Levofloxacin Hives   Nitrofurantoin Monohyd Macro Hives   Sulfamethoxazole-Trimethoprim Hives   Etodolac Hives   Penicillins Hives and Rash    Has patient had a PCN reaction causing immediate rash, facial/tongue/throat swelling, SOB or lightheadedness with hypotension: No Has patient had a PCN reaction causing severe rash involving mucus membranes or skin necrosis: No Has patient had a PCN reaction that required hospitalization: No Has patient had a PCN reaction occurring within the last 10 years: No If all of the above answers are  "NO", then may proceed with Cephalosporin use.     I personally reviewed active problem list, medication list, allergies, family history, social history, health maintenance with the patient/caregiver today.   ROS  Constitutional: Negative for fever or weight change.  Respiratory: positive  for cough but no  shortness of breath.   Cardiovascular: Negative for chest pain or palpitations.  Gastrointestinal: Negative for abdominal pain, no bowel changes.  Musculoskeletal: Negative for gait problem or joint swelling.  Skin: Negative for rash.  Neurological: Negative for dizziness or headache.  No other specific complaints in a complete review of systems (except as listed in HPI above).   Objective  Vitals:   12/17/21 0848  BP: 110/64  Pulse: 79  Resp: 16  SpO2: 98%  Weight: 102 lb (46.3 kg)  Height: 5' (1.524 m)    Body mass index is 19.92 kg/m.  Physical Exam  Constitutional: Patient appears well-developed and well-nourished.  No distress.  HEENT: head atraumatic, normocephalic, pupils equal and reactive to light, neck supple Cardiovascular: Normal rate, regular rhythm and normal heart sounds.  No murmur heard. No BLE edema. Pulmonary/Chest: Effort normal and breath sounds normal. No respiratory distress. Abdominal: Soft.  There is no tenderness. Skin: senile purpura  Psychiatric: Patient has a normal mood and affect. behavior is normal. Judgment and thought content normal.    PHQ2/9:    12/17/2021    8:45 AM 11/02/2021    7:39 AM 06/15/2021   10:30 AM 05/19/2021   10:18 AM 05/11/2021    1:43 PM  Depression screen PHQ 2/9  Decreased Interest 0 0 0 0 0  Down, Depressed, Hopeless 0 0 0 0 0  PHQ - 2 Score 0 0 0 0 0  Altered sleeping 0 0 0 0 0  Tired, decreased energy 0 0 0 0 0  Change in appetite 0 0 0 0 0  Feeling bad or failure about yourself  0 0 0 0 0  Trouble concentrating 0 0 0 0 0  Moving slowly or fidgety/restless 0 0 0 0 0  Suicidal thoughts 0 0 0 0 0  PHQ-9  Score 0 0 0 0 0    phq 9 is negative   Fall Risk:    12/17/2021    8:45 AM 11/02/2021    7:39 AM 06/15/2021   10:29 AM 06/04/2021    3:32 PM 05/19/2021   10:20 AM  Fall Risk   Falls in the past year? 0 0 0 0 0  Number falls in past yr: 0 0 0 0 0  Injury with Fall? 0 0 0  0  Risk for fall due to : No Fall Risks No Fall Risks No Fall Risks  No Fall Risks  Follow up Falls prevention discussed Falls prevention discussed Falls prevention discussed  Falls prevention discussed      Functional Status Survey: Is the patient deaf or have difficulty hearing?: Yes Does the patient have difficulty seeing, even when wearing glasses/contacts?: No Does the patient have difficulty concentrating, remembering, or making decisions?: No Does the patient have difficulty walking or climbing stairs?: No Does the patient have difficulty dressing or bathing?: No Does the patient have difficulty doing errands alone such as visiting a doctor's office or shopping?: No    Assessment & Plan  1. Atherosclerosis of aorta (HCC)  - atorvastatin (LIPITOR) 80 MG tablet; Take 1 tablet (80 mg total) by mouth every evening.  Dispense: 80 tablet; Refill: 0  2. Cerebral microvascular disease  - atorvastatin (LIPITOR) 80 MG tablet; Take 1 tablet (80 mg total) by mouth every evening.  Dispense: 80 tablet; Refill: 0  3. Senile purpura ([place])   4. History of TIA (transient ischemic attack)   5. Need for immunization against influenza  - Flu Vaccine QUAD High Dose(Fluad)  6. Gastro-esophageal reflux disease without esophagitis  - pantoprazole (PROTONIX) 40 MG tablet; Take 1 tablet (40 mg total) by mouth daily. In place of omeprazole since she is taking  plavix  Dispense: 90 tablet; Refill: 1  7. Pure hypercholesterolemia   8. Dyslipidemia  - atorvastatin (LIPITOR) 80 MG tablet; Take 1 tablet (80 mg total) by mouth every evening.  Dispense: 80 tablet; Refill: 0  9. B12 deficiency   10. Vitamin D  deficiency   11. Acquired hypothyroidism   12. Age-related osteoporosis without current pathological fracture   13. Anemia, unspecified type  - POC Hemoccult Bld/Stl (3-Cd Home Screen); Future

## 2021-12-17 ENCOUNTER — Encounter: Payer: Self-pay | Admitting: Family Medicine

## 2021-12-17 ENCOUNTER — Ambulatory Visit (INDEPENDENT_AMBULATORY_CARE_PROVIDER_SITE_OTHER): Payer: Medicare Other | Admitting: Family Medicine

## 2021-12-17 VITALS — BP 110/64 | HR 79 | Resp 16 | Ht 60.0 in | Wt 102.0 lb

## 2021-12-17 DIAGNOSIS — K219 Gastro-esophageal reflux disease without esophagitis: Secondary | ICD-10-CM

## 2021-12-17 DIAGNOSIS — D649 Anemia, unspecified: Secondary | ICD-10-CM

## 2021-12-17 DIAGNOSIS — E538 Deficiency of other specified B group vitamins: Secondary | ICD-10-CM

## 2021-12-17 DIAGNOSIS — E7800 Pure hypercholesterolemia, unspecified: Secondary | ICD-10-CM | POA: Diagnosis not present

## 2021-12-17 DIAGNOSIS — I700 Atherosclerosis of aorta: Secondary | ICD-10-CM | POA: Diagnosis not present

## 2021-12-17 DIAGNOSIS — I6789 Other cerebrovascular disease: Secondary | ICD-10-CM | POA: Diagnosis not present

## 2021-12-17 DIAGNOSIS — Z8673 Personal history of transient ischemic attack (TIA), and cerebral infarction without residual deficits: Secondary | ICD-10-CM

## 2021-12-17 DIAGNOSIS — E039 Hypothyroidism, unspecified: Secondary | ICD-10-CM

## 2021-12-17 DIAGNOSIS — M810 Age-related osteoporosis without current pathological fracture: Secondary | ICD-10-CM | POA: Diagnosis not present

## 2021-12-17 DIAGNOSIS — E785 Hyperlipidemia, unspecified: Secondary | ICD-10-CM | POA: Diagnosis not present

## 2021-12-17 DIAGNOSIS — D692 Other nonthrombocytopenic purpura: Secondary | ICD-10-CM

## 2021-12-17 DIAGNOSIS — Z23 Encounter for immunization: Secondary | ICD-10-CM | POA: Diagnosis not present

## 2021-12-17 DIAGNOSIS — E559 Vitamin D deficiency, unspecified: Secondary | ICD-10-CM | POA: Diagnosis not present

## 2021-12-17 MED ORDER — IRON (FERROUS SULFATE) 325 (65 FE) MG PO TABS: 325.0000 mg | ORAL_TABLET | Freq: Every day | ORAL | 0 refills | Status: DC

## 2021-12-17 MED ORDER — ATORVASTATIN CALCIUM 80 MG PO TABS: 80.0000 mg | ORAL_TABLET | Freq: Every evening | ORAL | 0 refills | Status: DC

## 2021-12-17 MED ORDER — PANTOPRAZOLE SODIUM 40 MG PO TBEC: 40.0000 mg | DELAYED_RELEASE_TABLET | Freq: Every day | ORAL | 1 refills | Status: DC

## 2022-01-09 ENCOUNTER — Other Ambulatory Visit: Payer: Self-pay | Admitting: Family Medicine

## 2022-01-09 DIAGNOSIS — R053 Chronic cough: Secondary | ICD-10-CM

## 2022-01-09 DIAGNOSIS — K219 Gastro-esophageal reflux disease without esophagitis: Secondary | ICD-10-CM

## 2022-01-20 ENCOUNTER — Other Ambulatory Visit: Payer: Self-pay | Admitting: Family Medicine

## 2022-01-20 DIAGNOSIS — H40003 Preglaucoma, unspecified, bilateral: Secondary | ICD-10-CM | POA: Diagnosis not present

## 2022-01-20 DIAGNOSIS — Z1231 Encounter for screening mammogram for malignant neoplasm of breast: Secondary | ICD-10-CM

## 2022-01-24 ENCOUNTER — Encounter: Payer: Self-pay | Admitting: Family Medicine

## 2022-01-25 DIAGNOSIS — M810 Age-related osteoporosis without current pathological fracture: Secondary | ICD-10-CM | POA: Diagnosis not present

## 2022-01-26 NOTE — Progress Notes (Unsigned)
Name: Heather Singh   MRN: [id number]    DOB: 1945/04/28   Date:01/27/2022       Progress Note  Subjective  Chief Complaint  Leg Swelling  HPI  Lower extremity edema: her son died two months ago, she stopped going to her [place] [employer] classes and has not been walking like she used to. She has been sitting more looking though her son's things at their house or standing. No longer working in the yard or going to her classes. She has noticed swelling on both ankles usually at the end of the day. No SOB, decrease in exercise tolerance, wheezing, palpitation, orthopnea.    Patient Active Problem List   Diagnosis Date Noted   TIA (transient ischemic attack) 10/24/2021   Cerebral microvascular disease 04/13/2021   Hx of adenomatous colonic polyps 05/19/2018   Senile purpura ([place]) 04/28/2018   Closed compression fracture of L3 lumbar vertebra, sequela 08/23/2016   Atrophic vaginitis 06/05/2015   Acquired hypothyroidism 10/19/2014   Perennial allergic rhinitis 10/19/2014   B12 deficiency 10/19/2014   Fibrocystic breast disease 10/19/2014   GERD without esophagitis 10/19/2014   OP (osteoporosis) 10/19/2014   Ovarian failure 10/19/2014   Hiatal hernia 10/19/2014   Restless legs syndrome 10/19/2014   Dyslipidemia 08/23/2008   Hyperlipidemia 08/23/2008   Vitamin D deficiency 10/10/2007    Past Surgical History:  Procedure Laterality Date   BREAST CYST ASPIRATION Left    BREAST EXCISIONAL BIOPSY Left 1980's   neg   CATARACT EXTRACTION     EYE SURGERY     left   KYPHOPLASTY N/A 08/19/2016   Procedure: KYPHOPLASTY L3,L4;  Surgeon: Hessie Knows, MD;  Location: [facility];  Service: Orthopedics;  Laterality: N/A;   KYPHOPLASTY N/A 09/21/2016   Procedure: [id number];  Surgeon: Hessie Knows, MD;  Location: [facility];  Service: Orthopedics;  Laterality: N/A;   MOUTH SURGERY     POLYPECTOMY     SLING BLADE PROCEDURE     TOTAL ABDOMINAL HYSTERECTOMY      Family History  Problem Relation  Age of Onset   Transient ischemic attack Mother    Atrial fibrillation Mother    Arthritis Mother        OA   Heart disease Father        CHF   CAD Father    Hyperlipidemia Sister    Arthritis Sister        OA   Stroke Sister        Lived in a nursing home and died from COVID-86   Arthritis Sister        OA   Drug abuse Son    Hypertension Son    Liver disease Son    Alcoholism Son    Breast cancer Maternal Aunt    Breast cancer Paternal Aunt    Heart failure Other    Coronary artery disease Other    Stroke Other    Hyperlipidemia Other    Hypertension Other    Thyroid disease Other     Social History   Tobacco Use   Smoking status: Never   Smokeless tobacco: Never  Substance Use Topics   Alcohol use: No    Alcohol/week: 0.0 standard drinks of alcohol     Current Outpatient Medications:    aspirin 81 MG EC tablet, Take 1 tablet (81 mg total) by mouth daily. Swallow whole., Disp: 30 tablet, Rfl: 12   atorvastatin (LIPITOR) 80 MG tablet, Take 1 tablet (80 mg total) by  mouth every evening., Disp: 80 tablet, Rfl: 0   calcium carbonate (TUMS EX) 750 MG chewable tablet, Chew 1 tablet by mouth daily., Disp: , Rfl:    Cholecalciferol (VITAMIN D) 2000 units CAPS, Take 1 capsule by mouth daily., Disp: , Rfl:    clopidogrel (PLAVIX) 75 MG tablet, Take 1 tablet (75 mg total) by mouth daily., Disp: 21 tablet, Rfl: 0   denosumab (PROLIA) 60 MG/ML SOSY injection, Inject 60 mg into the skin every 6 (six) months., Disp: , Rfl:    fexofenadine (ALLEGRA ALLERGY) 180 MG tablet, Take 1 tablet (180 mg total) by mouth daily., Disp: 90 tablet, Rfl: 0   Iron, Ferrous Sulfate, 325 (65 Fe) MG TABS, Take 325 mg by mouth daily., Disp: 30 tablet, Rfl: 0   levothyroxine (SYNTHROID) 25 MCG tablet, TAKE 1 TO 2 TABLETS BY MOUTH DAILY BEFORE BREAKFAST. ALTERNATE EVERY OTHER DAY, Disp: 135 tablet, Rfl: 1   Magnesium 400 MG TABS, Take 1 tablet by mouth daily., Disp: , Rfl:    pantoprazole (PROTONIX) 40  MG tablet, Take 1 tablet (40 mg total) by mouth daily. In place of omeprazole since she is taking plavix, Disp: 90 tablet, Rfl: 1   vitamin B-12 (CYANOCOBALAMIN) 1000 MCG tablet, Take 1,000 mcg by mouth 3 (three) times a week., Disp: , Rfl:   Allergies  Allergen Reactions   Alendronate     Dyspepsia, inflamed esophagus    Levofloxacin Hives   Nitrofurantoin Monohyd Macro Hives   Sulfamethoxazole-Trimethoprim Hives   Etodolac Hives   Penicillins Hives and Rash    Has patient had a PCN reaction causing immediate rash, facial/tongue/throat swelling, SOB or lightheadedness with hypotension: No Has patient had a PCN reaction causing severe rash involving mucus membranes or skin necrosis: No Has patient had a PCN reaction that required hospitalization: No Has patient had a PCN reaction occurring within the last 10 years: No If all of the above answers are "NO", then may proceed with Cephalosporin use.     I personally reviewed active problem list, medication list, allergies, family history, social history, health maintenance with the patient/caregiver today.   ROS  Ten systems reviewed and is negative except as mentioned in HPI   Objective  Vitals:   01/27/22 0758  BP: 122/70  Pulse: 81  Resp: 16  SpO2: 99%  Weight: 104 lb (47.2 kg)  Height: 5' (1.524 m)    Body mass index is 20.31 kg/m.  Physical Exam  Constitutional: Patient appears well-developed and well-nourished.  No distress.  HEENT: head atraumatic, normocephalic, pupils equal and reactive to light, neck supple Cardiovascular: Normal rate, regular rhythm and normal heart sounds.  No murmur heard. Trace BLE edema. Pulmonary/Chest: Effort normal and breath sounds normal. No respiratory distress. Abdominal: Soft.  There is no tenderness. Psychiatric: Patient has a normal mood and affect. behavior is normal. Judgment and thought content normal.    PHQ2/9:    01/27/2022    7:58 AM 12/17/2021    8:45 AM 11/02/2021     7:39 AM 06/15/2021   10:30 AM 05/19/2021   10:18 AM  Depression screen PHQ 2/9  Decreased Interest 0 0 0 0 0  Down, Depressed, Hopeless 0 0 0 0 0  PHQ - 2 Score 0 0 0 0 0  Altered sleeping 0 0 0 0 0  Tired, decreased energy 0 0 0 0 0  Change in appetite 0 0 0 0 0  Feeling bad or failure about yourself  0 0 0 0  0  Trouble concentrating 0 0 0 0 0  Moving slowly or fidgety/restless 0 0 0 0 0  Suicidal thoughts 0 0 0 0 0  PHQ-9 Score 0 0 0 0 0    phq 9 is negative   Fall Risk:    01/27/2022    7:58 AM 12/17/2021    8:45 AM 11/02/2021    7:39 AM 06/15/2021   10:29 AM 06/04/2021    3:32 PM  Fall Risk   Falls in the past year? 0 0 0 0 0  Number falls in past yr: 0 0 0 0 0  Injury with Fall? 0 0 0 0   Risk for fall due to : No Fall Risks No Fall Risks No Fall Risks No Fall Risks   Follow up Falls prevention discussed Falls prevention discussed Falls prevention discussed Falls prevention discussed       Functional Status Survey: Is the patient deaf or have difficulty hearing?: Yes Does the patient have difficulty seeing, even when wearing glasses/contacts?: No Does the patient have difficulty concentrating, remembering, or making decisions?: No Does the patient have difficulty walking or climbing stairs?: No Does the patient have difficulty dressing or bathing?: No Does the patient have difficulty doing errands alone such as visiting a doctor's office or shopping?: No    Assessment & Plan  1. Bilateral lower extremity edema  Discussed compression stocking hoses and elevation of legs, resume regular physical activity

## 2022-01-27 ENCOUNTER — Ambulatory Visit (INDEPENDENT_AMBULATORY_CARE_PROVIDER_SITE_OTHER): Payer: Medicare Other | Admitting: Family Medicine

## 2022-01-27 ENCOUNTER — Encounter: Payer: Self-pay | Admitting: Family Medicine

## 2022-01-27 VITALS — BP 122/70 | HR 81 | Resp 16 | Ht 60.0 in | Wt 104.0 lb

## 2022-01-27 DIAGNOSIS — R600 Localized edema: Secondary | ICD-10-CM

## 2022-01-28 ENCOUNTER — Other Ambulatory Visit: Payer: Self-pay | Admitting: Family Medicine

## 2022-01-28 DIAGNOSIS — K219 Gastro-esophageal reflux disease without esophagitis: Secondary | ICD-10-CM

## 2022-02-01 ENCOUNTER — Telehealth: Payer: Self-pay | Admitting: Family Medicine

## 2022-02-01 ENCOUNTER — Other Ambulatory Visit: Payer: Self-pay | Admitting: Family Medicine

## 2022-02-01 DIAGNOSIS — I6789 Other cerebrovascular disease: Secondary | ICD-10-CM

## 2022-02-01 DIAGNOSIS — E785 Hyperlipidemia, unspecified: Secondary | ICD-10-CM

## 2022-02-01 DIAGNOSIS — I700 Atherosclerosis of aorta: Secondary | ICD-10-CM

## 2022-02-01 MED ORDER — ATORVASTATIN CALCIUM 80 MG PO TABS: 80.0000 mg | ORAL_TABLET | Freq: Every evening | ORAL | 0 refills | Status: DC

## 2022-02-01 NOTE — Telephone Encounter (Unsigned)
Copied from [place] [phone] [phone]. Topic: General - Other >> Feb 01, 2022  1:57 PM Heather Singh wrote: Reason for CRM: pt states she normally receives a 90 day supply of atorvastatin (LIPITOR) 80 MG tablet  Pt inquiring why new Rx is for 80 tablets  Please fu w/ pt

## 2022-02-11 DIAGNOSIS — Z8673 Personal history of transient ischemic attack (TIA), and cerebral infarction without residual deficits: Secondary | ICD-10-CM | POA: Diagnosis not present

## 2022-02-11 DIAGNOSIS — R200 Anesthesia of skin: Secondary | ICD-10-CM | POA: Diagnosis not present

## 2022-02-11 DIAGNOSIS — R410 Disorientation, unspecified: Secondary | ICD-10-CM | POA: Diagnosis not present

## 2022-02-12 ENCOUNTER — Ambulatory Visit (LOCAL_COMMUNITY_HEALTH_CENTER): Payer: Medicare Other

## 2022-02-12 DIAGNOSIS — Z23 Encounter for immunization: Secondary | ICD-10-CM

## 2022-02-12 DIAGNOSIS — Z7185 Encounter for immunization safety counseling: Secondary | ICD-10-CM

## 2022-02-12 NOTE — Progress Notes (Signed)
  Are you feeling sick today? No   Have you ever received a dose of COVID-19 Vaccine? [employer], [place], [place], [place], Other) Yes  If yes, which vaccine and how many doses?   Pfizer and 5 doses   Did you bring the vaccination record card or other documentation?  Yes   Do you have a health condition or are undergoing treatment that makes you moderately or severely immunocompromised? This would include, but not be limited to: cancer, HIV, organ transplant, immunosuppressive therapy/high-dose corticosteroids, or moderate/severe primary immunodeficiency.  No  Have you received COVID-19 vaccine before or during hematopoietic cell transplant (HCT) or CAR-T-cell therapies? No  Have you ever had an allergic reaction to: (This would include a severe allergic reaction or a reaction that caused hives, swelling, or respiratory distress, including wheezing.) A component of a COVID-19 vaccine or a previous dose of COVID-19 vaccine? No   Have you ever had an allergic reaction to another vaccine (other thanCOVID-19 vaccine) or an injectable medication? (This would include a severe allergic reaction or a reaction that caused hives, swelling, or respiratory distress, including wheezing.)   No    Do you have a history of any of the following:  Myocarditis or Pericarditis No  Dermal fillers:  No  Multisystem Inflammatory Syndrome (MIS-C or MIS-A)? No  COVID-19 disease within the past 3 months? No  Vaccinated with monkeypox vaccine in the last 4 weeks? No  Tolerated Pfizer Comirnaty 2023-24 vaccine well today. Stayed for 15 min observation without problem. Updated [facility] copy and covid card given. Josie Saunders, RN

## 2022-02-19 ENCOUNTER — Ambulatory Visit
Admission: RE | Admit: 2022-02-19 | Discharge: 2022-02-19 | Disposition: A | Discharge status: Home / Self Care | Payer: Medicare Other | Source: Ambulatory Visit | Attending: Family Medicine | Admitting: Family Medicine

## 2022-02-19 DIAGNOSIS — Z1231 Encounter for screening mammogram for malignant neoplasm of breast: Secondary | ICD-10-CM | POA: Diagnosis not present

## 2022-03-01 DIAGNOSIS — H60332 Swimmer's ear, left ear: Secondary | ICD-10-CM | POA: Diagnosis not present

## 2022-03-01 DIAGNOSIS — H903 Sensorineural hearing loss, bilateral: Secondary | ICD-10-CM | POA: Diagnosis not present

## 2022-03-01 DIAGNOSIS — H6123 Impacted cerumen, bilateral: Secondary | ICD-10-CM | POA: Diagnosis not present

## 2022-03-01 NOTE — Progress Notes (Unsigned)
Name: Heather Singh   MRN: [id number]    DOB: [birth year]   Date:03/02/2022       Progress Note  Subjective  Chief Complaint  Follow Up  HPI   History of TIA/microvascular disease: had an episode of confusion in January 2023 during COVID-19 and two episodes in August 23 , currently on aspirin , plavix and Atorvastatin 80 mg, tolerating medication well, LDL at goal, seen by Dr. Melrose Nakayama and advised to continue taking plavix for at least 2 years   Osteoporosis: she has been seeing Dr. Gabriel Carina, [place] started 10/2016. She is tolerating medication well, she had a bone density done by Dr. Gabriel Carina had her first Prolia Fall 2020, last one Nov 2023  GERD and hiatal hernia : Has been seeing Dr. Tami Ribas and he advised  Omeprazole 62m, we change to Pantoprazole when she started to take Plavix and she is still responding well to therapy .Says symptoms have been well controlled without heartburn  or indigestion.  Still has a dry cough that is stable and chronic .  She saw pulmonologist , Dr. FRaul DelFall 2021 symptoms are stable.    Hypothyroidism: she is taking levothyroxine as prescribed, taking one tablet one day and two every other day, TSH has been stable for a while. No change in bowel movements , dry skin skin is stable. We will recheck TSH during her next visit    Hyperlipidemia/Atherosclerosis of aorta : She is on Atorvastatin and last LDL was at goal at 70    Senile purpura: stable, usually on arms , reassurance given again, explained aspirin and plavix will make it worse   Chronic constipation: doing well on Miralax , se is now taking it a few times a week otherwise gives her diarrhea . Unchanged    Vitamin B12 deficiency and vitamin D deficiency continue supplements   Mild anemia: colonoscopy is due in 2025, ferritin is dropping , she took iron pills for one month, no side effects, she asked to skip labs today, we will give 90 days of iron supplements, return for labs and hemoccult stools    Patient Active Problem List   Diagnosis Date Noted   TIA (transient ischemic attack) 10/24/2021   Cerebral microvascular disease 04/13/2021   Hx of adenomatous colonic polyps 05/19/2018   [place]ile purpura ([place] 04/28/2018   Closed compression fracture of L3 lumbar vertebra, sequela 08/23/2016   Atrophic vaginitis 06/05/2015   Acquired hypothyroidism 10/19/2014   Perennial allergic rhinitis 10/19/2014   B12 deficiency 10/19/2014   Fibrocystic breast disease 10/19/2014   GERD without esophagitis 10/19/2014   OP (osteoporosis) 10/19/2014   Ovarian failure 10/19/2014   Hiatal hernia 10/19/2014   Restless legs syndrome 10/19/2014   Dyslipidemia 08/23/2008   Hyperlipidemia 08/23/2008   Vitamin D deficiency 10/10/2007    Past Surgical History:  Procedure Laterality Date   BREAST CYST ASPIRATION Left    BREAST EXCISIONAL BIOPSY Left 1980's   neg   CATARACT EXTRACTION     EYE SURGERY     left   KYPHOPLASTY N/A 08/19/2016   Procedure: KYPHOPLASTY L3,L4;  Surgeon: MHessie Knows MD;  Location: [facility];  Service: Orthopedics;  Laterality: N/A;   KYPHOPLASTY N/A 09/21/2016   Procedure: [id number]  Surgeon: MHessie Knows MD;  Location: [facility];  Service: Orthopedics;  Laterality: N/A;   MOUTH SURGERY     POLYPECTOMY     SLING BLADE PROCEDURE     TOTAL ABDOMINAL HYSTERECTOMY      Family History  Problem  Relation Age of Onset   Transient ischemic attack Mother    Atrial fibrillation Mother    Arthritis Mother        OA   Heart disease Father        CHF   CAD Father    Hyperlipidemia Sister    Arthritis Sister        OA   Stroke Sister        Lived in a nursing home and died from COVID-37   Arthritis Sister        OA   Drug abuse Son    Hypertension Son    Liver disease Son    Alcoholism Son    Breast cancer Maternal Aunt    Breast cancer Paternal Aunt    Heart failure Other    Coronary artery disease Other    Stroke Other    Hyperlipidemia Other     Hypertension Other    Thyroid disease Other     Social History   Tobacco Use   Smoking status: Never   Smokeless tobacco: Never  Substance Use Topics   Alcohol use: No    Alcohol/week: 0.0 standard drinks of alcohol     Current Outpatient Medications:    aspirin 81 MG EC tablet, Take 1 tablet (81 mg total) by mouth daily. Swallow whole., Disp: 30 tablet, Rfl: 12   atorvastatin (LIPITOR) 80 MG tablet, Take 1 tablet (80 mg total) by mouth every evening., Disp: 90 tablet, Rfl: 0   calcium carbonate (TUMS EX) 750 MG chewable tablet, Chew 1 tablet by mouth daily., Disp: , Rfl:    Cholecalciferol (VITAMIN D) 2000 units CAPS, Take 1 capsule by mouth daily., Disp: , Rfl:    clopidogrel (PLAVIX) 75 MG tablet, Take 1 tablet (75 mg total) by mouth daily., Disp: 21 tablet, Rfl: 0   denosumab (PROLIA) 60 MG/ML SOSY injection, Inject 60 mg into the skin every 6 (six) months., Disp: , Rfl:    fexofenadine (ALLEGRA ALLERGY) 180 MG tablet, Take 1 tablet (180 mg total) by mouth daily., Disp: 90 tablet, Rfl: 0   levothyroxine (SYNTHROID) 25 MCG tablet, TAKE 1 TO 2 TABLETS BY MOUTH DAILY BEFORE BREAKFAST. ALTERNATE EVERY OTHER DAY, Disp: 135 tablet, Rfl: 1   Magnesium 400 MG TABS, Take 1 tablet by mouth daily., Disp: , Rfl:    pantoprazole (PROTONIX) 40 MG tablet, TAKE 1 TABLET BY MOUTH EVERY DAY, IN PLACE OF OMEPRAZOLE, Disp: 90 tablet, Rfl: 1   vitamin B-12 (CYANOCOBALAMIN) 1000 MCG tablet, Take 1,000 mcg by mouth 3 (three) times a week., Disp: , Rfl:   Allergies  Allergen Reactions   Alendronate     Dyspepsia, inflamed esophagus    Levofloxacin Hives   Nitrofurantoin Monohyd Macro Hives   Sulfamethoxazole-Trimethoprim Hives   Etodolac Hives   Penicillins Hives and Rash    Has patient had a PCN reaction causing immediate rash, facial/tongue/throat swelling, SOB or lightheadedness with hypotension: No Has patient had a PCN reaction causing severe rash involving mucus membranes or skin necrosis:  No Has patient had a PCN reaction that required hospitalization: No Has patient had a PCN reaction occurring within the last 10 years: No If all of the above answers are "NO", then may proceed with Cephalosporin use.     I personally reviewed active problem list, medication list, allergies, family history, social history, health maintenance with the patient/caregiver today.   ROS  Ten systems reviewed and is negative except as mentioned in HPI  Objective  Vitals:   03/02/22 1048  BP: 122/72  Pulse: 72  Resp: 16  SpO2: 99%  Weight: 104 lb (47.2 kg)  Height: 5' (1.524 m)    Body mass index is 20.31 kg/m.  Physical Exam  Constitutional: Patient appears well-developed and well-nourished.  No distress.  HEENT: head atraumatic, normocephalic, pupils equal and reactive to light, neck supple Cardiovascular: Normal rate, regular rhythm and normal heart sounds.  No murmur heard. No BLE edema. Pulmonary/Chest: Effort normal and breath sounds normal. No respiratory distress. Abdominal: Soft.  There is no tenderness. Psychiatric: Patient has a normal mood and affect. behavior is normal. Judgment and thought content normal.   Recent Results (from the past 2160 hour(s))  Lipid panel     Status: None   Collection Time: 12/09/21  8:47 AM  Result Value Ref Range   Cholesterol 158 <200 mg/dL   HDL 74 > OR = 50 mg/dL   Triglycerides 65 <150 mg/dL   LDL Cholesterol (Calc) 70 mg/dL (calc)    Comment: Reference range: <100 . Desirable range <100 mg/dL for primary prevention;   <70 mg/dL for patients with CHD or diabetic patients  with > or = 2 CHD risk factors. Marland Kitchen LDL-C is now calculated using the Martin-Hopkins  calculation, which is a validated novel method providing  better accuracy than the Friedewald equation in the  estimation of LDL-C.  Cresenciano Genre et al. Annamaria Helling. 7106;269(48): 2061-2068  (http://education.[facility].com/faq/FAQ164)    Total CHOL/HDL Ratio 2.1 <5.0 (calc)    Non-HDL Cholesterol (Calc) 84 <130 mg/dL (calc)    Comment: For patients with diabetes plus 1 major ASCVD risk  factor, treating to a non-HDL-C goal of <100 mg/dL  (LDL-C of <70 mg/dL) is considered a therapeutic  option.   COMPLETE METABOLIC PANEL WITH GFR     Status: None   Collection Time: 12/09/21  8:47 AM  Result Value Ref Range   Glucose, Bld 81 65 - 99 mg/dL    Comment: .            Fasting reference interval .    BUN 12 7 - 25 mg/dL   Creat 0.85 0.60 - 1.00 mg/dL   eGFR 70 > OR = 60 mL/min/1.85m   BUN/Creatinine Ratio SEE NOTE: 6 - 22 (calc)    Comment:    Not Reported: BUN and Creatinine are within    reference range. .    Sodium 137 135 - 146 mmol/L   Potassium 4.3 3.5 - 5.3 mmol/L   Chloride 100 98 - 110 mmol/L   CO2 30 20 - 32 mmol/L   Calcium 9.5 8.6 - 10.4 mg/dL   Total Protein 6.2 6.1 - 8.1 g/dL   Albumin 4.3 3.6 - 5.1 g/dL   Globulin 1.9 1.9 - 3.7 g/dL (calc)   AG Ratio 2.3 1.0 - 2.5 (calc)   Total Bilirubin 0.5 0.2 - 1.2 mg/dL   Alkaline phosphatase (APISO) 41 37 - 153 U/L   AST 23 10 - 35 U/L   ALT 15 6 - 29 U/L  CBC w/Diff/Platelet     Status: Abnormal   Collection Time: 12/09/21  8:47 AM  Result Value Ref Range   WBC 3.8 3.8 - 10.8 Thousand/uL   RBC 3.48 (L) 3.80 - 5.10 Million/uL   Hemoglobin 11.4 (L) 11.7 - 15.5 g/dL   HCT 34.1 (L) 35.0 - 45.0 %   MCV 98.0 80.0 - 100.0 fL   MCH 32.8 27.0 - 33.0 pg   MCHC  33.4 32.0 - 36.0 g/dL   RDW 12.2 11.0 - 15.0 %   Platelets 209 140 - 400 Thousand/uL   MPV 10.9 7.5 - 12.5 fL   Neutro Abs 2,394 1,500 - 7,800 cells/uL   Lymphs Abs 980 850 - 3,900 cells/uL   Absolute Monocytes 346 200 - 950 cells/uL   Eosinophils Absolute 68 15 - 500 cells/uL   Basophils Absolute 11 0 - 200 cells/uL   Neutrophils Relative % 63 %   Total Lymphocyte 25.8 %   Monocytes Relative 9.1 %   Eosinophils Relative 1.8 %   Basophils Relative 0.3 %  Iron, TIBC and Ferritin Panel     Status: None   Collection Time: 12/09/21   8:47 AM  Result Value Ref Range   Iron 118 45 - 160 mcg/dL   TIBC 337 250 - 450 mcg/dL (calc)   %SAT 35 16 - 45 % (calc)   Ferritin 32 16 - 288 ng/mL    PHQ2/9:    03/02/2022   10:47 AM 01/27/2022    7:58 AM 12/17/2021    8:45 AM 11/02/2021    7:39 AM 06/15/2021   10:30 AM  Depression screen PHQ 2/9  Decreased Interest 0 0 0 0 0  Down, Depressed, Hopeless 0 0 0 0 0  PHQ - 2 Score 0 0 0 0 0  Altered sleeping 0 0 0 0 0  Tired, decreased energy 0 0 0 0 0  Change in appetite 0 0 0 0 0  Feeling bad or failure about yourself  0 0 0 0 0  Trouble concentrating 0 0 0 0 0  Moving slowly or fidgety/restless 0 0 0 0 0  Suicidal thoughts 0 0 0 0 0  PHQ-9 Score 0 0 0 0 0    phq 9 is negative   Fall Risk:    03/02/2022   10:47 AM 01/27/2022    7:58 AM 12/17/2021    8:45 AM 11/02/2021    7:39 AM 06/15/2021   10:29 AM  Fall Risk   Falls in the past year? 0 0 0 0 0  Number falls in past yr: 0 0 0 0 0  Injury with Fall? 0 0 0 0 0  Risk for fall due to : _0   Follow up _1       Functional Status Survey: Is the patient deaf or have difficulty hearing?: Yes Does the patient have difficulty seeing, even when wearing glasses/contacts?: No Does the patient have difficulty concentrating, remembering, or making decisions?: No Does the patient have difficulty walking or climbing stairs?: No Does the patient have difficulty dressing or bathing?: No Does the patient have difficulty doing errands alone such as visiting a doctor's office or shopping?: No    Assessment & Plan  1. Atherosclerosis of aorta ([place])  On statin therapy   2. History of TIA (transient ischemic attack)  - clopidogrel (PLAVIX) 75 MG tablet; Take 1 tablet (75 mg total) by mouth daily.  Dispense: 90 tablet; Refill: 1  3.  Acquired hypothyroidism  - levothyroxine (SYNTHROID) 25 MCG tablet; TAKE 1 TO 2 TABLETS BY MOUTH DAILY BEFORE BREAKFAST. ALTERNATE EVERY OTHER DAY  Dispense: 135 tablet; Refill: 1  4. B12 deficiency   5. Gastro-esophageal reflux disease without esophagitis  Controlled   6. Senile purpura ([place])  Reassurance given  7. Anemia, unspecified type  - Iron, Ferrous Sulfate, 325 (65 Fe) MG TABS; Take 325 mg by mouth daily.  Dispense: 90 tablet; Refill: 0

## 2022-03-02 ENCOUNTER — Encounter: Payer: Self-pay | Admitting: Family Medicine

## 2022-03-02 ENCOUNTER — Ambulatory Visit (INDEPENDENT_AMBULATORY_CARE_PROVIDER_SITE_OTHER): Payer: Medicare Other | Admitting: Family Medicine

## 2022-03-02 VITALS — BP 122/72 | HR 72 | Resp 16 | Ht 60.0 in | Wt 104.0 lb

## 2022-03-02 DIAGNOSIS — Z8673 Personal history of transient ischemic attack (TIA), and cerebral infarction without residual deficits: Secondary | ICD-10-CM | POA: Diagnosis not present

## 2022-03-02 DIAGNOSIS — E039 Hypothyroidism, unspecified: Secondary | ICD-10-CM

## 2022-03-02 DIAGNOSIS — D649 Anemia, unspecified: Secondary | ICD-10-CM | POA: Diagnosis not present

## 2022-03-02 DIAGNOSIS — E538 Deficiency of other specified B group vitamins: Secondary | ICD-10-CM

## 2022-03-02 DIAGNOSIS — I700 Atherosclerosis of aorta: Secondary | ICD-10-CM | POA: Diagnosis not present

## 2022-03-02 DIAGNOSIS — D692 Other nonthrombocytopenic purpura: Secondary | ICD-10-CM | POA: Diagnosis not present

## 2022-03-02 DIAGNOSIS — K219 Gastro-esophageal reflux disease without esophagitis: Secondary | ICD-10-CM | POA: Diagnosis not present

## 2022-03-02 MED ORDER — IRON (FERROUS SULFATE) 325 (65 FE) MG PO TABS: 325.0000 mg | ORAL_TABLET | Freq: Every day | ORAL | 0 refills | Status: DC

## 2022-03-02 MED ORDER — CLOPIDOGREL BISULFATE 75 MG PO TABS: 75.0000 mg | ORAL_TABLET | Freq: Every day | ORAL | 1 refills | Status: DC

## 2022-03-02 MED ORDER — LEVOTHYROXINE SODIUM 25 MCG PO TABS: ORAL_TABLET | ORAL | 1 refills | Status: DC

## 2022-04-06 DIAGNOSIS — H04309 Unspecified dacryocystitis of unspecified lacrimal passage: Secondary | ICD-10-CM | POA: Diagnosis not present

## 2022-04-06 DIAGNOSIS — H1810 Bullous keratopathy, unspecified eye: Secondary | ICD-10-CM | POA: Diagnosis not present

## 2022-04-09 ENCOUNTER — Other Ambulatory Visit: Payer: Self-pay | Admitting: Family Medicine

## 2022-04-09 DIAGNOSIS — E039 Hypothyroidism, unspecified: Secondary | ICD-10-CM

## 2022-04-13 DIAGNOSIS — H1810 Bullous keratopathy, unspecified eye: Secondary | ICD-10-CM | POA: Diagnosis not present

## 2022-04-20 ENCOUNTER — Other Ambulatory Visit: Payer: Self-pay | Admitting: Family Medicine

## 2022-04-20 DIAGNOSIS — I6789 Other cerebrovascular disease: Secondary | ICD-10-CM

## 2022-04-20 DIAGNOSIS — E785 Hyperlipidemia, unspecified: Secondary | ICD-10-CM

## 2022-04-20 DIAGNOSIS — I700 Atherosclerosis of aorta: Secondary | ICD-10-CM

## 2022-05-09 ENCOUNTER — Encounter: Payer: Self-pay | Admitting: Family Medicine

## 2022-05-10 ENCOUNTER — Other Ambulatory Visit: Payer: Self-pay | Admitting: Internal Medicine

## 2022-05-10 DIAGNOSIS — E785 Hyperlipidemia, unspecified: Secondary | ICD-10-CM

## 2022-05-10 DIAGNOSIS — I700 Atherosclerosis of aorta: Secondary | ICD-10-CM

## 2022-05-10 DIAGNOSIS — I6789 Other cerebrovascular disease: Secondary | ICD-10-CM

## 2022-05-10 MED ORDER — ATORVASTATIN CALCIUM 80 MG PO TABS: ORAL_TABLET | ORAL | 0 refills | Status: DC

## 2022-05-11 DIAGNOSIS — H04309 Unspecified dacryocystitis of unspecified lacrimal passage: Secondary | ICD-10-CM | POA: Diagnosis not present

## 2022-05-11 DIAGNOSIS — H40003 Preglaucoma, unspecified, bilateral: Secondary | ICD-10-CM | POA: Diagnosis not present

## 2022-05-11 DIAGNOSIS — H1810 Bullous keratopathy, unspecified eye: Secondary | ICD-10-CM | POA: Diagnosis not present

## 2022-05-18 ENCOUNTER — Ambulatory Visit (INDEPENDENT_AMBULATORY_CARE_PROVIDER_SITE_OTHER): Payer: Medicare Other | Admitting: Dermatology

## 2022-05-18 VITALS — BP 117/72 | HR 79

## 2022-05-18 DIAGNOSIS — Z86018 Personal history of other benign neoplasm: Secondary | ICD-10-CM

## 2022-05-18 DIAGNOSIS — L578 Other skin changes due to chronic exposure to nonionizing radiation: Secondary | ICD-10-CM | POA: Diagnosis not present

## 2022-05-18 DIAGNOSIS — L821 Other seborrheic keratosis: Secondary | ICD-10-CM

## 2022-05-18 DIAGNOSIS — Z85828 Personal history of other malignant neoplasm of skin: Secondary | ICD-10-CM

## 2022-05-18 DIAGNOSIS — D229 Melanocytic nevi, unspecified: Secondary | ICD-10-CM

## 2022-05-18 DIAGNOSIS — Z1283 Encounter for screening for malignant neoplasm of skin: Secondary | ICD-10-CM

## 2022-05-18 DIAGNOSIS — T148XXA Other injury of unspecified body region, initial encounter: Secondary | ICD-10-CM

## 2022-05-18 DIAGNOSIS — D1801 Hemangioma of skin and subcutaneous tissue: Secondary | ICD-10-CM

## 2022-05-18 DIAGNOSIS — L814 Other melanin hyperpigmentation: Secondary | ICD-10-CM | POA: Diagnosis not present

## 2022-05-18 DIAGNOSIS — L820 Inflamed seborrheic keratosis: Secondary | ICD-10-CM

## 2022-05-18 NOTE — Progress Notes (Signed)
Follow-Up Visit   Subjective  Heather Singh is a 77 y.o. female who presents for the following: Annual Exam. Hx of BCC, hx of Dysplastic nevus.   The patient presents for Total-Body Skin Exam (TBSE) for skin cancer screening and mole check.  The patient has spots, moles and lesions to be evaluated, some may be new or changing and the patient has concerns that these could be cancer.    The following portions of the chart were reviewed this encounter and updated as appropriate:       Review of Systems:  No other skin or systemic complaints except as noted in HPI or Assessment and Plan.  Objective  Well appearing patient in no apparent distress; mood and affect are within normal limits.  A full examination was performed including scalp, head, eyes, ears, nose, lips, neck, chest, axillae, abdomen, back, buttocks, bilateral upper extremities, bilateral lower extremities, hands, feet, fingers, toes, fingernails, and toenails. All findings within normal limits unless otherwise noted below.  left upper eyelid margin Stuck-on, waxy, tan-brown papule-- Discussed benign etiology and prognosis.   left nasal tip Pink crusty macule   left spinal mid back Stuck-on, waxy, tan-brown papule -- Discussed benign etiology and prognosis.    Assessment & Plan  Seborrheic keratosis left upper eyelid margin  Reassured benign age-related growth.  Recommend observation.   Excoriation left nasal tip  Excoriation appeared ~ 2 days ago   Watch for changes, if doesn't heal up, return to the office   Inflamed seborrheic keratosis left spinal mid back  Symptomatic, irritating, patient would like treated.   Destruction of lesion - left spinal mid back  Destruction method: cryotherapy   Informed consent: discussed and consent obtained   Lesion destroyed using liquid nitrogen: Yes   Region frozen until ice ball extended beyond lesion: Yes   Outcome: patient tolerated procedure well with no  complications   Post-procedure details: wound care instructions given   Additional details:  Prior to procedure, discussed risks of blister formation, small wound, skin dyspigmentation, or rare scar following cryotherapy. Recommend Vaseline ointment to treated areas while healing.    Lentigines - Scattered tan macules - Due to sun exposure - Benign-appearing, observe - Recommend daily broad spectrum sunscreen SPF 30+ to sun-exposed areas, reapply every 2 hours as needed. - Call for any changes  Seborrheic Keratoses - Stuck-on, waxy, tan-brown papules and/or plaques  - Benign-appearing - Discussed benign etiology and prognosis. - Observe - Call for any changes  Melanocytic Nevi - Tan-brown and/or pink-flesh-colored symmetric macules and papules - Benign appearing on exam today - Observation - Call clinic for new or changing moles - Recommend daily use of broad spectrum spf 30+ sunscreen to sun-exposed areas.   Hemangiomas - Red papules - Discussed benign nature - Observe - Call for any changes  Actinic Damage - Chronic condition, secondary to cumulative UV/sun exposure - diffuse scaly erythematous macules with underlying dyspigmentation - Recommend daily broad spectrum sunscreen SPF 30+ to sun-exposed areas, reapply every 2 hours as needed.  - Staying in the shade or wearing long sleeves, sun glasses (UVA+UVB protection) and wide brim hats (4-inch brim around the entire circumference of the hat) are also recommended for sun protection.  - Call for new or changing lesions.  History of Basal Cell Carcinoma of the Skin L brow, ~23 years ago, tx w/ MOHs - No evidence of recurrence today - Recommend regular full body skin exams - Recommend daily broad spectrum sunscreen SPF 30+  to sun-exposed areas, reapply every 2 hours as needed.  - Call if any new or changing lesions are noted between office visits   History of Dysplastic Nevi Right thigh, mild, 01/17/2009 - No evidence of  recurrence today - Recommend regular full body skin exams - Recommend daily broad spectrum sunscreen SPF 30+ to sun-exposed areas, reapply every 2 hours as needed.  - Call if any new or changing lesions are noted between office visits  Skin cancer screening performed today.   Return in about 1 year (around 05/18/2023) for TBSE, hx of BCC, hx of Dysplastic nevus .  I, Marye Round, CMA, am acting as scribe for Brendolyn Patty, MD .   Documentation: I have reviewed the above documentation for accuracy and completeness, and I agree with the above.  Brendolyn Patty MD

## 2022-05-18 NOTE — Patient Instructions (Addendum)
Cryotherapy Aftercare  Wash gently with soap and water everyday.   Apply Vaseline and Band-Aid daily until healed.        Recommend daily broad spectrum sunscreen SPF 30+ to sun-exposed areas, reapply every 2 hours as needed. Call for new or changing lesions.  Staying in the shade or wearing long sleeves, sun glasses (UVA+UVB protection) and wide brim hats (4-inch brim around the entire circumference of the hat) are also recommended for sun protection.      Due to recent changes in healthcare laws, you may see results of your pathology and/or laboratory studies on MyChart before the doctors have had a chance to review them. We understand that in some cases there may be results that are confusing or concerning to you. Please understand that not all results are received at the same time and often the doctors may need to interpret multiple results in order to provide you with the best plan of care or course of treatment. Therefore, we ask that you please give [place] 2 business days to thoroughly review all your results before contacting the office for clarification. Should we see a critical lab result, you will be contacted sooner.   If You Need Anything After Your Visit  If you have any questions or concerns for your doctor, please call our main line at [phone] and press option 4 to reach your doctor's medical assistant. If no one answers, please leave a voicemail as directed and we will return your call as soon as possible. Messages left after 4 pm will be answered the following business day.   You may also send [place] a message via [place]. We typically respond to MyChart messages within 1-2 business days.  For prescription refills, please ask your pharmacy to contact our office. Our fax number is [phone].  If you have an urgent issue when the clinic is closed that cannot wait until the next business day, you can page your doctor at the number below.    Please note that while we do our  best to be available for urgent issues outside of office hours, we are not available 24/7.   If you have an urgent issue and are unable to reach [place], you may choose to seek medical care at your doctor's office, retail clinic, urgent care center, or emergency room.  If you have a medical emergency, please immediately call 911 or go to the emergency department.  Pager Numbers  - Dr. Nehemiah Massed: [phone]  - Dr. Laurence Ferrari: [phone]  - Dr. Nicole Kindred: [phone]  In the event of inclement weather, please call our main line at [phone] for an update on the status of any delays or closures.  Dermatology Medication Tips: Please keep the boxes that topical medications come in in order to help keep track of the instructions about where and how to use these. Pharmacies typically print the medication instructions only on the boxes and not directly on the medication tubes.   If your medication is too expensive, please contact our office at [phone] option 4 or send [place] a message through [place].   We are unable to tell what your co-pay for medications will be in advance as this is different depending on your insurance coverage. However, we may be able to find a substitute medication at lower cost or fill out paperwork to get insurance to cover a needed medication.   If a prior authorization is required to get your medication covered by your insurance company, please allow [place] 1-2 business  days to complete this process.  Drug prices often vary depending on where the prescription is filled and some pharmacies may offer cheaper prices.  The website www.goodrx.com contains coupons for medications through different pharmacies. The prices here do not account for what the cost may be with help from insurance (it may be cheaper with your insurance), but the website can give you the price if you did not use any insurance.  - You can print the associated coupon and take it with your prescription to the  pharmacy.  - You may also stop by our office during regular business hours and pick up a GoodRx coupon card.  - If you need your prescription sent electronically to a different pharmacy, notify our office through [facility] or by phone at [phone] option 4.     Si Usted Necesita Algo Despus de Su Visita  Tambin puede enviarnos un mensaje a travs de Pharmacist, community. Por lo general respondemos a los mensajes de MyChart en el transcurso de 1 a 2 das hbiles.  Para renovar recetas, por favor pida a su farmacia que se ponga en contacto con nuestra oficina. Harland Dingwall de fax es [place] [phone].  Si tiene un asunto urgente cuando la clnica est cerrada y que no puede esperar hasta el siguiente da hbil, puede llamar/localizar a su doctor(a) al nmero que aparece a continuacin.   Por favor, tenga en cuenta que aunque hacemos todo lo posible para estar disponibles para asuntos urgentes fuera del horario de [place], no estamos disponibles las 24 horas del da, los 7 das de la [place].   Si tiene un problema urgente y no puede comunicarse con nosotros, puede optar por buscar atencin mdica  en el consultorio de su doctor(a), en una clnica privada, en un centro de atencin urgente o en una sala de emergencias.  Si tiene Engineering geologist, por favor llame inmediatamente al 911 o vaya a la sala de emergencias.  Nmeros de bper  - Dr. Nehemiah Massed: [phone]  - Dra. Moye: [phone]  - Dra. Nicole Kindred: [phone]  En caso de inclemencias del [place], por favor llame a Johnsie Kindred principal al [phone] para una actualizacin sobre el [place] de cualquier retraso o cierre.  Consejos para la medicacin en dermatologa: Por favor, guarde las cajas en las que vienen los medicamentos de uso tpico para ayudarle a seguir las instrucciones sobre dnde y cmo usarlos. Las farmacias generalmente imprimen las instrucciones del medicamento slo en las cajas y no directamente en los  tubos del [place].   Si su medicamento es muy caro, por favor, pngase en contacto con Zigmund Daniel llamando al [phone] y presione la opcin 4 o envenos un mensaje a travs de Pharmacist, community.   No podemos decirle cul ser su copago por los medicamentos por adelantado ya que esto es diferente dependiendo de la cobertura de su seguro. Sin embargo, es posible que podamos encontrar un medicamento sustituto a Electrical engineer un formulario para que el seguro cubra el medicamento que se considera necesario.   Si se requiere una autorizacin previa para que su compaa de seguros [place] su medicamento, por favor permtanos de 1 a 2 das hbiles para completar este proceso.  Los precios de los medicamentos varan con frecuencia dependiendo del Environmental consultant de dnde se surte la receta y alguna farmacias pueden ofrecer precios ms baratos.  El sitio web www.goodrx.com tiene cupones para medicamentos de Airline pilot. Los precios aqu no tienen en cuenta lo que podra costar con la  ayuda del seguro (puede ser ms barato con su seguro), pero el sitio web puede darle el precio si no Field seismologist.  - Puede imprimir el cupn correspondiente y llevarlo con su receta a la farmacia.  - Tambin puede pasar por nuestra oficina durante el horario de atencin regular y Charity fundraiser una tarjeta de cupones de GoodRx.  - Si necesita que su receta se enve electrnicamente a una farmacia diferente, informe a nuestra oficina a travs de MyChart de [place] o por telfono llamando al [phone] y presione la opcin 4.

## 2022-05-19 ENCOUNTER — Encounter: Payer: Self-pay | Admitting: Dermatology

## 2022-05-21 ENCOUNTER — Ambulatory Visit (INDEPENDENT_AMBULATORY_CARE_PROVIDER_SITE_OTHER): Payer: Medicare Other

## 2022-05-21 VITALS — BP 126/72 | Ht 61.0 in | Wt 105.5 lb

## 2022-05-21 DIAGNOSIS — Z0000 Encounter for general adult medical examination without abnormal findings: Secondary | ICD-10-CM | POA: Diagnosis not present

## 2022-05-21 NOTE — Patient Instructions (Signed)
Ms. Heather Singh , Thank you for taking time to come for your Medicare Wellness Visit. I appreciate your ongoing commitment to your health goals. Please review the following plan we discussed and let me know if I can assist you in the future.   These are the goals we discussed:  Goals      DIET - INCREASE WATER INTAKE     Recommend drinking 6-8 glasses of water per day        This is a list of the screening recommended for you and due dates:  Health Maintenance  Topic Date Due   COVID-19 Vaccine (7 - 2023-24 season) 04/09/2022   Mammogram  02/20/2023   Medicare Annual Wellness Visit  05/21/2023   Colon Cancer Screening  05/30/2023   DTaP/Tdap/Td vaccine (3 - Td or Tdap) 03/30/2026   Pneumonia Vaccine  Completed   Flu Shot  Completed   DEXA scan (bone density measurement)  Completed   Hepatitis C Screening: USPSTF Recommendation to screen - Ages 78-79 yo.  Completed   Zoster (Shingles) Vaccine  Completed   HPV Vaccine  Aged Out    Advanced directives: yes  Conditions/risks identified: none  Next appointment: Follow up in one year for your annual wellness visit 05/27/2023 '@9am'$  in person   Preventive Care 65 Years and Older, Female Preventive care refers to lifestyle choices and visits with your health care provider that can promote health and wellness. What does preventive care include? A yearly physical exam. This is also called an annual well check. Dental exams once or twice a year. Routine eye exams. Ask your health care provider how often you should have your eyes checked. Personal lifestyle choices, including: Daily care of your teeth and gums. Regular physical activity. Eating a healthy diet. Avoiding tobacco and drug use. Limiting alcohol use. Practicing safe sex. Taking low-dose aspirin every day. Taking vitamin and mineral supplements as recommended by your health care provider. What happens during an annual well check? The services and screenings done by your  health care provider during your annual well check will depend on your age, overall health, lifestyle risk factors, and family history of disease. Counseling  Your health care provider may ask you questions about your: Alcohol use. Tobacco use. Drug use. Emotional well-being. Home and relationship well-being. Sexual activity. Eating habits. History of falls. Memory and ability to understand (cognition). Work and work Statistician. Reproductive health. Screening  You may have the following tests or measurements: Height, weight, and BMI. Blood pressure. Lipid and cholesterol levels. These may be checked every 5 years, or more frequently if you are over 28 years old. Skin check. Lung cancer screening. You may have this screening every year starting at age 64 if you have a 30-pack-year history of smoking and currently smoke or have quit within the past 15 years. Fecal occult blood test (FOBT) of the stool. You may have this test every year starting at age 39. Flexible sigmoidoscopy or colonoscopy. You may have a sigmoidoscopy every 5 years or a colonoscopy every 10 years starting at age 66. Hepatitis C blood test. Hepatitis B blood test. Sexually transmitted disease (STD) testing. Diabetes screening. This is done by checking your blood sugar (glucose) after you have not eaten for a while (fasting). You may have this done every 1-3 years. Bone density scan. This is done to screen for osteoporosis. You may have this done starting at age 50. Mammogram. This may be done every 1-2 years. Talk to your health care provider  about how often you should have regular mammograms. Talk with your health care provider about your test results, treatment options, and if necessary, the need for more tests. Vaccines  Your health care provider may recommend certain vaccines, such as: Influenza vaccine. This is recommended every year. Tetanus, diphtheria, and acellular pertussis (Tdap, Td) vaccine. You may  need a Td booster every 10 years. Zoster vaccine. You may need this after age 68. Pneumococcal 13-valent conjugate (PCV13) vaccine. One dose is recommended after age 31. Pneumococcal polysaccharide (PPSV23) vaccine. One dose is recommended after age 64. Talk to your health care provider about which screenings and vaccines you need and how often you need them. This information is not intended to replace advice given to you by your health care provider. Make sure you discuss any questions you have with your health care provider. Document Released: 03/28/2015 Document Revised: 11/19/2015 Document Reviewed: 12/31/2014 Elsevier Interactive Patient Education  2017 [place] Prevention in the Home Falls can cause injuries. They can happen to people of all ages. There are many things you can do to make your home safe and to help prevent falls. What can I do on the outside of my home? Regularly fix the edges of walkways and driveways and fix any cracks. Remove anything that might make you trip as you walk through a door, such as a raised step or threshold. Trim any bushes or trees on the path to your home. Use bright outdoor lighting. Clear any walking paths of anything that might make someone trip, such as rocks or tools. Regularly check to see if handrails are loose or broken. Make sure that both sides of any steps have handrails. Any raised decks and porches should have guardrails on the edges. Have any leaves, snow, or ice cleared regularly. Use sand or salt on walking paths during winter. Clean up any spills in your garage right away. This includes oil or grease spills. What can I do in the bathroom? Use night lights. Install grab bars by the toilet and in the tub and shower. Do not use towel bars as grab bars. Use non-skid mats or decals in the tub or shower. If you need to sit down in the shower, use a plastic, non-slip stool. Keep the floor dry. Clean up any water that spills on  the floor as soon as it happens. Remove soap buildup in the tub or shower regularly. Attach bath mats securely with double-sided non-slip rug tape. Do not have throw rugs and other things on the floor that can make you trip. What can I do in the bedroom? Use night lights. Make sure that you have a light by your bed that is easy to reach. Do not use any sheets or blankets that are too big for your bed. They should not hang down onto the floor. Have a firm chair that has side arms. You can use this for support while you get dressed. Do not have throw rugs and other things on the floor that can make you trip. What can I do in the kitchen? Clean up any spills right away. Avoid walking on wet floors. Keep items that you use a lot in easy-to-reach places. If you need to reach something above you, use a strong step stool that has a grab bar. Keep electrical cords out of the way. Do not use floor polish or wax that makes floors slippery. If you must use wax, use non-skid floor wax. Do not have throw rugs and  other things on the floor that can make you trip. What can I do with my stairs? Do not leave any items on the stairs. Make sure that there are handrails on both sides of the stairs and use them. Fix handrails that are broken or loose. Make sure that handrails are as long as the stairways. Check any carpeting to make sure that it is firmly attached to the stairs. Fix any carpet that is loose or worn. Avoid having throw rugs at the top or bottom of the stairs. If you do have throw rugs, attach them to the floor with carpet tape. Make sure that you have a light switch at the top of the stairs and the bottom of the stairs. If you do not have them, ask someone to add them for you. What else can I do to help prevent falls? Wear shoes that: Do not have high heels. Have rubber bottoms. Are comfortable and fit you well. Are closed at the toe. Do not wear sandals. If you use a stepladder: Make sure  that it is fully opened. Do not climb a closed stepladder. Make sure that both sides of the stepladder are locked into place. Ask someone to hold it for you, if possible. Clearly mark and make sure that you can see: Any grab bars or handrails. First and last steps. Where the edge of each step is. Use tools that help you move around (mobility aids) if they are needed. These include: Canes. Walkers. Scooters. Crutches. Turn on the lights when you go into a dark area. Replace any light bulbs as soon as they burn out. Set up your furniture so you have a clear path. Avoid moving your furniture around. If any of your floors are uneven, fix them. If there are any pets around you, be aware of where they are. Review your medicines with your doctor. Some medicines can make you feel dizzy. This can increase your chance of falling. Ask your doctor what other things that you can do to help prevent falls. This information is not intended to replace advice given to you by your health care provider. Make sure you discuss any questions you have with your health care provider. Document Released: 12/26/2008 Document Revised: 08/07/2015 Document Reviewed: 04/05/2014 Elsevier Interactive Patient Education  2017 [employer].

## 2022-05-21 NOTE — Progress Notes (Signed)
Subjective:   Heather Singh is a 77 y.o. female who presents for Medicare Annual (Subsequent) preventive examination.  Review of Systems    Cardiac Risk Factors include: advanced age (>41mn, >>1women);dyslipidemia     Objective:    Today's Vitals   05/21/22 0945  Weight: 105 lb 8 oz (47.9 kg)  Height: '5\' 1"'$  (1.549 m)   Body mass index is 19.93 kg/m.     05/21/2022   10:02 AM 10/24/2021    6:13 PM 05/19/2021   10:19 AM 05/15/2020   10:12 AM 05/11/2019   10:11 AM 02/24/2019   12:49 PM 04/28/2018   10:25 AM  Advanced Directives  Does Patient Have a Medical Advance Directive? Yes No Yes Yes Yes No;Yes Yes  Type of AScientist, physiologicalof ADelmarLiving will HPalmona ParkLiving will HSham[place]Living will HStar PrairieLiving will HDallastownLiving will  Copy of HT[place]onin Chart?   No - copy requested Yes - validated most recent copy scanned in chart (See row information) No - copy requested  No - copy requested    Current Medications (verified) Outpatient Encounter Medications as of 05/21/2022  Medication Sig   aspirin 81 MG EC tablet Take 1 tablet (81 mg total) by mouth daily. Swallow whole.   atorvastatin (LIPITOR) 80 MG tablet TAKE 1 TABLET(80 MG) BY MOUTH EVERY EVENING   calcium carbonate (TUMS EX) 750 MG chewable tablet Chew 1 tablet by mouth daily.   Cholecalciferol (VITAMIN D) 2000 units CAPS Take 1 capsule by mouth daily.   clopidogrel (PLAVIX) 75 MG tablet Take 1 tablet (75 mg total) by mouth daily.   denosumab (PROLIA) 60 MG/ML SOSY injection Inject 60 mg into the skin every 6 (six) months.   fexofenadine (ALLEGRA ALLERGY) 180 MG tablet Take 1 tablet (180 mg total) by mouth daily.   Iron, Ferrous Sulfate, 325 (65 Fe) MG TABS Take 325 mg by mouth daily.   levothyroxine (SYNTHROID) 25 MCG tablet TAKE 1 TO 2 TABLETS BY MOUTH DAILY BEFORE BREAKFAST. ALTERNATE EVERY OTHER  DAY   Magnesium 400 MG TABS Take 1 tablet by mouth daily.   pantoprazole (PROTONIX) 40 MG tablet TAKE 1 TABLET BY MOUTH EVERY DAY, IN PLACE OF OMEPRAZOLE   vitamin B-12 (CYANOCOBALAMIN) 1000 MCG tablet Take 1,000 mcg by mouth 3 (three) times a week.   No facility-administered encounter medications on file as of 05/21/2022.    Allergies (verified) Alendronate, Levofloxacin, Nitrofurantoin monohyd macro, Sulfamethoxazole-trimethoprim, Etodolac, and Penicillins   History: Past Medical History:  Diagnosis Date   Allergy    Back pain    Basal cell carcinoma ~2000   L brow, treated with MOHs   Dyslipidemia    Dysplastic nevus 01/17/2009   right thigh, slight atypia    Fibrocystic breast disease    GERD (gastroesophageal reflux disease)    Hyperlipidemia    Menopause    Osteoporosis    Reflux esophagitis    RLS (restless legs syndrome)    Urinary dysfunction    Past Surgical History:  Procedure Laterality Date   BREAST CYST ASPIRATION Left    BREAST EXCISIONAL BIOPSY Left 1980's   neg   CATARACT EXTRACTION     EYE SURGERY     left   KYPHOPLASTY N/A 08/19/2016   Procedure: KYPHOPLASTY L3,L4;  Surgeon: MHessie Knows MD;  Location: [facility] ORS;  Service: Orthopedics;  Laterality: N/A;   KYPHOPLASTY N/A 09/21/2016  Procedure: [id number];  Surgeon: Hessie Knows, MD;  Location: [facility] ORS;  Service: Orthopedics;  Laterality: N/A;   MOUTH SURGERY     POLYPECTOMY     SLING BLADE PROCEDURE     TOTAL ABDOMINAL HYSTERECTOMY     Family History  Problem Relation Age of Onset   Transient ischemic attack Mother    Atrial fibrillation Mother    Arthritis Mother        OA   Heart disease Father        CHF   CAD Father    Hyperlipidemia Sister    Arthritis Sister        OA   Stroke Sister        Lived in a nursing home and died from COVID-56   Arthritis Sister        OA   Drug abuse Son    Hypertension Son    Liver disease Son    Alcoholism Son    Breast cancer Maternal Aunt     Breast cancer Paternal Aunt    Heart failure Other    Coronary artery disease Other    Stroke Other    Hyperlipidemia Other    Hypertension Other    Thyroid disease Other    Social History   Socioeconomic History   Marital status: Married    Spouse name: Not on file   Number of children: 1   Years of education: Not on file   Highest education level: High school graduate  Occupational History   Not on file  Tobacco Use   Smoking status: Never   Smokeless tobacco: Never  Vaping Use   Vaping Use: Never used  Substance and Sexual Activity   Alcohol use: No    Alcohol/week: 0.0 standard drinks of alcohol   Drug use: No   Sexual activity: Yes    Partners: Male  Other Topics Concern   Not on file  Social History Narrative   Married   Gets regular exercise   One grown son having medical problems and moved in with since 2021   Social Determinants of Health   Financial Resource Strain: Low Risk  (05/21/2022)   Overall Financial Resource Strain (CARDIA)    Difficulty of Paying Living Expenses: Not hard at all  Food Insecurity: No Food Insecurity (05/21/2022)   Hunger Vital Sign    Worried About Running Out of Food in the Last Year: Never true    [place] in the Last Year: Never true  Transportation Needs: No Transportation Needs (05/21/2022)   PRAPARE - Hydrologist (Medical): No    Lack of Transportation (Non-Medical): No  Physical Activity: Insufficiently Active (05/21/2022)   Exercise Vital Sign    Days of Exercise per Week: 3 days    Minutes of Exercise per Session: 30 min  Stress: No Stress Concern Present (05/21/2022)   [place]    Feeling of Stress : Not at all  Social Connections: Moderately Integrated (05/21/2022)   Social Connection and Isolation Panel [NHANES]    Frequency of Communication with Friends and Family: Not on file    Frequency of Social Gatherings with  Friends and Family: More than three times a week    Attends Religious Services: More than 4 times per year    Active Member of [employer] or Organizations: No    Attends Archivist Meetings: Never    Marital Status: Married  Tobacco Counseling Counseling given: Not Answered   Clinical Intake:  Pre-visit preparation completed: Yes  Pain : No/denies pain   BMI - recorded: 19.93 Nutritional Status: BMI of 19-24  Normal Nutritional Risks: None Diabetes: No  How often do you need to have someone help you when you read instructions, pamphlets, or other written materials from your doctor or pharmacy?: 1 - Never  Diabetic?NO  Interpreter Needed?: No  Information entered by :: B.Jody Silas,LPN   Activities of Daily Living    05/21/2022   10:02 AM 03/02/2022   10:47 AM  In your present state of health, do you have any difficulty performing the following activities:  Hearing? 0 1  Vision? 0 0  Difficulty concentrating or making decisions? 0 0  Walking or climbing stairs? 0 0  Dressing or bathing? 0 0  Doing errands, shopping? 0 0  Preparing Food and eating ? N   Using the Toilet? N   In the past six months, have you accidently leaked urine? Y   Do you have problems with loss of bowel control? N   Managing your Medications? N   Managing your Finances? N   Housekeeping or managing your Housekeeping? N     Patient Care Team: Steele Sizer, MD as PCP - General (Family Medicine) Beverly Gust, MD as Consulting Physician (Otolaryngology) Gabriel Carina Betsey Holiday, MD as Consulting Physician (Endocrinology) Brendolyn Patty, MD (Dermatology) Sharlotte Alamo, DPM (Podiatry)  Indicate any recent Medical Services you may have received from other than [facility] providers in the past year (date may be approximate).     Assessment:   This is a routine wellness examination for Marticia.  Hearing/Vision screen Hearing Screening - Comments:: Adequate hearing with hearing aides Vision Screening -  Comments:: Adequate w/glasses [facility] Morene Rankins   Dietary issues and exercise activities discussed: Exercise limited by: orthopedic condition(s);cardiac condition(s)   Goals Addressed   None    Depression Screen    05/21/2022    9:54 AM 03/02/2022   10:47 AM 01/27/2022    7:58 AM 12/17/2021    8:45 AM 11/02/2021    7:39 AM 06/15/2021   10:30 AM 05/19/2021   10:18 AM  PHQ 2/9 Scores  PHQ - 2 Score 0 0 0 0 0 0 0  PHQ- 9 Score  0 0 0 0 0 0    Fall Risk    05/21/2022    9:48 AM 03/02/2022   10:47 AM 01/27/2022    7:58 AM 12/17/2021    8:45 AM 11/02/2021    7:39 AM  Fall Risk   Falls in the past year? 0 0 0 0 0  Number falls in past yr: 0 0 0 0 0  Injury with Fall? 0 0 0 0 0  Risk for fall due to : No Fall Risks No Fall Risks No Fall Risks No Fall Risks No Fall Risks  Follow up Falls prevention discussed;Education provided Falls prevention discussed Falls prevention discussed Falls prevention discussed Falls prevention discussed    FALL RISK PREVENTION PERTAINING TO THE HOME:  Any stairs in or around the home? Yes  If so, are there any without handrails? Yes  Home free of loose throw rugs in walkways, pet beds, electrical cords, etc? Yes  Adequate lighting in your home to reduce risk of falls? Yes   ASSISTIVE DEVICES UTILIZED TO PREVENT FALLS:  Life alert? No  Use of a cane, walker or w/c? No  Grab bars in the bathroom? No  Shower chair  or bench in shower? No  Elevated toilet seat or a handicapped toilet? Yes   TIMED UP AND GO:  Was the test performed? Yes .  Length of time to ambulate 10 feet: 8 sec.   Gait steady and fast without use of assistive device  Cognitive Function:        05/21/2022   10:05 AM 05/11/2019   10:14 AM 04/28/2018   10:28 AM  6CIT Screen  What Year? 0 points 0 points 0 points  What month? 0 points 0 points 0 points  What time? 0 points 0 points 0 points  Count back from 20 0 points 0 points 0 points  Months in reverse 0 points 0  points 0 points  Repeat phrase 0 points 0 points 0 points  Total Score 0 points 0 points 0 points    Immunizations Immunization History  Administered Date(s) Administered   COVID-19, mRNA, vaccine(Comirnaty)12 years and older 02/12/2022   Fluad Quad(high Dose 65+) 12/19/2018, 11/23/2019, 01/01/2021, 12/17/2021   Influenza Split 02/25/2009, 11/27/2012   Influenza, High Dose Seasonal PF 11/13/2014, 11/17/2016   Influenza, Seasonal, Injecte, Preservative Fre 12/01/2009, 12/30/2010, 11/19/2011, 12/20/2013   Influenza,inj,Quad PF,6+ Mos 01/07/2016   Influenza-Unspecified 02/02/2018   PFIZER(Purple Top)SARS-COV-2 Vaccination 03/30/2019, 04/20/2019, 12/13/2019, 07/18/2020, 01/16/2021   Pneumococcal Conjugate-13 10/21/2014   Pneumococcal Polysaccharide-23 07/29/2008   Tdap 07/26/2007, 03/30/2016   Zoster Recombinat (Shingrix) 10/21/2017, 01/02/2018   Zoster, Live 02/25/2009    TDAP status: Up to date  Flu Vaccine status: Up to date  Pneumococcal vaccine status: Up to date  Covid-19 vaccine status: Completed vaccines  Qualifies for Shingles Vaccine? Yes   Zostavax completed No   Shingrix Completed?: No.    Education has been provided regarding the importance of this vaccine. Patient has been advised to call insurance company to determine out of pocket expense if they have not yet received this vaccine. Advised may also receive vaccine at local pharmacy or Health Dept. Verbalized acceptance and understanding.  Screening Tests Health Maintenance  Topic Date Due   COVID-19 Vaccine (7 - 2023-24 season) 04/09/2022   MAMMOGRAM  02/20/2023   Medicare Annual Wellness (AWV)  05/21/2023   COLONOSCOPY (Pts 45-76yr Insurance coverage will need to be confirmed)  05/30/2023   DTaP/Tdap/Td (3 - Td or Tdap) 03/30/2026   Pneumonia Vaccine 79 Years old  Completed   INFLUENZA VACCINE  Completed   DEXA SCAN  Completed   Hepatitis C Screening  Completed   Zoster Vaccines- Shingrix  Completed    HPV VACCINES  Aged Out    Health Maintenance  Health Maintenance Due  Topic Date Due   COVID-19 Vaccine (7 - 2023-24 season) 04/09/2022    Colorectal cancer screening: Type of screening: Colonoscopy. Completed yes . Repeat every 5 years  Mammogram status: Completed yes. Repeat every year  Bone Density status: Completed yes. Results reflect: Bone density results: OSTEOPOROSIS. Repeat every 5 years.  Lung Cancer Screening: (Low Dose CT Chest recommended if Age 79-80years, 30 pack-year currently smoking OR have quit w/in 15years.) does not qualify.   Lung Cancer Screening Referral: no  Additional Screening:  Hepatitis C Screening: does not qualify; Completed no  Vision Screening: Recommended annual ophthalmology exams for early detection of glaucoma and other disorders of the eye. Is the patient up to date with their annual eye exam?  Yes  Who is the provider or what is the name of the office in which the patient attends annual eye exams? [facility] If pt is  not established with a provider, would they like to be referred to a provider to establish care? No .   Dental Screening: Recommended annual dental exams for proper oral hygiene  Community Resource Referral / Chronic Care Management: CRR required this visit?  No   CCM required this visit?  No      Plan:     I have personally reviewed and noted the following in the patient's chart:   Medical and social history Use of alcohol, tobacco or illicit drugs  Current medications and supplements including opioid prescriptions. Patient is not currently taking opioid prescriptions. Functional ability and status Nutritional status Physical activity Advanced directives List of other physicians Hospitalizations, surgeries, and ER visits in previous 12 months Vitals Screenings to include cognitive, depression, and falls Referrals and appointments  In addition, I have reviewed and discussed with patient certain preventive  protocols, quality metrics, and best practice recommendations. A written personalized care plan for preventive services as well as general preventive health recommendations were provided to patient.     Roger Shelter, LPN   [id number]   Nurse Notes: pt is doing well:has no concerns or questions at this time.

## 2022-05-25 ENCOUNTER — Ambulatory Visit: Payer: Medicare Other

## 2022-06-07 ENCOUNTER — Other Ambulatory Visit: Payer: Self-pay | Admitting: Family Medicine

## 2022-06-07 ENCOUNTER — Telehealth: Payer: Self-pay

## 2022-06-07 DIAGNOSIS — D649 Anemia, unspecified: Secondary | ICD-10-CM

## 2022-06-07 MED ORDER — IRON (FERROUS SULFATE) 325 (65 FE) MG PO TABS: 325.0000 mg | ORAL_TABLET | Freq: Every day | ORAL | 0 refills | Status: DC

## 2022-06-07 NOTE — Telephone Encounter (Signed)
Called patient and made aware.

## 2022-06-07 NOTE — Telephone Encounter (Signed)
  Copied from [place] [phone]. Topic: General - Other >> Jun 07, 2022 10:00 AM Oley Balm E wrote: Reason for CRM: Pt wants to know if she needs to continue taking the iron that she was prescribed, she has 3 more days left and wants to know if PCP needs to order a refill prior to her appt or what? Please advise     Patient would like to know if she needs a refill prior to her appt? Last iron/CBC lab 12/09/21 and follow-up with [place] on 06/23/22

## 2022-06-07 NOTE — Telephone Encounter (Unsigned)
Copied from [place] [phone]. Topic: General - Other >> Jun 07, 2022 10:00 AM Oley Balm E wrote: Reason for CRM: Pt wants to know if she needs to continue taking the iron that she was prescribed, she has 3 more days left and wants to know if PCP needs to order a refill prior to her appt or what? Please advise

## 2022-06-22 NOTE — Progress Notes (Unsigned)
Name: Heather Singh   MRN: [id number]    DOB: [birth year]   Date:06/22/2022       Progress Note  Subjective  Chief Complaint  Follow Up  HPI  History of TIA/microvascular disease: had an episode of confusion in January 2023 during COVID-19 and two episodes in August 23 , currently on aspirin , plavix and Atorvastatin 80 mg, tolerating medication well, LDL at goal, seen by Dr. Malvin Johns and advised to continue taking plavix for at least 2 years   Osteoporosis: she has been seeing Dr. Tedd Sias, [place] started 10/2016. She is tolerating medication well, she had a bone density done by Dr. Tedd Sias had her first Prolia Fall 2020, last one Nov 2023  GERD and hiatal hernia : Has been seeing Dr. Jenne Campus and he advised  Omeprazole 40mg , we change to Pantoprazole when she started to take Plavix and she is still responding well to therapy .Says symptoms have been well controlled without heartburn  or indigestion.  Still has a dry cough that is stable and chronic .  She saw pulmonologist , Dr. Meredeth Ide Fall 2021 symptoms are stable.    Hypothyroidism: she is taking levothyroxine as prescribed, taking one tablet one day and two every other day, TSH has been stable for a while. No change in bowel movements , dry skin skin is stable. We will recheck TSH during her next visit    Hyperlipidemia/Atherosclerosis of aorta : She is on Atorvastatin and last LDL was at goal at 70    Senile purpura: stable, usually on arms , reassurance given again, explained aspirin and plavix will make it worse   Chronic constipation: doing well on Miralax , se is now taking it a few times a week otherwise gives her diarrhea . Unchanged    Vitamin B12 deficiency and vitamin D deficiency continue supplements   Mild anemia: colonoscopy is due in 2025, ferritin is dropping , she took iron pills for one month, no side effects, she asked to skip labs today, we will give 90 days of iron supplements, return for labs and hemoccult stools    Patient Active Problem List   Diagnosis Date Noted   TIA (transient ischemic attack) 10/24/2021   Cerebral microvascular disease 04/13/2021   Hx of adenomatous colonic polyps 05/19/2018   Senile purpura 04/28/2018   Closed compression fracture of L3 lumbar vertebra, sequela 08/23/2016   Atrophic vaginitis 06/05/2015   Acquired hypothyroidism 10/19/2014   Perennial allergic rhinitis 10/19/2014   B12 deficiency 10/19/2014   Fibrocystic breast disease 10/19/2014   GERD without esophagitis 10/19/2014   OP (osteoporosis) 10/19/2014   Ovarian failure 10/19/2014   Hiatal hernia 10/19/2014   Restless legs syndrome 10/19/2014   Dyslipidemia 08/23/2008   Hyperlipidemia 08/23/2008   Vitamin D deficiency 10/10/2007    Past Surgical History:  Procedure Laterality Date   BREAST CYST ASPIRATION Left    BREAST EXCISIONAL BIOPSY Left 1980's   neg   CATARACT EXTRACTION     EYE SURGERY     left   KYPHOPLASTY N/A 08/19/2016   Procedure: KYPHOPLASTY L3,L4;  Surgeon: Kennedy Bucker, MD;  Location: [facility] ORS;  Service: Orthopedics;  Laterality: N/A;   KYPHOPLASTY N/A 09/21/2016   Procedure: [id number];  Surgeon: Kennedy Bucker, MD;  Location: [facility] ORS;  Service: Orthopedics;  Laterality: N/A;   MOUTH SURGERY     POLYPECTOMY     SLING BLADE PROCEDURE     TOTAL ABDOMINAL HYSTERECTOMY      Family History  Problem Relation Age  of Onset   Transient ischemic attack Mother    Atrial fibrillation Mother    Arthritis Mother        OA   Heart disease Father        CHF   CAD Father    Hyperlipidemia Sister    Arthritis Sister        OA   Stroke Sister        Lived in a nursing home and died from COVID-19   Arthritis Sister        OA   Drug abuse Son    Hypertension Son    Liver disease Son    Alcoholism Son    Breast cancer Maternal Aunt    Breast cancer Paternal Aunt    Heart failure Other    Coronary artery disease Other    Stroke Other    Hyperlipidemia Other    Hypertension  Other    Thyroid disease Other     Social History   Tobacco Use   Smoking status: Never   Smokeless tobacco: Never  Substance Use Topics   Alcohol use: No    Alcohol/week: 0.0 standard drinks of alcohol     Current Outpatient Medications:    aspirin 81 MG EC tablet, Take 1 tablet (81 mg total) by mouth daily. Swallow whole., Disp: 30 tablet, Rfl: 12   atorvastatin (LIPITOR) 80 MG tablet, TAKE 1 TABLET(80 MG) BY MOUTH EVERY EVENING, Disp: 90 tablet, Rfl: 0   calcium carbonate (TUMS EX) 750 MG chewable tablet, Chew 1 tablet by mouth daily., Disp: , Rfl:    Cholecalciferol (VITAMIN D) 2000 units CAPS, Take 1 capsule by mouth daily., Disp: , Rfl:    clopidogrel (PLAVIX) 75 MG tablet, Take 1 tablet (75 mg total) by mouth daily., Disp: 90 tablet, Rfl: 1   denosumab (PROLIA) 60 MG/ML SOSY injection, Inject 60 mg into the skin every 6 (six) months., Disp: , Rfl:    fexofenadine (ALLEGRA ALLERGY) 180 MG tablet, Take 1 tablet (180 mg total) by mouth daily., Disp: 90 tablet, Rfl: 0   Iron, Ferrous Sulfate, 325 (65 Fe) MG TABS, Take 325 mg by mouth daily., Disp: 90 tablet, Rfl: 0   levothyroxine (SYNTHROID) 25 MCG tablet, TAKE 1 TO 2 TABLETS BY MOUTH DAILY BEFORE BREAKFAST. ALTERNATE EVERY OTHER DAY, Disp: 135 tablet, Rfl: 1   Magnesium 400 MG TABS, Take 1 tablet by mouth daily., Disp: , Rfl:    pantoprazole (PROTONIX) 40 MG tablet, TAKE 1 TABLET BY MOUTH EVERY DAY, IN PLACE OF OMEPRAZOLE, Disp: 90 tablet, Rfl: 1   vitamin B-12 (CYANOCOBALAMIN) 1000 MCG tablet, Take 1,000 mcg by mouth 3 (three) times a week., Disp: , Rfl:   Allergies  Allergen Reactions   Alendronate     Dyspepsia, inflamed esophagus    Levofloxacin Hives   Nitrofurantoin Monohyd Macro Hives   Sulfamethoxazole-Trimethoprim Hives   Etodolac Hives   Penicillins Hives and Rash    Has patient had a PCN reaction causing immediate rash, facial/tongue/throat swelling, SOB or lightheadedness with hypotension: No Has patient had a  PCN reaction causing severe rash involving mucus membranes or skin necrosis: No Has patient had a PCN reaction that required hospitalization: No Has patient had a PCN reaction occurring within the last 10 years: No If all of the above answers are "NO", then may proceed with Cephalosporin use.     I personally reviewed active problem list, medication list, allergies, family history, social history, health maintenance with the patient/caregiver  today.   ROS  ***  Objective  There were no vitals filed for this visit.  There is no height or weight on file to calculate BMI.  Physical Exam ***  No results found for this or any previous visit (from the past 2160 hour(s)).   PHQ2/9:    05/21/2022    9:54 AM 03/02/2022   10:47 AM 01/27/2022    7:58 AM 12/17/2021    8:45 AM 11/02/2021    7:39 AM  Depression screen PHQ 2/9  Decreased Interest 0 0 0 0 0  Down, Depressed, Hopeless 0 0 0 0 0  PHQ - 2 Score 0 0 0 0 0  Altered sleeping  0 0 0 0  Tired, decreased energy  0 0 0 0  Change in appetite  0 0 0 0  Feeling bad or failure about yourself   0 0 0 0  Trouble concentrating  0 0 0 0  Moving slowly or fidgety/restless  0 0 0 0  Suicidal thoughts  0 0 0 0  PHQ-9 Score  0 0 0 0    phq 9 is {gen pos [id number]}   Fall Risk:    05/21/2022    9:48 AM 03/02/2022   10:47 AM 01/27/2022    7:58 AM 12/17/2021    8:45 AM 11/02/2021    7:39 AM  Fall Risk   Falls in the past year? 0 0 0 0 0  Number falls in past yr: 0 0 0 0 0  Injury with Fall? 0 0 0 0 0  Risk for fall due to : No Fall Risks No Fall Risks No Fall Risks No Fall Risks No Fall Risks  Follow up Falls prevention discussed;Education provided Falls prevention discussed Falls prevention discussed Falls prevention discussed Falls prevention discussed      Functional Status Survey:      Assessment & Plan  *** There are no diagnoses linked to this encounter.

## 2022-06-23 ENCOUNTER — Ambulatory Visit (INDEPENDENT_AMBULATORY_CARE_PROVIDER_SITE_OTHER): Payer: Medicare Other | Admitting: Family Medicine

## 2022-06-23 ENCOUNTER — Encounter: Payer: Self-pay | Admitting: Family Medicine

## 2022-06-23 VITALS — BP 118/66 | HR 71 | Resp 16 | Ht 61.0 in | Wt 105.0 lb

## 2022-06-23 DIAGNOSIS — E7800 Pure hypercholesterolemia, unspecified: Secondary | ICD-10-CM | POA: Diagnosis not present

## 2022-06-23 DIAGNOSIS — E538 Deficiency of other specified B group vitamins: Secondary | ICD-10-CM | POA: Diagnosis not present

## 2022-06-23 DIAGNOSIS — E559 Vitamin D deficiency, unspecified: Secondary | ICD-10-CM

## 2022-06-23 DIAGNOSIS — I700 Atherosclerosis of aorta: Secondary | ICD-10-CM

## 2022-06-23 DIAGNOSIS — M810 Age-related osteoporosis without current pathological fracture: Secondary | ICD-10-CM | POA: Diagnosis not present

## 2022-06-23 DIAGNOSIS — D692 Other nonthrombocytopenic purpura: Secondary | ICD-10-CM | POA: Diagnosis not present

## 2022-06-23 DIAGNOSIS — D649 Anemia, unspecified: Secondary | ICD-10-CM

## 2022-06-23 DIAGNOSIS — I6789 Other cerebrovascular disease: Secondary | ICD-10-CM | POA: Diagnosis not present

## 2022-06-23 DIAGNOSIS — Z8673 Personal history of transient ischemic attack (TIA), and cerebral infarction without residual deficits: Secondary | ICD-10-CM

## 2022-06-23 DIAGNOSIS — E785 Hyperlipidemia, unspecified: Secondary | ICD-10-CM | POA: Diagnosis not present

## 2022-06-23 DIAGNOSIS — E039 Hypothyroidism, unspecified: Secondary | ICD-10-CM | POA: Diagnosis not present

## 2022-06-23 DIAGNOSIS — K219 Gastro-esophageal reflux disease without esophagitis: Secondary | ICD-10-CM

## 2022-06-23 MED ORDER — CLOPIDOGREL BISULFATE 75 MG PO TABS: 75.0000 mg | ORAL_TABLET | Freq: Every day | ORAL | 1 refills | Status: DC

## 2022-06-23 MED ORDER — ATORVASTATIN CALCIUM 80 MG PO TABS: ORAL_TABLET | ORAL | 1 refills | Status: DC

## 2022-06-23 MED ORDER — IRON (FERROUS SULFATE) 325 (65 FE) MG PO TABS: 325.0000 mg | ORAL_TABLET | Freq: Every day | ORAL | 0 refills | Status: DC

## 2022-06-23 MED ORDER — PANTOPRAZOLE SODIUM 40 MG PO TBEC: DELAYED_RELEASE_TABLET | ORAL | 1 refills | Status: DC

## 2022-06-23 MED ORDER — LEVOTHYROXINE SODIUM 25 MCG PO TABS: ORAL_TABLET | ORAL | 1 refills | Status: DC

## 2022-06-24 DIAGNOSIS — D649 Anemia, unspecified: Secondary | ICD-10-CM | POA: Diagnosis not present

## 2022-06-24 LAB — CBC WITH DIFFERENTIAL/PLATELET
Absolute Monocytes: 377 cells/uL (ref 200–950)
Basophils Absolute: 10 cells/uL (ref 0–200)
Basophils Relative: 0.2 %
Eosinophils Absolute: 41 cells/uL (ref 15–500)
Eosinophils Relative: 0.8 %
HCT: 35.4 % (ref 35.0–45.0)
Hemoglobin: 12 g/dL (ref 11.7–15.5)
Lymphs Abs: 1229 cells/uL (ref 850–3900)
MCH: 33.1 pg — ABNORMAL HIGH (ref 27.0–33.0)
MCHC: 33.9 g/dL (ref 32.0–36.0)
MCV: 97.5 fL (ref 80.0–100.0)
MPV: 10.8 fL (ref 7.5–12.5)
Monocytes Relative: 7.4 %
Neutro Abs: 3443 cells/uL (ref 1500–7800)
Neutrophils Relative %: 67.5 %
Platelets: 229 10*3/uL (ref 140–400)
RBC: 3.63 10*6/uL — ABNORMAL LOW (ref 3.80–5.10)
RDW: 12.1 % (ref 11.0–15.0)
Total Lymphocyte: 24.1 %
WBC: 5.1 10*3/uL (ref 3.8–10.8)

## 2022-06-24 LAB — IRON,TIBC AND FERRITIN PANEL
%SAT: 48 % (calc) — ABNORMAL HIGH (ref 16–45)
Ferritin: 63 ng/mL (ref 16–288)
Iron: 154 ug/dL (ref 45–160)
TIBC: 320 mcg/dL (calc) (ref 250–450)

## 2022-06-24 LAB — TSH: TSH: 2.04 mIU/L (ref 0.40–4.50)

## 2022-06-24 LAB — VITAMIN D 25 HYDROXY (VIT D DEFICIENCY, FRACTURES): Vit D, 25-Hydroxy: 69 ng/mL (ref 30–100)

## 2022-06-24 LAB — B12 AND FOLATE PANEL
Folate: 13.5 ng/mL
Vitamin B-12: 696 pg/mL (ref 200–1100)

## 2022-06-29 LAB — FECAL GLOBIN BY IMMUNOCHEMISTRY
FECAL GLOBIN RESULT:: NOT DETECTED
MICRO NUMBER:: 14824956
SPECIMEN QUALITY:: ADEQUATE

## 2022-07-02 DIAGNOSIS — J340 Abscess, furuncle and carbuncle of nose: Secondary | ICD-10-CM | POA: Diagnosis not present

## 2022-07-05 ENCOUNTER — Ambulatory Visit (INDEPENDENT_AMBULATORY_CARE_PROVIDER_SITE_OTHER): Payer: Medicare Other | Admitting: Physician Assistant

## 2022-07-05 ENCOUNTER — Encounter: Payer: Self-pay | Admitting: Physician Assistant

## 2022-07-05 VITALS — BP 116/64 | HR 87 | Temp 97.8°F | Resp 16 | Ht 61.0 in | Wt 107.1 lb

## 2022-07-05 DIAGNOSIS — R300 Dysuria: Secondary | ICD-10-CM | POA: Diagnosis not present

## 2022-07-05 DIAGNOSIS — R319 Hematuria, unspecified: Secondary | ICD-10-CM

## 2022-07-05 DIAGNOSIS — N390 Urinary tract infection, site not specified: Secondary | ICD-10-CM

## 2022-07-05 LAB — POCT URINALYSIS DIPSTICK
Bilirubin, UA: NEGATIVE
Blood, UA: POSITIVE
Glucose, UA: NEGATIVE
Ketones, UA: NEGATIVE
Nitrite, UA: NEGATIVE
Odor: NORMAL
Protein, UA: NEGATIVE
Spec Grav, UA: 1.015 (ref 1.010–1.025)
Urobilinogen, UA: 0.2 E.U./dL
pH, UA: 6.5 (ref 5.0–8.0)

## 2022-07-05 MED ORDER — CEFDINIR 300 MG PO CAPS: 300.0000 mg | ORAL_CAPSULE | Freq: Two times a day (BID) | ORAL | 0 refills | Status: AC

## 2022-07-05 NOTE — Progress Notes (Signed)
Acute Office Visit   Patient: Heather Singh   DOB: 02/20/1946   77 y.o. Female  MRN: [id number] Visit Date: 07/05/2022  Today's healthcare provider: Oswaldo Conroy Christia Coaxum, PA-C  Introduced myself to the patient as a Secondary school teacher and provided education on APPs in clinical practice.    Chief Complaint  Patient presents with   Urinary Tract Infection    Burning, hematuria and frequency   Subjective    HPI HPI     Urinary Tract Infection    Additional comments: Burning, hematuria and frequency      Last edited by Marcos Eke, CMA on 07/05/2022 11:01 AM.       Concern for UTI  Onset: sudden  Duration: yesterday morning started feeling that something was off  Associated symptoms: blood with wiping, urinary discomfort, burning with urination, mild urinary hesitancy   Vaginal discomfort or bleeding: none  Interventions: none   She reports she is using Gentamicin intranasally from Dr. Jenne Campus for several weeks  She reports she is able to tolerate cephalosporins without issue    Medications: Outpatient Medications Prior to Visit  Medication Sig   aspirin 81 MG EC tablet Take 1 tablet (81 mg total) by mouth daily. Swallow whole.   atorvastatin (LIPITOR) 80 MG tablet TAKE 1 TABLET(80 MG) BY MOUTH EVERY EVENING   calcium carbonate (TUMS EX) 750 MG chewable tablet Chew 1 tablet by mouth daily.   Cholecalciferol (VITAMIN D) 2000 units CAPS Take 1 capsule by mouth daily.   clopidogrel (PLAVIX) 75 MG tablet Take 1 tablet (75 mg total) by mouth daily.   denosumab (PROLIA) 60 MG/ML SOSY injection Inject 60 mg into the skin every 6 (six) months.   fexofenadine (ALLEGRA ALLERGY) 180 MG tablet Take 1 tablet (180 mg total) by mouth daily.   Iron, Ferrous Sulfate, 325 (65 Fe) MG TABS Take 325 mg by mouth daily.   levothyroxine (SYNTHROID) 25 MCG tablet TAKE 1 TO 2 TABLETS BY MOUTH DAILY BEFORE BREAKFAST. ALTERNATE EVERY OTHER DAY   Magnesium 400 MG TABS Take 1 tablet by mouth daily.    pantoprazole (PROTONIX) 40 MG tablet TAKE 1 TABLET BY MOUTH EVERY DAY, IN PLACE OF OMEPRAZOLE   vitamin B-12 (CYANOCOBALAMIN) 1000 MCG tablet Take 1,000 mcg by mouth 3 (three) times a week.   No facility-administered medications prior to visit.    Review of Systems  Constitutional:  Negative for chills and fever.  Genitourinary:  Positive for dysuria, frequency and hematuria. Negative for decreased urine volume, difficulty urinating, flank pain, urgency, vaginal bleeding, vaginal discharge and vaginal pain.       Objective    BP 116/64   Pulse 87   Temp 97.8 F (36.6 C)   Resp 16   Ht  (1.549 m)   Wt 107 lb 1.6 oz (48.6 kg)   SpO2 99%   BMI 20.24 kg/m    Physical Exam Vitals reviewed.  Constitutional:      General: She is awake.     Appearance: Normal appearance. She is well-developed and well-groomed.  HENT:     Head: Normocephalic and atraumatic.  Pulmonary:     Effort: Pulmonary effort is normal.  Abdominal:     General: Abdomen is flat. Bowel sounds are normal.     Tenderness: There is no abdominal tenderness. There is no right CVA tenderness or left CVA tenderness.  Musculoskeletal:     Cervical back: Normal range of motion.  Neurological:  General: No focal deficit present.     Mental Status: She is alert and oriented to person, place, and time. Mental status is at baseline.  Psychiatric:        Attention and Perception: Attention and perception normal.        Mood and Affect: Mood and affect normal.        Speech: Speech normal.        Behavior: Behavior normal. Behavior is cooperative.       Results for orders placed or performed in visit on 07/05/22  POCT urinalysis dipstick  Result Value Ref Range   Color, UA lpight yellow    Clarity, UA clear    Glucose, UA Negative Negative   Bilirubin, UA neg    Ketones, UA neg    Spec Grav, UA 1.015 1.010 - 1.025   Blood, UA pos    pH, UA 6.5 5.0 - 8.0   Protein, UA Negative Negative    Urobilinogen, UA 0.2 0.2 or 1.0 E.U./dL   Nitrite, UA neg    Leukocytes, UA Large (3+) (A) Negative   Appearance clear    Odor normal     Assessment & Plan      No follow-ups on file.      Problem List Items Addressed This Visit   None Visit Diagnoses     Urinary tract infection with hematuria, site unspecified    -  Primary Acute, new concern Patient reports symptoms comprised of the following: dysuria, incomplete voiding, increased urinary frequency since yesterday morning  Results of UA are consistent with UTI - urine sample sent for culture to determine causative organism and susceptibility- results to dictate further management  Results of UA were reviewed with patient during her apt.  Recommend starting Omnicef 300 mg po BID x 5 days given her allergies and UTD recommendations Culture results to determine if a different abx is required  Will provide script - discussed importance of finishing entire course of abx and staying well hydrated while recovering from UTI Reviewed ED precautions with patient Follow up as needed for persistent or worsening symptoms    Relevant Medications   cefdinir (OMNICEF) 300 MG capsule   Burning with urination       Relevant Orders   POCT urinalysis dipstick (Completed)   Urine Culture   Hematuria, unspecified type       Relevant Orders   Urine Culture        No follow-ups on file.   I, Kyo Cocuzza E Belisa Eichholz, PA-C, have reviewed all documentation for this visit. The documentation on 07/05/22 for the exam, diagnosis, procedures, and orders are all accurate and complete.   Jacquelin Hawking, MHS, PA-C [facility]

## 2022-07-06 ENCOUNTER — Encounter: Payer: Self-pay | Admitting: *Deleted

## 2022-07-06 ENCOUNTER — Telehealth: Payer: Self-pay | Admitting: *Deleted

## 2022-07-06 NOTE — Patient Outreach (Signed)
  Care Coordination   Initial Visit Note   07/06/2022 Name: Heather Singh MRN: [id number] DOB: 1945/12/01  Heather Singh is a 77 y.o. year old female who sees Heather Cory, MD for primary care. I spoke with  Heather Singh by phone today.  What matters to the patients health and wellness today?  Staying healthy, seen in PCP office yesterday for UTI.  Taking antibiotics, increasing water and cranberry juice intake.     Goals Addressed             This Visit's Progress    COMPLETED: Care Coordination Activities - no follow up needed       Care Coordination Interventions: Patient interviewed about adult health maintenance status including  Regular eye checkups Regular Dental Care    Blood Pressure    Advised patient to discuss  Colonoscopy    Pneumonia Vaccine Influenza Vaccine with primary care provider  Provided education about age limits for colonoscopy SDOH assessment completed         SDOH assessments and interventions completed:  Yes  SDOH Interventions Today    Flowsheet Row Most Recent Value  SDOH Interventions   Food Insecurity Interventions Intervention Not Indicated  Housing Interventions Intervention Not Indicated  Transportation Interventions Intervention Not Indicated        Care Coordination Interventions:  Yes, provided   Interventions Today    Flowsheet Row Most Recent Value  Chronic Disease   Chronic disease during today's visit Other  [Health maintanence]  General Interventions   General Interventions Discussed/Reviewed General Interventions Discussed, Vaccines, Health Screening, Doctor Visits  Vaccines COVID-19, Flu, Pneumonia, Shingles  Doctor Visits Discussed/Reviewed Doctor Visits Discussed, PCP  Health Screening Colonoscopy  [due for repeat on 2025, concerned that she will have complications, will discuss with GI if neededof if she will age out]  PCP/Specialist Visits Compliance with follow-up visit  Education Interventions    Education Provided Provided Education  Provided Verbal Education On Nutrition, When to see the doctor        Follow up plan: No further intervention required.   Encounter Outcome:  Pt. Visit Completed   Kemper Durie, RN, MSN, [facility] Care Management Care Management Coordinator [phone]

## 2022-07-07 LAB — URINE CULTURE
MICRO NUMBER:: 14857758
SPECIMEN QUALITY:: ADEQUATE

## 2022-07-07 NOTE — Progress Notes (Signed)
Your urine culture demonstrates a bacterial infection. It was susceptible to the antibiotic that we started during your visit. Please finish the entire course of antibiotics as directed and stay well hydrated. Let us know if you have further questions or concerns.

## 2022-07-22 DIAGNOSIS — H60332 Swimmer's ear, left ear: Secondary | ICD-10-CM | POA: Diagnosis not present

## 2022-07-22 DIAGNOSIS — J340 Abscess, furuncle and carbuncle of nose: Secondary | ICD-10-CM | POA: Diagnosis not present

## 2022-07-26 DIAGNOSIS — H40003 Preglaucoma, unspecified, bilateral: Secondary | ICD-10-CM | POA: Diagnosis not present

## 2022-07-28 DIAGNOSIS — M810 Age-related osteoporosis without current pathological fracture: Secondary | ICD-10-CM | POA: Diagnosis not present

## 2022-08-12 DIAGNOSIS — R200 Anesthesia of skin: Secondary | ICD-10-CM | POA: Diagnosis not present

## 2022-08-12 DIAGNOSIS — Z8673 Personal history of transient ischemic attack (TIA), and cerebral infarction without residual deficits: Secondary | ICD-10-CM | POA: Diagnosis not present

## 2022-08-12 DIAGNOSIS — R410 Disorientation, unspecified: Secondary | ICD-10-CM | POA: Diagnosis not present

## 2022-08-12 DIAGNOSIS — R5383 Other fatigue: Secondary | ICD-10-CM | POA: Diagnosis not present

## 2022-08-27 DIAGNOSIS — J340 Abscess, furuncle and carbuncle of nose: Secondary | ICD-10-CM | POA: Diagnosis not present

## 2022-08-27 DIAGNOSIS — H6123 Impacted cerumen, bilateral: Secondary | ICD-10-CM | POA: Diagnosis not present

## 2022-09-03 ENCOUNTER — Other Ambulatory Visit: Payer: Self-pay | Admitting: Family Medicine

## 2022-09-03 DIAGNOSIS — Z8673 Personal history of transient ischemic attack (TIA), and cerebral infarction without residual deficits: Secondary | ICD-10-CM

## 2022-10-04 DIAGNOSIS — H04551 Acquired stenosis of right nasolacrimal duct: Secondary | ICD-10-CM | POA: Diagnosis not present

## 2022-12-03 DIAGNOSIS — Z23 Encounter for immunization: Secondary | ICD-10-CM | POA: Diagnosis not present

## 2022-12-06 ENCOUNTER — Ambulatory Visit
Admission: RE | Admit: 2022-12-06 | Discharge: 2022-12-06 | Disposition: A | Discharge status: Home / Self Care | Payer: Medicare Other | Source: Ambulatory Visit | Attending: Family Medicine | Admitting: Family Medicine

## 2022-12-06 DIAGNOSIS — M810 Age-related osteoporosis without current pathological fracture: Secondary | ICD-10-CM | POA: Diagnosis not present

## 2022-12-09 DIAGNOSIS — M810 Age-related osteoporosis without current pathological fracture: Secondary | ICD-10-CM | POA: Diagnosis not present

## 2022-12-20 DIAGNOSIS — M810 Age-related osteoporosis without current pathological fracture: Secondary | ICD-10-CM | POA: Diagnosis not present

## 2022-12-23 NOTE — Progress Notes (Signed)
Name: Heather Singh   MRN: [id number]    DOB: [birth year]   Date:12/24/2022       Progress Note  Subjective  Chief Complaint  Follow Up  HPI  History of TIA/microvascular disease: had an episode of confusion in January 2023 during COVID-19 and two episodes in August 23 , currently on aspirin , plavix and Atorvastatin 80 mg, tolerating medication well, LDL at goal, seen by Dr. Malvin Johns and advised to continue taking plavix for at least 2 years . Last LDL was down to 70 , we will recheck it yearly   Osteoporosis: she has been seeing Dr. Tedd Sias, [facility] started 10/2016 completed 2 years followed by [facility] started Fall 2020.    GERD and hiatal hernia : Has been seeing Dr. Jenne Campus and he advised  Omeprazole 40mg , we change to Pantoprazole when she started to take Plavix .  Still has a dry cough that is stable and chronic She saw pulmonologist , Dr. Meredeth Ide Fall 2021 . She drinks coffee all morning, she was noticing some epigastric pain and started taking pantoprazole before breakfast and since than symptoms have improved. Discussed cutting down on caffeine intake . She lost a few pounds , trying to eat lunch again, must gain it back by her next follow up   Hypothyroidism: she is taking levothyroxine as prescribed, taking one tablet one day and two every other day, TSH has been stable for a while. No change in bowel movements - stable on Miralax , dry skin skin is stable, noticing more brittle nails lately, we will recheck labs  Hyperlipidemia/Atherosclerosis of aorta : She is on Atorvastatin and last LDL was at goal at 70 We will recheck it today    Senile purpura: stable, usually on arms , reassurance given again, explained aspirin and plavix will make it worse. Stable    Chronic constipation: doing well on Miralax , se is now taking it a few times a week otherwise gives her diarrhea . Unchanged    Vitamin B12 deficiency and vitamin D deficiency continue supplements . Reviewed labs   Mild anemia:  colonoscopy is due in 2025, ferritin is dropping , taking iron, unable to do hemoccult stools since taking Plavix. FIT test negative, she is taking iron and we will recheck labs today   Patient Active Problem List   Diagnosis Date Noted   TIA (transient ischemic attack) 10/24/2021   Cerebral microvascular disease 04/13/2021   Hx of adenomatous colonic polyps 05/19/2018   Senile purpura (HCC) 04/28/2018   Closed compression fracture of L3 lumbar vertebra, sequela 08/23/2016   Atrophic vaginitis 06/05/2015   Acquired hypothyroidism 10/19/2014   Perennial allergic rhinitis 10/19/2014   B12 deficiency 10/19/2014   Fibrocystic breast disease 10/19/2014   GERD without esophagitis 10/19/2014   OP (osteoporosis) 10/19/2014   Ovarian failure 10/19/2014   Hiatal hernia 10/19/2014   Restless legs syndrome 10/19/2014   Dyslipidemia 08/23/2008   Hyperlipidemia 08/23/2008   Vitamin D deficiency 10/10/2007    Past Surgical History:  Procedure Laterality Date   BREAST CYST ASPIRATION Left    BREAST EXCISIONAL BIOPSY Left 1980's   neg   CATARACT EXTRACTION     EYE SURGERY     left   KYPHOPLASTY N/A 08/19/2016   Procedure: KYPHOPLASTY L3,L4;  Surgeon: Kennedy Bucker, MD;  Location: [facility] ORS;  Service: Orthopedics;  Laterality: N/A;   KYPHOPLASTY N/A 09/21/2016   Procedure: [id number];  Surgeon: Kennedy Bucker, MD;  Location: [facility] ORS;  Service: Orthopedics;  Laterality: N/A;  MOUTH SURGERY     POLYPECTOMY     SLING BLADE PROCEDURE     TOTAL ABDOMINAL HYSTERECTOMY      Family History  Problem Relation Age of Onset   Transient ischemic attack Mother    Atrial fibrillation Mother    Arthritis Mother        OA   Heart disease Father        CHF   CAD Father    Hyperlipidemia Sister    Arthritis Sister        OA   Stroke Sister        Lived in a nursing home and died from COVID-19   Arthritis Sister        OA   Drug abuse Son    Hypertension Son    Liver disease Son    Alcoholism  Son    Breast cancer Maternal Aunt    Breast cancer Paternal Aunt    Heart failure Other    Coronary artery disease Other    Stroke Other    Hyperlipidemia Other    Hypertension Other    Thyroid disease Other     Social History   Tobacco Use   Smoking status: Never   Smokeless tobacco: Never  Substance Use Topics   Alcohol use: No    Alcohol/week: 0.0 standard drinks of alcohol     Current Outpatient Medications:    aspirin 81 MG EC tablet, Take 1 tablet (81 mg total) by mouth daily. Swallow whole., Disp: 30 tablet, Rfl: 12   atorvastatin (LIPITOR) 80 MG tablet, TAKE 1 TABLET(80 MG) BY MOUTH EVERY EVENING, Disp: 90 tablet, Rfl: 1   calcium carbonate (TUMS EX) 750 MG chewable tablet, Chew 1 tablet by mouth daily., Disp: , Rfl:    Cholecalciferol (VITAMIN D) 2000 units CAPS, Take 1 capsule by mouth daily., Disp: , Rfl:    clopidogrel (PLAVIX) 75 MG tablet, TAKE 1 TABLET(75 MG) BY MOUTH DAILY, Disp: 90 tablet, Rfl: 1   denosumab (PROLIA) 60 MG/ML SOSY injection, Inject 60 mg into the skin every 6 (six) months., Disp: , Rfl:    fexofenadine (ALLEGRA ALLERGY) 180 MG tablet, Take 1 tablet (180 mg total) by mouth daily., Disp: 90 tablet, Rfl: 0   Iron, Ferrous Sulfate, 325 (65 Fe) MG TABS, Take 325 mg by mouth daily., Disp: 90 tablet, Rfl: 0   levothyroxine (SYNTHROID) 25 MCG tablet, TAKE 1 TO 2 TABLETS BY MOUTH DAILY BEFORE BREAKFAST. ALTERNATE EVERY OTHER DAY, Disp: 135 tablet, Rfl: 1   Magnesium 400 MG TABS, Take 1 tablet by mouth daily., Disp: , Rfl:    pantoprazole (PROTONIX) 40 MG tablet, TAKE 1 TABLET BY MOUTH EVERY DAY, IN PLACE OF OMEPRAZOLE, Disp: 90 tablet, Rfl: 1   vitamin B-12 (CYANOCOBALAMIN) 1000 MCG tablet, Take 1,000 mcg by mouth 3 (three) times a week., Disp: , Rfl:   Allergies  Allergen Reactions   Alendronate     Dyspepsia, inflamed esophagus    Levofloxacin Hives   Nitrofurantoin Monohyd Macro Hives   Sulfamethoxazole-Trimethoprim Hives   Etodolac Hives    Penicillins Hives and Rash    Has patient had a PCN reaction causing immediate rash, facial/tongue/throat swelling, SOB or lightheadedness with hypotension: No Has patient had a PCN reaction causing severe rash involving mucus membranes or skin necrosis: No Has patient had a PCN reaction that required hospitalization: No Has patient had a PCN reaction occurring within the last 10 years: No If all of the above answers  are "NO", then may proceed with Cephalosporin use.     I personally reviewed active problem list, medication list, allergies, family history, social history, health maintenance with the patient/caregiver today.   ROS  Ten systems reviewed and is negative except as mentioned in HPI    Objective  Vitals:   12/24/22 1054  BP: 112/72  Pulse: 81  Resp: 16  SpO2: 97%  Weight: 104 lb (47.2 kg)  Height: 5\' 1"  (1.549 m)    Body mass index is 19.65 kg/m.  Physical Exam  Constitutional: Patient appears well-developed and well-nourished. No distress.  HEENT: head atraumatic, normocephalic, pupils equal and reactive to light, neck supple Cardiovascular: Normal rate, regular rhythm and normal heart sounds.  No murmur heard. No BLE edema. Pulmonary/Chest: Effort normal and breath sounds normal. No respiratory distress. Abdominal: Soft.  There is no tenderness. Psychiatric: Patient has a normal mood and affect. behavior is normal. Judgment and thought content normal.     PHQ2/9:    12/24/2022   10:53 AM 07/05/2022   11:02 AM 06/23/2022    8:57 AM 05/21/2022    9:54 AM 03/02/2022   10:47 AM  Depression screen PHQ 2/9  Decreased Interest 0 0 0 0 0  Down, Depressed, Hopeless 0 0 0 0 0  PHQ - 2 Score 0 0 0 0 0  Altered sleeping 0 0 0  0  Tired, decreased energy 0 0 0  0  Change in appetite 0 0 0  0  Feeling bad or failure about yourself  0 0 0  0  Trouble concentrating 0 0 0  0  Moving slowly or fidgety/restless 0 0 0  0  Suicidal thoughts 0 0 0  0  PHQ-9 Score 0 0 0   0  Difficult doing work/chores  Not difficult at all       phq 9 is negative   Fall Risk:    12/24/2022   10:53 AM 07/05/2022   11:02 AM 06/23/2022    8:57 AM 05/21/2022    9:48 AM 03/02/2022   10:47 AM  Fall Risk   Falls in the past year? 0 0 0 0 0  Number falls in past yr: 0 0 0 0 0  Injury with Fall? 0 0 0 0 0  Risk for fall due to : No Fall Risks  No Fall Risks No Fall Risks No Fall Risks  Follow up Falls prevention discussed  Falls prevention discussed Falls prevention discussed;Education provided Falls prevention discussed      Functional Status Survey: Is the patient deaf or have difficulty hearing?: Yes Does the patient have difficulty seeing, even when wearing glasses/contacts?: No Does the patient have difficulty concentrating, remembering, or making decisions?: No Does the patient have difficulty walking or climbing stairs?: No Does the patient have difficulty dressing or bathing?: No Does the patient have difficulty doing errands alone such as visiting a doctor's office or shopping?: No    Assessment & Plan  1. Need for immunization against influenza  - Flu Vaccine Trivalent High Dose (Fluad)  2. Acquired hypothyroidism  - levothyroxine (SYNTHROID) 25 MCG tablet; TAKE 1 TO 2 TABLETS BY MOUTH DAILY BEFORE BREAKFAST. ALTERNATE EVERY OTHER DAY  Dispense: 135 tablet; Refill: 1 - TSH  3. Gastro-esophageal reflux disease without esophagitis  - pantoprazole (PROTONIX) 40 MG tablet; TAKE 1 TABLET BY MOUTH EVERY DAY, IN PLACE OF OMEPRAZOLE  Dispense: 90 tablet; Refill: 1  4. History of TIA (transient ischemic attack)  - clopidogrel (  PLAVIX) 75 MG tablet; Take 1 tablet (75 mg total) by mouth daily.  Dispense: 90 tablet; Refill: 1  5. Atherosclerosis of aorta (HCC)  - atorvastatin (LIPITOR) 80 MG tablet; TAKE 1 TABLET(80 MG) BY MOUTH EVERY EVENING  Dispense: 90 tablet; Refill: 1 - Lipid panel - COMPLETE METABOLIC PANEL WITH GFR  6. Cerebral microvascular  disease  - atorvastatin (LIPITOR) 80 MG tablet; TAKE 1 TABLET(80 MG) BY MOUTH EVERY EVENING  Dispense: 90 tablet; Refill: 1  7. Dyslipidemia  -atorvastatin (LIPITOR) 80 MG tablet; TAKE 1 TABLET(80 MG) BY MOUTH EVERY EVENING  Dispense: 90 tablet; Refill: 1  8. Anemia, unspecified type  - Iron, Ferrous Sulfate, 325 (65 Fe) MG TABS; Take 325 mg by mouth every other day.  Dispense: 45 tablet; Refill: 1 - CBC with Differential/Platelet - Iron, TIBC and Ferritin Panel  9. Chronic cough  - DG Chest 2 View; Future

## 2022-12-24 ENCOUNTER — Ambulatory Visit
Admission: RE | Admit: 2022-12-24 | Discharge: 2022-12-24 | Disposition: A | Discharge status: Home / Self Care | Payer: Medicare Other | Attending: Family Medicine | Admitting: Family Medicine

## 2022-12-24 ENCOUNTER — Ambulatory Visit
Admission: RE | Admit: 2022-12-24 | Discharge: 2022-12-24 | Disposition: A | Discharge status: Home / Self Care | Payer: Medicare Other | Source: Ambulatory Visit | Attending: Family Medicine | Admitting: Family Medicine

## 2022-12-24 ENCOUNTER — Ambulatory Visit (INDEPENDENT_AMBULATORY_CARE_PROVIDER_SITE_OTHER): Payer: Medicare Other | Admitting: Family Medicine

## 2022-12-24 ENCOUNTER — Encounter: Payer: Self-pay | Admitting: Family Medicine

## 2022-12-24 VITALS — BP 112/72 | HR 81 | Resp 16 | Ht 61.0 in | Wt 104.0 lb

## 2022-12-24 DIAGNOSIS — E785 Hyperlipidemia, unspecified: Secondary | ICD-10-CM

## 2022-12-24 DIAGNOSIS — D649 Anemia, unspecified: Secondary | ICD-10-CM | POA: Diagnosis not present

## 2022-12-24 DIAGNOSIS — R053 Chronic cough: Secondary | ICD-10-CM | POA: Diagnosis not present

## 2022-12-24 DIAGNOSIS — E039 Hypothyroidism, unspecified: Secondary | ICD-10-CM

## 2022-12-24 DIAGNOSIS — K219 Gastro-esophageal reflux disease without esophagitis: Secondary | ICD-10-CM | POA: Diagnosis not present

## 2022-12-24 DIAGNOSIS — Z23 Encounter for immunization: Secondary | ICD-10-CM | POA: Diagnosis not present

## 2022-12-24 DIAGNOSIS — Z8673 Personal history of transient ischemic attack (TIA), and cerebral infarction without residual deficits: Secondary | ICD-10-CM

## 2022-12-24 DIAGNOSIS — I700 Atherosclerosis of aorta: Secondary | ICD-10-CM | POA: Diagnosis not present

## 2022-12-24 DIAGNOSIS — I6789 Other cerebrovascular disease: Secondary | ICD-10-CM

## 2022-12-24 MED ORDER — CLOPIDOGREL BISULFATE 75 MG PO TABS: 75.0000 mg | ORAL_TABLET | Freq: Every day | ORAL | 1 refills | Status: DC

## 2022-12-24 MED ORDER — PANTOPRAZOLE SODIUM 40 MG PO TBEC: DELAYED_RELEASE_TABLET | ORAL | 1 refills | Status: DC

## 2022-12-24 MED ORDER — LEVOTHYROXINE SODIUM 25 MCG PO TABS: ORAL_TABLET | ORAL | 1 refills | Status: DC

## 2022-12-24 MED ORDER — ATORVASTATIN CALCIUM 80 MG PO TABS: ORAL_TABLET | ORAL | 1 refills | Status: DC

## 2022-12-24 MED ORDER — IRON (FERROUS SULFATE) 325 (65 FE) MG PO TABS: 325.0000 mg | ORAL_TABLET | ORAL | 1 refills | Status: DC

## 2022-12-25 LAB — CBC WITH DIFFERENTIAL/PLATELET
Absolute Monocytes: 470 {cells}/uL (ref 200–950)
Basophils Absolute: 28 {cells}/uL (ref 0–200)
Basophils Relative: 0.5 %
Eosinophils Absolute: 62 {cells}/uL (ref 15–500)
Eosinophils Relative: 1.1 %
HCT: 37.4 % (ref 35.0–45.0)
Hemoglobin: 12.5 g/dL (ref 11.7–15.5)
Lymphs Abs: 1081 {cells}/uL (ref 850–3900)
MCH: 32.6 pg (ref 27.0–33.0)
MCHC: 33.4 g/dL (ref 32.0–36.0)
MCV: 97.4 fL (ref 80.0–100.0)
MPV: 10.4 fL (ref 7.5–12.5)
Monocytes Relative: 8.4 %
Neutro Abs: 3959 {cells}/uL (ref 1500–7800)
Neutrophils Relative %: 70.7 %
Platelets: 276 10*3/uL (ref 140–400)
RBC: 3.84 10*6/uL (ref 3.80–5.10)
RDW: 11.5 % (ref 11.0–15.0)
Total Lymphocyte: 19.3 %
WBC: 5.6 10*3/uL (ref 3.8–10.8)

## 2022-12-25 LAB — IRON,TIBC AND FERRITIN PANEL
%SAT: 33 % (ref 16–45)
Ferritin: 114 ng/mL (ref 16–288)
Iron: 105 ug/dL (ref 45–160)
TIBC: 323 ug/dL (ref 250–450)

## 2022-12-25 LAB — LIPID PANEL
Cholesterol: 170 mg/dL (ref <200)
HDL: 75 mg/dL (ref >=50)
LDL Cholesterol (Calc): 80 mg/dL
Non-HDL Cholesterol (Calc): 95 mg/dL (ref <130)
Total CHOL/HDL Ratio: 2.3 (calc) (ref <5.0)
Triglycerides: 70 mg/dL (ref <150)

## 2022-12-25 LAB — COMPLETE METABOLIC PANEL WITH GFR
AG Ratio: 1.7 (calc) (ref 1.0–2.5)
ALT: 13 U/L (ref 6–29)
AST: 22 U/L (ref 10–35)
Albumin: 4.3 g/dL (ref 3.6–5.1)
Alkaline phosphatase (APISO): 57 U/L (ref 37–153)
BUN: 13 mg/dL (ref 7–25)
CO2: 30 mmol/L (ref 20–32)
Calcium: 9.4 mg/dL (ref 8.6–10.4)
Chloride: 100 mmol/L (ref 98–110)
Creat: 0.75 mg/dL (ref 0.60–1.00)
Globulin: 2.5 g/dL (ref 1.9–3.7)
Glucose, Bld: 91 mg/dL (ref 65–99)
Potassium: 4.7 mmol/L (ref 3.5–5.3)
Sodium: 138 mmol/L (ref 135–146)
Total Bilirubin: 0.5 mg/dL (ref 0.2–1.2)
Total Protein: 6.8 g/dL (ref 6.1–8.1)
eGFR: 81 mL/min/{1.73_m2} (ref >=60)

## 2022-12-25 LAB — TSH: TSH: 1.9 m[IU]/L (ref 0.40–4.50)

## 2022-12-27 DIAGNOSIS — H04551 Acquired stenosis of right nasolacrimal duct: Secondary | ICD-10-CM | POA: Diagnosis not present

## 2023-01-06 DIAGNOSIS — H02132 Senile ectropion of right lower eyelid: Secondary | ICD-10-CM | POA: Diagnosis not present

## 2023-01-12 ENCOUNTER — Emergency Department
Admission: EM | Admit: 2023-01-12 | Discharge: 2023-01-12 | Disposition: A | Discharge status: Home / Self Care | Payer: Medicare Other | Attending: Emergency Medicine | Admitting: Emergency Medicine

## 2023-01-12 ENCOUNTER — Emergency Department: Payer: Medicare Other

## 2023-01-12 DIAGNOSIS — R9431 Abnormal electrocardiogram [ECG] [EKG]: Secondary | ICD-10-CM | POA: Diagnosis not present

## 2023-01-12 DIAGNOSIS — H9209 Otalgia, unspecified ear: Secondary | ICD-10-CM | POA: Diagnosis not present

## 2023-01-12 DIAGNOSIS — Z8673 Personal history of transient ischemic attack (TIA), and cerebral infarction without residual deficits: Secondary | ICD-10-CM | POA: Insufficient documentation

## 2023-01-12 DIAGNOSIS — R519 Headache, unspecified: Secondary | ICD-10-CM | POA: Diagnosis not present

## 2023-01-12 DIAGNOSIS — R202 Paresthesia of skin: Secondary | ICD-10-CM | POA: Diagnosis not present

## 2023-01-12 DIAGNOSIS — Z7902 Long term (current) use of antithrombotics/antiplatelets: Secondary | ICD-10-CM | POA: Diagnosis not present

## 2023-01-12 DIAGNOSIS — R200 Anesthesia of skin: Secondary | ICD-10-CM | POA: Diagnosis not present

## 2023-01-12 DIAGNOSIS — E039 Hypothyroidism, unspecified: Secondary | ICD-10-CM | POA: Diagnosis not present

## 2023-01-12 LAB — DIFFERENTIAL
Abs Immature Granulocytes: 0.01 10*3/uL (ref 0.00–0.07)
Basophils Absolute: 0 10*3/uL (ref 0.0–0.1)
Basophils Relative: 0 %
Eosinophils Absolute: 0.1 10*3/uL (ref 0.0–0.5)
Eosinophils Relative: 2 %
Immature Granulocytes: 0 %
Lymphocytes Relative: 27 %
Lymphs Abs: 1 10*3/uL (ref 0.7–4.0)
Monocytes Absolute: 0.3 10*3/uL (ref 0.1–1.0)
Monocytes Relative: 8 %
Neutro Abs: 2.4 10*3/uL (ref 1.7–7.7)
Neutrophils Relative %: 63 %

## 2023-01-12 LAB — COMPREHENSIVE METABOLIC PANEL
ALT: 17 U/L (ref 0–44)
AST: 25 U/L (ref 15–41)
Albumin: 4.1 g/dL (ref 3.5–5.0)
Alkaline Phosphatase: 42 U/L (ref 38–126)
Anion gap: 7 (ref 5–15)
BUN: 11 mg/dL (ref 8–23)
CO2: 29 mmol/L (ref 22–32)
Calcium: 9.2 mg/dL (ref 8.9–10.3)
Chloride: 98 mmol/L (ref 98–111)
Creatinine, Ser: 0.75 mg/dL (ref 0.44–1.00)
GFR, Estimated: >60 mL/min (ref >60)
Glucose, Bld: 95 mg/dL (ref 70–99)
Potassium: 3.9 mmol/L (ref 3.5–5.1)
Sodium: 134 mmol/L — ABNORMAL LOW (ref 135–145)
Total Bilirubin: 0.8 mg/dL (ref 0.3–1.2)
Total Protein: 6.4 g/dL — ABNORMAL LOW (ref 6.5–8.1)

## 2023-01-12 LAB — APTT: aPTT: 26 s (ref 24–36)

## 2023-01-12 LAB — CBC
HCT: 34.6 % — ABNORMAL LOW (ref 36.0–46.0)
Hemoglobin: 11.8 g/dL — ABNORMAL LOW (ref 12.0–15.0)
MCH: 32.5 pg (ref 26.0–34.0)
MCHC: 34.1 g/dL (ref 30.0–36.0)
MCV: 95.3 fL (ref 80.0–100.0)
Platelets: 224 K/uL (ref 150–400)
RBC: 3.63 MIL/uL — ABNORMAL LOW (ref 3.87–5.11)
RDW: 12.4 % (ref 11.5–15.5)
WBC: 3.8 K/uL — ABNORMAL LOW (ref 4.0–10.5)
nRBC: 0 % (ref 0.0–0.2)

## 2023-01-12 LAB — URINALYSIS, ROUTINE W REFLEX MICROSCOPIC
Bilirubin Urine: NEGATIVE
Glucose, UA: NEGATIVE mg/dL
Hgb urine dipstick: NEGATIVE
Ketones, ur: NEGATIVE mg/dL
Leukocytes,Ua: NEGATIVE
Nitrite: NEGATIVE
Protein, ur: NEGATIVE mg/dL
Specific Gravity, Urine: 1.005 (ref 1.005–1.030)
pH: 9 — ABNORMAL HIGH (ref 5.0–8.0)

## 2023-01-12 LAB — PROTIME-INR
INR: 0.9 (ref 0.8–1.2)
Prothrombin Time: 12.8 s (ref 11.4–15.2)

## 2023-01-12 LAB — CBG MONITORING, ED: Glucose-Capillary: 98 mg/dL (ref 70–99)

## 2023-01-12 LAB — SEDIMENTATION RATE: Sed Rate: 6 mm/h (ref 0–30)

## 2023-01-12 LAB — C-REACTIVE PROTEIN: CRP: 0.5 mg/dL (ref <1.0)

## 2023-01-12 NOTE — ED Notes (Signed)
Pt back from CT. EKG obtained. EDP at bedside

## 2023-01-12 NOTE — Consult Note (Signed)
NEUROLOGY CONSULT NOTE   Date of service: January 12, 2023 Patient Name: Heather Singh MRN:  [id number] DOB:  11-24-1945 Chief Complaint: "Right facial numbness" Requesting Provider: Chesley Noon, MD  History of Present Illness  TAMICKA KISPERT is a 77 y.o. female  has a past medical history of Allergy, Back pain, Basal cell carcinoma (~2000), Dyslipidemia, Dysplastic nevus (01/17/2009), Fibrocystic breast disease, GERD (gastroesophageal reflux disease), Hyperlipidemia, Menopause, Osteoporosis, Reflux esophagitis, RLS (restless legs syndrome), and Urinary dysfunction.  Prior history of a similar TIAs with right facial numbness who presents with complaints of right-sided facial numbness with last known well somewhere around 7:30 AM.  Woke up this morning at 7 AM normal but then started having some numbness only on the right side of the face along with shooting pain versus discomfort on the right temple and right jaw.  Symptoms did not resolve within a few minutes so she decided to come to the hospital to get checked out.  She was seen at triage, and due to sudden onset of focal neurological symptoms, code stroke was activated. She was seen in the CT scanner.  Only deficit was subjective sensation change on the right face in comparison to the normal sensation on the left face.  Otherwise unremarkable neurological exam. Currently on aspirin and Plavix.  Initial plan was aspirin and Plavix for 3 weeks but due to recurrent TIAs, outpatient neurologist has continued her on DAPT.  She denied any chest pain or shortness of breath.  Denies any history of cardiac disease.  Denies fevers chills or preceding sickness or illness.  Denies any change in hearing or taste.  Denies any shoulder pain or bodyaches  LKW: 7:30 AM Modified rankin score: 0-Completely asymptomatic and back to baseline post- stroke IV Thrombolysis: Likely not stroke versus a stroke with very minimal symptoms hence no TNK EVT: Exam not  consistent with LVO   ROS  Comprehensive ROS performed and pertinent positives documented in HPI  Past History   Past Medical History:  Diagnosis Date   Allergy    Back pain    Basal cell carcinoma ~2000   L brow, treated with MOHs   Dyslipidemia    Dysplastic nevus 01/17/2009   right thigh, slight atypia    Fibrocystic breast disease    GERD (gastroesophageal reflux disease)    Hyperlipidemia    Menopause    Osteoporosis    Reflux esophagitis    RLS (restless legs syndrome)    Urinary dysfunction     Past Surgical History:  Procedure Laterality Date   BREAST CYST ASPIRATION Left    BREAST EXCISIONAL BIOPSY Left 1980's   neg   CATARACT EXTRACTION     EYE SURGERY     left   KYPHOPLASTY N/A 08/19/2016   Procedure: KYPHOPLASTY L3,L4;  Surgeon: Kennedy Bucker, MD;  Location: [facility] OR[facility];  Service: Orthopedics;  Laterality: N/A;   KYPHOPLASTY N/A 09/21/2016   Procedure: [id number];  Surgeon: Kennedy Bucker, MD;  Location: [facility] OR[facility];  Service: Orthopedics;  Laterality: N/A;   MOUTH SURGERY     POLYPECTOMY     SLING BLADE PROCEDURE     TOTAL ABDOMINAL HYSTERECTOMY      Family History: Family History  Problem Relation Age of Onset   Transient ischemic attack Mother    Atrial fibrillation Mother    Arthritis Mother        OA   Heart disease Father        CHF   CAD Father  Hyperlipidemia Sister    Arthritis Sister        OA   Stroke Sister        Lived in a nursing home and died from COVID-19   Arthritis Sister        OA   Drug abuse Son    Hypertension Son    Liver disease Son    Alcoholism Son    Breast cancer Maternal Aunt    Breast cancer Paternal Aunt    Heart failure Other    Coronary artery disease Other    Stroke Other    Hyperlipidemia Other    Hypertension Other    Thyroid disease Other     Social History  reports that she has never smoked. She has never used smokeless tobacco. She reports that she does not drink alcohol and does not use  drugs.  Allergies  Allergen Reactions   Alendronate     Dyspepsia, inflamed esophagus    Levofloxacin Hives   Nitrofurantoin Monohyd Macro Hives   Sulfamethoxazole-Trimethoprim Hives   Etodolac Hives   Penicillins Hives and Rash    Has patient had a PCN reaction causing immediate rash, facial/tongue/throat swelling, SOB or lightheadedness with hypotension: No Has patient had a PCN reaction causing severe rash involving mucus membranes or skin necrosis: No Has patient had a PCN reaction that required hospitalization: No Has patient had a PCN reaction occurring within the last 10 years: No If all of the above answers are "NO", then may proceed with Cephalosporin use.     Medications  No current facility-administered medications for this encounter.  Current Outpatient Medications:    aspirin 81 MG EC tablet, Take 1 tablet (81 mg total) by mouth daily. Swallow whole., Disp: 30 tablet, Rfl: 12   atorvastatin (LIPITOR) 80 MG tablet, TAKE 1 TABLET(80 MG) BY MOUTH EVERY EVENING, Disp: 90 tablet, Rfl: 1   calcium carbonate (TUMS EX) 750 MG chewable tablet, Chew 1 tablet by mouth daily., Disp: , Rfl:    Cholecalciferol (VITAMIN D) 2000 units CAPS, Take 1 capsule by mouth daily., Disp: , Rfl:    clopidogrel (PLAVIX) 75 MG tablet, Take 1 tablet (75 mg total) by mouth daily., Disp: 90 tablet, Rfl: 1   denosumab (PROLIA) 60 MG/ML SOSY injection, Inject 60 mg into the skin every 6 (six) months., Disp: , Rfl:    fexofenadine (ALLEGRA ALLERGY) 180 MG tablet, Take 1 tablet (180 mg total) by mouth daily., Disp: 90 tablet, Rfl: 0   Iron, Ferrous Sulfate, 325 (65 Fe) MG TABS, Take 325 mg by mouth every other day., Disp: 45 tablet, Rfl: 1   levothyroxine (SYNTHROID) 25 MCG tablet, TAKE 1 TO 2 TABLETS BY MOUTH DAILY BEFORE BREAKFAST. ALTERNATE EVERY OTHER DAY, Disp: 135 tablet, Rfl: 1   Magnesium 400 MG TABS, Take 1 tablet by mouth daily., Disp: , Rfl:    pantoprazole (PROTONIX) 40 MG tablet, TAKE 1 TABLET  BY MOUTH EVERY DAY, IN PLACE OF OMEPRAZOLE, Disp: 90 tablet, Rfl: 1   vitamin B-12 (CYANOCOBALAMIN) 1000 MCG tablet, Take 1,000 mcg by mouth 3 (three) times a week., Disp: , Rfl:   Vitals  There were no vitals filed for this visit.  There is no height or weight on file to calculate BMI.  Physical Exam  General: Well-developed well-nourished in no acute distress HEENT: Normocephalic atraumatic Lungs: Clear Cardiovascular: Regular rate rhythm Abdomen nondistended nontender Neurological exam Awake alert oriented x 3 No dysarthria No aphasia Cranial nerve examination: Pupils are equal  round react light, extraocular movements are intact, visual fields are full, facial sensation diminished on the right in comparison to the left, no scalp tenderness, palpable temporal pulses, facial symmetry is preserved at rest and on smiling, auditory acuity is intact, tongue and palate midline. Motor examination with no drift in any of the 4 extremities Sensation intact to light touch elsewhere Coordination with no dysmetria NIH stroke scale-1  Labs   CBC:  Recent Labs  Lab 01/12/23 0845  WBC 3.8*  NEUTROABS 2.4  HGB 11.8*  HCT 34.6*  MCV 95.3  PLT 224    Basic Metabolic Panel:  Lab Results  Component Value Date   NA 138 12/24/2022   K 4.7 12/24/2022   CO2 30 12/24/2022   GLUCOSE 91 12/24/2022   BUN 13 12/24/2022   CREATININE 0.75 12/24/2022   CALCIUM 9.4 12/24/2022   GFRNONAA >60 11/03/2021   GFRAA 96 05/05/2020   Lipid Panel:  Lab Results  Component Value Date   LDLCALC 80 12/24/2022   HgbA1c:  Lab Results  Component Value Date   HGBA1C 5.4 10/24/2021    CT Head without contrast(Personally reviewed): Aspects 10.  No bleed.  No evolving stroke  CT angio Head and Neck with contrast Not done-exam not consistent with Lvo Prior CT angiogram head and neck August 2023 with no emergent LVO.  Mild changes in the cervical ICAs with concern for FMD.  No significant intracranial  atherosclerotic disease.   MRI Brain(Personally reviewed): Personal review of DWI images negative for acute stroke.  Full study pending   Impression   ANYELIN HORWITZ is a 77 y.o. female with above past medical history including that of TIAs that have been very similar with a right sided temporal pain and numbness, similar episode happened today which brought her to the hospital.  On my examination, it was hard for her to describe the sensation that she describes this as shooting pain followed by numbness on the right temple and jaw.  Denies any jaw claudication or visual symptoms for me to be very worried about GCA but given her age, will check ESR. I do not think this is a true TIA given that it has always happened stereotypically the same way without any evidence of prior imaging showing a culprit vessel which might have explained same symptoms but in the absence of abnormal cerebral vasculature, the symptoms are likely not TIAs. Does not fit clear history or pattern for trigeminal autonomic cephalgia.  Impression: Right facial paresthesias-less likely TIA, evaluate for GCA.  Recommendations  From the perspective of stroke risk factor optimization, I would for now continue dual antiplatelets as has been recommended by the outpatient neurologist but would recommend considering discontinuing 1 antiplatelet and keeping on single antiplatelet agent since her previous head and neck imaging did not reveal significant iCAD.  From the perspective of current symptoms of right facial numbness/shooting pains on the right temple and jaw, I will check ESR and CRP.  If they are significantly elevated, may consider temporal artery biopsy although she does not have classical history of symptoms for giant cell arteritis.  Will also consider high-dose steroids at that time  Plan was discussed with Dr. Larinda Buttery   [facility] ESR 6 - not likely to be GCA. CRP pending  OK to dc home and follow with outpatient  neurology  -- Milon Dikes, MD Neurologist [facility]

## 2023-01-12 NOTE — ED Provider Notes (Signed)
[facility] Provider Note    Event Date/Time   First MD Initiated Contact with Patient 01/12/23 [phone]     (approximate)   History   Chief Complaint Code Stroke (R face numbness since 0730 )   HPI  Heather Singh is a 77 y.o. female with past medical history of hyperlipidemia, hypothyroidism, and TIA who presents to the ED complaining of facial numbness.  Patient reports that she woke up this morning feeling fine, but around 730 had acute onset of numbness affecting the right side of her face.  She has not noticed any drooping on that side of her face and denies any vision changes or speech changes.  She denies any numbness or weakness in her extremities, does state that she has had a TIA in the past with numbness on the right side.  She also states that for the past couple of days she has been dealing with intermittent shooting pain over the right side of her face, has not been evaluated for this.  She has not noticed any redness or swelling over her face.     Physical Exam   Triage Vital Signs: ED Triage Vitals  Encounter Vitals Group     BP      Systolic BP Percentile      Diastolic BP Percentile      Pulse      Resp      Temp      Temp src      SpO2      Weight      Height      Head Circumference      Peak Flow      Pain Score      Pain Loc      Pain Education      Exclude from Growth Chart     Most recent vital signs: Vitals:   01/12/23 0922 01/12/23 0930  BP: (!) 160/81 136/72  Pulse: 68 65  Resp: 13 12  Temp: 98.2 F (36.8 C)   SpO2: 97% 100%    Constitutional: Alert and oriented. Eyes: Conjunctivae are normal. Head: Atraumatic.  No temporal tenderness noted bilaterally. Nose: No congestion/rhinnorhea. Mouth/Throat: Mucous membranes are moist.  Cardiovascular: Normal rate, regular rhythm. Grossly normal heart sounds.  2+ radial pulses bilaterally. Respiratory: Normal respiratory effort.  No retractions. Lungs  CTAB. Gastrointestinal: Soft and nontender. No distention. Musculoskeletal: No lower extremity tenderness nor edema.  Neurologic:  Normal speech and language. No gross focal neurologic deficits are appreciated.    ED Results / Procedures / Treatments   Labs (all labs ordered are listed, but only abnormal results are displayed) Labs Reviewed  CBC - Abnormal; Notable for the following components:      Result Value   WBC 3.8 (*)    RBC 3.63 (*)    Hemoglobin 11.8 (*)    HCT 34.6 (*)    All other components within normal limits  COMPREHENSIVE METABOLIC PANEL - Abnormal; Notable for the following components:   Sodium 134 (*)    Total Protein 6.4 (*)    All other components within normal limits  URINALYSIS, ROUTINE W REFLEX MICROSCOPIC - Abnormal; Notable for the following components:   Color, Urine STRAW (*)    APPearance CLEAR (*)    pH 9.0 (*)    All other components within normal limits  PROTIME-INR  APTT  DIFFERENTIAL  SEDIMENTATION RATE  C-REACTIVE PROTEIN  CBG MONITORING, ED     EKG  ED ECG REPORT I, Chesley Noon, the attending physician, personally viewed and interpreted this ECG.   Date: 01/12/2023  EKG Time: 9:25  Rate: 66  Rhythm: normal sinus rhythm  Axis: Normal  Intervals:none  ST&T Change: None  RADIOLOGY CT head reviewed and interpreted by me with no hemorrhage or midline shift.  PROCEDURES:  Critical Care performed: No  Procedures   MEDICATIONS ORDERED IN ED: Medications - No data to display   IMPRESSION / MDM / ASSESSMENT AND PLAN / ED COURSE  I reviewed the triage vital signs and the nursing notes.                              77 y.o. female with past medical history of hyperlipidemia, hypothyroidism, and TIA who presents to the ED with acute onset right-sided facial numbness about an hour prior to arrival.  Patient's presentation is most consistent with acute presentation with potential threat to life or bodily  function.  Differential diagnosis includes, but is not limited to, stroke, TIA, trigeminal neuralgia, complicated migraine, anemia, electrolyte abnormality, AKI.  Patient nontoxic-appearing and in no acute distress, vital signs are unremarkable.  She presents with acute onset right-sided facial numbness for the past hour and code stroke was activated on arrival.  CT head is negative for acute process, patient evaluated by Dr. Wilford Corner of neurology and symptoms seem to be resolving.  MRI was performed and negative for acute stroke per Dr. Wilford Corner, does show new hygromas that are unlikely to be contributing to her symptoms.  Labs today show no significant anemia, leukocytosis, tract abnormality, or AKI.  LFTs are also unremarkable.  Per neurology, patient will need further assessment for temporal arteritis with ESR and CRP, but if these are unremarkable she would be appropriate for close outpatient follow-up with her neurologist given low suspicion for TIA.  ESR within normal limits, CRP still pending at this time but patient appropriate for discharge home given low suspicion for temporal arteritis.  She was counseled to follow-up with her neurologist and to return to the ED for new or worsening symptoms, patient agrees with plan.      FINAL CLINICAL IMPRESSION(S) / ED DIAGNOSES   Final diagnoses:  Right facial numbness     Rx / DC Orders   ED Discharge Orders     None        Note:  This document was prepared using Dragon voice recognition software and may include unintentional dictation errors.   Chesley Noon, MD 01/12/23 [phone]

## 2023-01-12 NOTE — ED Notes (Signed)
Pt CBG 98 taken in triage. BP 166/75 taken in triage.

## 2023-01-12 NOTE — Code Documentation (Signed)
Stroke Response Nurse Documentation Code Documentation  Heather Singh is a 77 y.o. female arriving to [facility] via [facility] on 01/12/2023 with past medical hx of dyslipidemia, basal cell carcinoma, RLS, GERD, dysplastic nevus, fibrocystic breast disease, TIAs. On aspirin 81 mg daily and clopidogrel 75 mg daily. Code stroke was activated by ED.   Patient from home where she was LKW at 0730 and now complaining of right facial numbness. Patient explains that she woke up normal around 0700. At around 0730, she began to have acute onset right facial numbness.  Stroke team at the bedside on patient arrival. Labs drawn and patient cleared for CT by Dr. Larinda Buttery. Patient to CT with team. NIHSS 1, see documentation for details and code stroke times. Patient with right decreased sensation on exam. The following imaging was completed:  CT Head and MRI. Patient is not a candidate for IV Thrombolytic due to stroke not suspected, per MD. Patient is not a candidate for IR due to exam not consistent with LVO, per MD.   Care Plan: MRI negative for acute stroke, Code stroke cancelled.  Bedside handoff with ED RN Iline Oven and Wandalee Ferdinand  Stroke Response RN

## 2023-01-12 NOTE — ED Notes (Signed)
First nurse note:  Pt states has been having shooting pain to R head, ear and face and then this morning around 0730 began feeling slightly numb to same R side of face, starting at 0730. Smile is symmetrical. Pt has clear speech. Hx TIA 3 times. Pt has no arm drift. Dr Larinda Buttery called to come examine pt. Activating code stroke.

## 2023-01-12 NOTE — Progress Notes (Signed)
CODE STROKE- PHARMACY COMMUNICATION  Time CODE STROKE called/page received: [phone]  Time response to CODE STROKE was made (in person or via phone): [phone]  Time Stroke Kit retrieved from [employer] (only if needed): TNK not given   Name of Provider/Nurse contacted: N/A  Past Medical History:  Diagnosis Date   Allergy    Back pain    Basal cell carcinoma ~2000   L brow, treated with MOHs   Dyslipidemia    Dysplastic nevus 01/17/2009   right thigh, slight atypia    Fibrocystic breast disease    GERD (gastroesophageal reflux disease)    Hyperlipidemia    Menopause    Osteoporosis    Reflux esophagitis    RLS (restless legs syndrome)    Urinary dysfunction    Prior to Admission medications   Medication Sig Start Date End Date Taking? Authorizing Provider  aspirin 81 MG EC tablet Take 1 tablet (81 mg total) by mouth daily. Swallow whole. 04/13/21   Alba Cory, MD  atorvastatin (LIPITOR) 80 MG tablet TAKE 1 TABLET(80 MG) BY MOUTH EVERY EVENING 12/24/22   Carlynn Purl, Danna Hefty, MD  calcium carbonate (TUMS EX) 750 MG chewable tablet Chew 1 tablet by mouth daily.    [provider]  Cholecalciferol (VITAMIN D) 2000 units CAPS Take 1 capsule by mouth daily.    [provider]  clopidogrel (PLAVIX) 75 MG tablet Take 1 tablet (75 mg total) by mouth daily. 12/24/22   Alba Cory, MD  denosumab (PROLIA) 60 MG/ML SOSY injection Inject 60 mg into the skin every 6 (six) months.    [provider]  fexofenadine (ALLEGRA ALLERGY) 180 MG tablet Take 1 tablet (180 mg total) by mouth daily. 05/05/20   Alba Cory, MD  Iron, Ferrous Sulfate, 325 (65 Fe) MG TABS Take 325 mg by mouth every other day. 12/24/22   Alba Cory, MD  levothyroxine (SYNTHROID) 25 MCG tablet TAKE 1 TO 2 TABLETS BY MOUTH DAILY BEFORE BREAKFAST. ALTERNATE EVERY OTHER DAY 12/24/22   Alba Cory, MD  Magnesium 400 MG TABS Take 1 tablet by mouth daily.    [provider]  pantoprazole  (PROTONIX) 40 MG tablet TAKE 1 TABLET BY MOUTH EVERY DAY, IN PLACE OF OMEPRAZOLE 12/24/22   Sowles, Danna Hefty, MD  vitamin B-12 (CYANOCOBALAMIN) 1000 MCG tablet Take 1,000 mcg by mouth 3 (three) times a week.    [provider]   Littie Deeds, PharmD Pharmacy Resident  01/12/2023 8:56 AM

## 2023-01-12 NOTE — ED Notes (Signed)
Pt to stat MRI.

## 2023-01-12 NOTE — ED Notes (Signed)
Pt ambulatory to toilet at this time 

## 2023-01-12 NOTE — ED Triage Notes (Signed)
First nurse note:  Pt states has been having shooting pain to R head, ear and face and then this morning around 0730 began feeling slightly numb to same R side of face, starting at 0730. Smile is symmetrical. Pt has clear speech. Hx TIA 3 times. Pt has no arm drift. Dr Larinda Buttery called to come examine pt. Activating code stroke.

## 2023-01-12 NOTE — ED Notes (Signed)
Pt given education on returning to ED with any new or worsening symptoms. Pt educated on following up with neurology.

## 2023-01-12 NOTE — ED Notes (Signed)
CODE STROKE CALLED TO CARELINK at 8:37

## 2023-01-12 NOTE — Progress Notes (Signed)
Telestroke RN Code Stroke Note  0840- stroke cart activation by stroke team in CT.  [phone]- Dr Wilford Corner at bedside in CT 0850- per Dr Wilford Corner pt will not be TNK candidate d/t low suspicion of stroke. He will order MRI.   MRS 0 NIH 1 LKW 0730

## 2023-01-12 NOTE — Progress Notes (Signed)
   01/12/23 0900  Spiritual Encounters  Type of Visit Initial  Care provided to: Family  Referral source Code page  Reason for visit Code  OnCall Visit Yes  Interventions  Spiritual Care Interventions Made Compassionate presence;Prayer;Encouragement  Intervention Outcomes  Outcomes Awareness of support   Chaplain responded to Code Stroke. Patient at CT and then to MRI. Chaplain remained with husband to provide support and encouragement through presence and prayer.

## 2023-01-13 ENCOUNTER — Encounter: Payer: Self-pay | Admitting: Family Medicine

## 2023-01-17 DIAGNOSIS — Z8673 Personal history of transient ischemic attack (TIA), and cerebral infarction without residual deficits: Secondary | ICD-10-CM | POA: Diagnosis not present

## 2023-01-17 DIAGNOSIS — R519 Headache, unspecified: Secondary | ICD-10-CM | POA: Diagnosis not present

## 2023-01-17 DIAGNOSIS — R200 Anesthesia of skin: Secondary | ICD-10-CM | POA: Diagnosis not present

## 2023-01-17 DIAGNOSIS — R5383 Other fatigue: Secondary | ICD-10-CM | POA: Diagnosis not present

## 2023-01-24 DIAGNOSIS — H2702 Aphakia, left eye: Secondary | ICD-10-CM | POA: Diagnosis not present

## 2023-01-24 DIAGNOSIS — H40003 Preglaucoma, unspecified, bilateral: Secondary | ICD-10-CM | POA: Diagnosis not present

## 2023-01-24 DIAGNOSIS — D3131 Benign neoplasm of right choroid: Secondary | ICD-10-CM | POA: Diagnosis not present

## 2023-01-31 DIAGNOSIS — M810 Age-related osteoporosis without current pathological fracture: Secondary | ICD-10-CM | POA: Diagnosis not present

## 2023-02-24 DIAGNOSIS — H6123 Impacted cerumen, bilateral: Secondary | ICD-10-CM | POA: Diagnosis not present

## 2023-02-24 DIAGNOSIS — J3489 Other specified disorders of nose and nasal sinuses: Secondary | ICD-10-CM | POA: Diagnosis not present

## 2023-03-22 ENCOUNTER — Other Ambulatory Visit: Payer: Self-pay | Admitting: Family Medicine

## 2023-03-22 DIAGNOSIS — Z1231 Encounter for screening mammogram for malignant neoplasm of breast: Secondary | ICD-10-CM

## 2023-03-31 ENCOUNTER — Ambulatory Visit
Admission: RE | Admit: 2023-03-31 | Discharge: 2023-03-31 | Disposition: A | Discharge status: Home / Self Care | Payer: Medicare Other | Source: Ambulatory Visit | Attending: Family Medicine | Admitting: Family Medicine

## 2023-03-31 DIAGNOSIS — Z1231 Encounter for screening mammogram for malignant neoplasm of breast: Secondary | ICD-10-CM | POA: Insufficient documentation

## 2023-04-07 DIAGNOSIS — H02122 Mechanical ectropion of right lower eyelid: Secondary | ICD-10-CM | POA: Diagnosis not present

## 2023-04-07 DIAGNOSIS — H04563 Stenosis of bilateral lacrimal punctum: Secondary | ICD-10-CM | POA: Diagnosis not present

## 2023-04-07 DIAGNOSIS — H02103 Unspecified ectropion of right eye, unspecified eyelid: Secondary | ICD-10-CM | POA: Diagnosis not present

## 2023-04-07 DIAGNOSIS — H02125 Mechanical ectropion of left lower eyelid: Secondary | ICD-10-CM | POA: Diagnosis not present

## 2023-04-12 DIAGNOSIS — Z9889 Other specified postprocedural states: Secondary | ICD-10-CM | POA: Diagnosis not present

## 2023-04-12 DIAGNOSIS — S0501XA Injury of conjunctiva and corneal abrasion without foreign body, right eye, initial encounter: Secondary | ICD-10-CM | POA: Diagnosis not present

## 2023-04-12 DIAGNOSIS — S0502XA Injury of conjunctiva and corneal abrasion without foreign body, left eye, initial encounter: Secondary | ICD-10-CM | POA: Diagnosis not present

## 2023-04-15 DIAGNOSIS — S0501XA Injury of conjunctiva and corneal abrasion without foreign body, right eye, initial encounter: Secondary | ICD-10-CM | POA: Diagnosis not present

## 2023-04-15 DIAGNOSIS — Z9889 Other specified postprocedural states: Secondary | ICD-10-CM | POA: Diagnosis not present

## 2023-04-15 DIAGNOSIS — S0502XA Injury of conjunctiva and corneal abrasion without foreign body, left eye, initial encounter: Secondary | ICD-10-CM | POA: Diagnosis not present

## 2023-05-15 ENCOUNTER — Other Ambulatory Visit: Payer: Self-pay | Admitting: Family Medicine

## 2023-05-15 DIAGNOSIS — E039 Hypothyroidism, unspecified: Secondary | ICD-10-CM

## 2023-05-17 ENCOUNTER — Encounter: Payer: Self-pay | Admitting: Dermatology

## 2023-05-17 ENCOUNTER — Ambulatory Visit (INDEPENDENT_AMBULATORY_CARE_PROVIDER_SITE_OTHER): Payer: Medicare Other | Admitting: Dermatology

## 2023-05-17 DIAGNOSIS — L814 Other melanin hyperpigmentation: Secondary | ICD-10-CM | POA: Diagnosis not present

## 2023-05-17 DIAGNOSIS — Z86018 Personal history of other benign neoplasm: Secondary | ICD-10-CM

## 2023-05-17 DIAGNOSIS — D1801 Hemangioma of skin and subcutaneous tissue: Secondary | ICD-10-CM

## 2023-05-17 DIAGNOSIS — L820 Inflamed seborrheic keratosis: Secondary | ICD-10-CM

## 2023-05-17 DIAGNOSIS — Z85828 Personal history of other malignant neoplasm of skin: Secondary | ICD-10-CM

## 2023-05-17 DIAGNOSIS — W908XXA Exposure to other nonionizing radiation, initial encounter: Secondary | ICD-10-CM

## 2023-05-17 DIAGNOSIS — Z1283 Encounter for screening for malignant neoplasm of skin: Secondary | ICD-10-CM | POA: Diagnosis not present

## 2023-05-17 DIAGNOSIS — D229 Melanocytic nevi, unspecified: Secondary | ICD-10-CM

## 2023-05-17 DIAGNOSIS — L720 Epidermal cyst: Secondary | ICD-10-CM | POA: Diagnosis not present

## 2023-05-17 DIAGNOSIS — L821 Other seborrheic keratosis: Secondary | ICD-10-CM

## 2023-05-17 DIAGNOSIS — L578 Other skin changes due to chronic exposure to nonionizing radiation: Secondary | ICD-10-CM | POA: Diagnosis not present

## 2023-05-17 NOTE — Patient Instructions (Addendum)
 Apply Effaclar to affected area on face at right side of nose at bedtime, wash off in morning. Samples given today.    Cryotherapy Aftercare  Wash gently with soap and water everyday.   Apply Vaseline Jelly daily until healed.     Recommend OTC Gold Bond Rapid Relief Anti-Itch cream (pramoxine + menthol), CeraVe Anti-itch cream or lotion (pramoxine), Sarna lotion (Original- menthol + camphor or Sensitive- pramoxine) or Eucerin 12 hour Itch Relief lotion (menthol) up to 3 times per day to areas on body that are itchy.     Recommend daily broad spectrum sunscreen SPF 30+ to sun-exposed areas, reapply every 2 hours as needed. Call for new or changing lesions.  Staying in the shade or wearing long sleeves, sun glasses (UVA+UVB protection) and wide brim hats (4-inch brim around the entire circumference of the hat) are also recommended for sun protection.     Melanoma ABCDEs  Melanoma is the most dangerous type of skin cancer, and is the leading cause of death from skin disease.  You are more likely to develop melanoma if you: Have light-colored skin, light-colored eyes, or red or blond hair Spend a lot of time in the sun Tan regularly, either outdoors or in a tanning bed Have had blistering sunburns, especially during childhood Have a close family member who has had a melanoma Have atypical moles or large birthmarks  Early detection of melanoma is key since treatment is typically straightforward and cure rates are extremely high if we catch it early.   The first sign of melanoma is often a change in a mole or a new dark spot.  The ABCDE system is a way of remembering the signs of melanoma.  A for asymmetry:  The two halves do not match. B for border:  The edges of the growth are irregular. C for color:  A mixture of colors are present instead of an even brown color. D for diameter:  Melanomas are usually (but not always) greater than 6mm - the size of a pencil eraser. E for  evolution:  The spot keeps changing in size, shape, and color.  Please check your skin once per month between visits. You can use a small mirror in front and a large mirror behind you to keep an eye on the back side or your body.   If you see any new or changing lesions before your next follow-up, please call to schedule a visit.  Please continue daily skin protection including broad spectrum sunscreen SPF 30+ to sun-exposed areas, reapplying every 2 hours as needed when you're outdoors.   Staying in the shade or wearing long sleeves, sun glasses (UVA+UVB protection) and wide brim hats (4-inch brim around the entire circumference of the hat) are also recommended for sun protection.      Due to recent changes in healthcare laws, you may see results of your pathology and/or laboratory studies on MyChart before the doctors have had a chance to review them. We understand that in some cases there may be results that are confusing or concerning to you. Please understand that not all results are received at the same time and often the doctors may need to interpret multiple results in order to provide you with the best plan of care or course of treatment. Therefore, we ask that you please give [place] 2 business days to thoroughly review all your results before contacting the office for clarification. Should we see a critical lab result, you will be contacted sooner.  If You Need Anything After Your Visit  If you have any questions or concerns for your doctor, please call our main line at [phone] and press option 4 to reach your doctor's medical assistant. If no one answers, please leave a voicemail as directed and we will return your call as soon as possible. Messages left after 4 pm will be answered the following business day.   You may also send [place] a message via MyChart. We typically respond to MyChart messages within 1-2 business days.  For prescription refills, please ask your pharmacy to contact  our office. Our fax number is [phone].  If you have an urgent issue when the clinic is closed that cannot wait until the next business day, you can page your doctor at the number below.    Please note that while we do our best to be available for urgent issues outside of office hours, we are not available 24/7.   If you have an urgent issue and are unable to reach [place], you may choose to seek medical care at your doctor's office, retail clinic, urgent care center, or emergency room.  If you have a medical emergency, please immediately call 911 or go to the emergency department.  Pager Numbers  - Dr. Gwen Pounds: [phone]  - Dr. Roseanne Reno: [phone]  - Dr. Katrinka Blazing: [phone]   In the event of inclement weather, please call our main line at [phone] for an update on the status of any delays or closures.  Dermatology Medication Tips: Please keep the boxes that topical medications come in in order to help keep track of the instructions about where and how to use these. Pharmacies typically print the medication instructions only on the boxes and not directly on the medication tubes.   If your medication is too expensive, please contact our office at [phone] option 4 or send [place] a message through MyChart.   We are unable to tell what your co-pay for medications will be in advance as this is different depending on your insurance coverage. However, we may be able to find a substitute medication at lower cost or fill out paperwork to get insurance to cover a needed medication.   If a prior authorization is required to get your medication covered by your insurance company, please allow [place] 1-2 business days to complete this process.  Drug prices often vary depending on where the prescription is filled and some pharmacies may offer cheaper prices.  The website www.goodrx.com contains coupons for medications through different pharmacies. The prices here do not account for what the  cost may be with help from insurance (it may be cheaper with your insurance), but the website can give you the price if you did not use any insurance.  - You can print the associated coupon and take it with your prescription to the pharmacy.  - You may also stop by our office during regular business hours and pick up a GoodRx coupon card.  - If you need your prescription sent electronically to a different pharmacy, notify our office through [facility] or by phone at [phone] option 4.     Si Usted Necesita Algo Despus de Su Visita  Tambin puede enviarnos un mensaje a travs de Clinical cytogeneticist. Por lo general respondemos a los mensajes de MyChart en el transcurso de 1 a 2 das hbiles.  Para renovar recetas, por favor pida a su farmacia que se ponga en contacto con nuestra oficina. Annie Sable de fax es [place] [phone].  Si tiene un asunto urgente cuando la clnica est cerrada y que no puede esperar hasta el siguiente da hbil, puede llamar/localizar a su doctor(a) al nmero que aparece a continuacin.   Por favor, tenga en cuenta que aunque hacemos todo lo posible para estar disponibles para asuntos urgentes fuera del horario de [place], no estamos disponibles las 24 horas del da, los 7 [address]  [address] de la [place].   Si tiene un problema urgente y no puede comunicarse con nosotros, puede optar por buscar atencin mdica  en el consultorio de su doctor(a), en una clnica privada, en un centro de atencin urgente o en una sala de emergencias.  Si tiene Engineer, drilling, por favor llame inmediatamente al 911 o vaya a la sala de emergencias.  Nmeros de bper  - Dr. Gwen Pounds: [phone]  - Dra. Roseanne Reno: [phone]  - Dr. Katrinka Blazing: [phone]   En caso de inclemencias del tiempo, por favor llame a Lacy Duverney principal al [phone] para una actualizacin sobre el [place] de cualquier retraso o cierre.  Consejos para la medicacin en dermatologa: Por favor, guarde las cajas  en las que vienen los medicamentos de uso tpico para ayudarle a seguir las instrucciones sobre dnde y cmo usarlos. Las farmacias generalmente imprimen las instrucciones del medicamento slo en las cajas y no directamente en los tubos del [place].   Si su medicamento es muy caro, por favor, pngase en contacto con Rolm [place]ala llamando al [phone] y presione la opcin 4 o envenos un mensaje a travs de Clinical cytogeneticist.   No podemos decirle cul ser su copago por los medicamentos por adelantado ya que esto es diferente dependiendo de la cobertura de su seguro. Sin embargo, es posible que podamos encontrar un medicamento sustituto a Audiological scientist un formulario para que el seguro cubra el medicamento que se considera necesario.   Si se requiere una autorizacin previa para que su compaa de seguros [place] su medicamento, por favor permtanos de 1 a 2 das hbiles para completar [address]  Los precios de los medicamentos varan con frecuencia dependiendo del Environmental consultant de dnde se surte la receta y alguna farmacias pueden ofrecer precios ms baratos.  El sitio web www.goodrx.com tiene cupones para medicamentos de Health and safety inspector. Los precios aqu no tienen en cuenta lo que podra costar con la ayuda del seguro (puede ser ms barato con su seguro), pero el sitio web puede darle el precio si no utiliz Tourist information centre manager.  - Puede imprimir el cupn correspondiente y llevarlo con su receta a la farmacia.  - Tambin puede pasar por nuestra oficina durante el horario de atencin regular y Education officer, museum una tarjeta de cupones de GoodRx.  - Si necesita que su receta se enve electrnicamente a una farmacia diferente, informe a nuestra oficina a travs de MyChart de [place] o por telfono llamando al [phone] y presione la opcin 4.

## 2023-05-17 NOTE — Progress Notes (Signed)
 Follow-Up Visit   Subjective  Heather Singh is a 78 y.o. female who presents for the following: Skin Cancer Screening and Full Body Skin Exam. Hx of BCC. Hx of dysplastic nevus.   The patient presents for Total-Body Skin Exam (TBSE) for skin cancer screening and mole check. The patient has spots, moles and lesions to be evaluated, some may be new or changing and the patient may have concern these could be cancer. She has some spots that get itchy and irritated at eyebrow, chest, neck, leg.    The following portions of the chart were reviewed this encounter and updated as appropriate: medications, allergies, medical history  Review of Systems:  No other skin or systemic complaints except as noted in HPI or Assessment and Plan.  Objective  Well appearing patient in no apparent distress; mood and affect are within normal limits.  A full examination was performed including scalp, head, eyes, ears, nose, lips, neck, chest, axillae, abdomen, back, buttocks, bilateral upper extremities, bilateral lower extremities, hands, feet, fingers, toes, fingernails, and toenails. All findings within normal limits unless otherwise noted below.   Relevant physical exam findings are noted in the Assessment and Plan.  Right Lateral Eyebrow x1, L lower cheek x1, anterior neck x1, L popliteal x1, intermammary x1 (5) Pink scaly macules  Assessment & Plan   SKIN CANCER SCREENING PERFORMED TODAY.  HISTORY OF BASAL CELL CARCINOMA OF THE SKIN. Left brow. Mohs surgery. >25 years ago. - No evidence of recurrence today - Recommend regular full body skin exams - Recommend daily broad spectrum sunscreen SPF 30+ to sun-exposed areas, reapply every 2 hours as needed.  - Call if any new or changing lesions are noted between office visits  HISTORY OF DYSPLASTIC NEVUS. Right thigh, slight atypia, 01/17/2009. No evidence of recurrence today Recommend regular full body skin exams Recommend daily broad spectrum  sunscreen SPF 30+ to sun-exposed areas, reapply every 2 hours as needed.  Call if any new or changing lesions are noted between office visits   ACTINIC DAMAGE - Chronic condition, secondary to cumulative UV/sun exposure - diffuse scaly erythematous macules with underlying dyspigmentation - Recommend daily broad spectrum sunscreen SPF 30+ to sun-exposed areas, reapply every 2 hours as needed.  - Staying in the shade or wearing long sleeves, sun glasses (UVA+UVB protection) and wide brim hats (4-inch brim around the entire circumference of the hat) are also recommended for sun protection.  - Call for new or changing lesions.  LENTIGINES, SEBORRHEIC KERATOSES, HEMANGIOMAS - Benign normal skin lesions. Torso, face. - Benign-appearing - Call for any changes  MELANOCYTIC NEVI - Tan-brown and/or pink-flesh-colored symmetric macules and papules - Benign appearing on exam today - Observation - Call clinic for new or changing moles - Recommend daily use of broad spectrum spf 30+ sunscreen to sun-exposed areas.   Milia - tiny firm white papule at right paranasal - type of cyst - benign - sometimes these will clear with nightly OTC adapalene/Differin 0.1% gel or retinol. Effaclar sample given. - may be extracted if symptomatic - observe  INFLAMED SEBORRHEIC KERATOSIS (5) Right Lateral Eyebrow x1, L lower cheek x1, anterior neck x1, L popliteal x1, intermammary x1 (5) (VS Aks at face)   Symptomatic, irritating, patient would like treated. Can use OTC HC cream twice a day as needed for itching.  Destruction of lesion - Right Lateral Eyebrow x1, L lower cheek x1, anterior neck x1, L popliteal x1, intermammary x1 (5)  Destruction method: cryotherapy   Informed  consent: discussed and consent obtained   Lesion destroyed using liquid nitrogen: Yes   Region frozen until ice ball extended beyond lesion: Yes   Outcome: patient tolerated procedure well with no complications   Post-procedure  details: wound care instructions given   Additional details:  Prior to procedure, discussed risks of blister formation, small wound, skin dyspigmentation, or rare scar following cryotherapy. Recommend Vaseline ointment to treated areas while healing.     Return in about 1 year (around 05/16/2024) for TBSE, HxBCC, HxDN.  I, Lawson Radar, CMA, am acting as scribe for Willeen Niece, MD.   Documentation: I have reviewed the above documentation for accuracy and completeness, and I agree with the above.  Willeen Niece, MD

## 2023-06-16 ENCOUNTER — Ambulatory Visit: Payer: Self-pay

## 2023-06-16 VITALS — BP 120/76 | Ht 61.0 in | Wt 104.4 lb

## 2023-06-16 DIAGNOSIS — Z0000 Encounter for general adult medical examination without abnormal findings: Secondary | ICD-10-CM | POA: Diagnosis not present

## 2023-06-16 NOTE — Progress Notes (Signed)
 Subjective:   Heather Singh is a 78 y.o. who presents for a Medicare Wellness preventive visit.  Visit Complete: In person   Persons Participating in Visit: Patient.  AWV Questionnaire: No: Patient Medicare AWV questionnaire was not completed prior to this visit.  Cardiac Risk Factors include: advanced age (>12men, >75 women);dyslipidemia     Objective:    Today's Vitals   06/16/23 1134  BP: 120/76  Weight: 104 lb 6.4 oz (47.4 kg)  Height: 5\' 1"  (1.549 m)   Body mass index is 19.73 kg/m.     06/16/2023   11:42 AM 05/21/2022   10:02 AM 10/24/2021    6:13 PM 05/19/2021   10:19 AM 05/15/2020   10:12 AM 05/11/2019   10:11 AM 02/24/2019   12:49 PM  Advanced Directives  Does Patient Have a Medical Advance Directive? No Yes No Yes Yes Yes No;Yes  Type of Aeronautical engineer of [place];Living will Healthcare Power of [place];Living will Healthcare Power of [place];Living will Healthcare Power of [place];Living will  Copy of Healthcare Power of Attorney in Chart?    No - copy requested Yes - validated most recent copy scanned in chart (See row information) No - copy requested   Would patient like information on creating a medical advance directive? No - Patient declined          Current Medications (verified) Outpatient Encounter Medications as of 06/16/2023  Medication Sig   aspirin 81 MG EC tablet Take 1 tablet (81 mg total) by mouth daily. Swallow whole.   atorvastatin (LIPITOR) 80 MG tablet TAKE 1 TABLET(80 MG) BY MOUTH EVERY EVENING   calcium carbonate (TUMS EX) 750 MG chewable tablet Chew 1 tablet by mouth daily.   Cholecalciferol (VITAMIN D) 2000 units CAPS Take 1 capsule by mouth daily.   clopidogrel (PLAVIX) 75 MG tablet Take 1 tablet (75 mg total) by mouth daily.   denosumab (PROLIA) 60 MG/ML SOSY injection Inject 60 mg into the skin every 6 (six) months.   fexofenadine (ALLEGRA ALLERGY) 180 MG tablet Take 1 tablet (180 mg total) by mouth daily.    Iron, Ferrous Sulfate, 325 (65 Fe) MG TABS Take 325 mg by mouth every other day.   levothyroxine (SYNTHROID) 25 MCG tablet TAKE 1 TO 2 TABLETS BY MOUTH EVERY DAY BEFORE BREAKFAST, ALTERNATE EVERY OTHER DAY   Magnesium 400 MG TABS Take 1 tablet by mouth daily.   pantoprazole (PROTONIX) 40 MG tablet TAKE 1 TABLET BY MOUTH EVERY DAY, IN PLACE OF OMEPRAZOLE   vitamin B-12 (CYANOCOBALAMIN) 1000 MCG tablet Take 1,000 mcg by mouth 3 (three) times a week.   No facility-administered encounter medications on file as of 06/16/2023.    Allergies (verified) Alendronate, Levofloxacin, Nitrofurantoin monohyd macro, Sulfamethoxazole-trimethoprim, Etodolac, and Penicillins   History: Past Medical History:  Diagnosis Date   Allergy    Back pain    Basal cell carcinoma ~2000   L brow, treated with MOHs   Dyslipidemia    Dysplastic nevus 01/17/2009   right thigh, slight atypia    Fibrocystic breast disease    GERD (gastroesophageal reflux disease)    Hyperlipidemia    Menopause    Osteoporosis    Reflux esophagitis    RLS (restless legs syndrome)    Urinary dysfunction    Past Surgical History:  Procedure Laterality Date   BREAST CYST ASPIRATION Left    BREAST EXCISIONAL BIOPSY Left 1980's   neg   CATARACT EXTRACTION  EYE SURGERY     left   KYPHOPLASTY N/A 08/19/2016   Procedure: KYPHOPLASTY L3,L4;  Surgeon: Kennedy Bucker, MD;  Location: [facility] ORS;  Service: Orthopedics;  Laterality: N/A;   KYPHOPLASTY N/A 09/21/2016   Procedure: [id number];  Surgeon: Kennedy Bucker, MD;  Location: [facility] ORS;  Service: Orthopedics;  Laterality: N/A;   MOUTH SURGERY     POLYPECTOMY     SLING BLADE PROCEDURE     TOTAL ABDOMINAL HYSTERECTOMY     Family History  Problem Relation Age of Onset   Transient ischemic attack Mother    Atrial fibrillation Mother    Arthritis Mother        OA   Heart disease Father        CHF   CAD Father    Hyperlipidemia Sister    Arthritis Sister        OA   Stroke  Sister        Lived in a nursing home and died from COVID-19   Arthritis Sister        OA   Drug abuse Son    Hypertension Son    Liver disease Son    Alcoholism Son    Breast cancer Maternal Aunt    Breast cancer Paternal Aunt    Heart failure Other    Coronary artery disease Other    Stroke Other    Hyperlipidemia Other    Hypertension Other    Thyroid disease Other    Social History   Socioeconomic History   Marital status: Married    Spouse name: Not on file   Number of children: 1   Years of education: Not on file   Highest education level: 12th grade  Occupational History   Not on file  Tobacco Use   Smoking status: Never   Smokeless tobacco: Never  Vaping Use   Vaping status: Never Used  Substance and Sexual Activity   Alcohol use: No    Alcohol/week: 0.0 standard drinks of alcohol   Drug use: No   Sexual activity: Yes    Partners: Male  Other Topics Concern   Not on file  Social History Narrative   Married   Gets regular exercise   One grown son having medical problems and moved in with since 2021   Social Drivers of Health   Financial Resource Strain: Low Risk  (06/16/2023)   Overall Financial Resource Strain (CARDIA)    Difficulty of Paying Living Expenses: Not hard at all  Food Insecurity: No Food Insecurity (06/16/2023)   Hunger Vital Sign    Worried About Running Out of Food in the Last Year: Never true    Ran Out of Food in the Last Year: Never true  Transportation Needs: No Transportation Needs (06/16/2023)   PRAPARE - Administrator, Civil Service (Medical): No    Lack of Transportation (Non-Medical): No  Physical Activity: Sufficiently Active (06/16/2023)   Exercise Vital Sign    Days of Exercise per Week: 4 days    Minutes of Exercise per Session: 60 min  Stress: No Stress Concern Present (06/16/2023)   [employer] of Occupational Health - Occupational Stress Questionnaire    Feeling of Stress : Not at all  Social Connections:  Moderately Integrated (06/16/2023)   Social Connection and Isolation Panel [NHANES]    Frequency of Communication with Friends and Family: More than three times a week    Frequency of Social Gatherings with Friends and Family: Once a  week    Attends Religious Services: More than 4 times per year    Active Member of Clubs or Organizations: No    Attends Banker Meetings: Never    Marital Status: Married    Tobacco Counseling Counseling given: Not Answered    Clinical Intake:  Pre-visit preparation completed: Yes  Pain : No/denies pain     BMI - recorded: 19.73 Nutritional Status: BMI of 19-24  Normal Nutritional Risks: None Diabetes: No  Lab Results  Component Value Date   HGBA1C 5.4 10/24/2021   HGBA1C 5.4 11/02/2019     How often do you need to have someone help you when you read instructions, pamphlets, or other written materials from your doctor or pharmacy?: 1 - Never  Interpreter Needed?: No  Information entered by :: Kennedy Bucker, LPN   Activities of Daily Living    06/16/2023   11:44 AM 12/24/2022   10:54 AM  In your present state of health, do you have any difficulty performing the following activities:  Hearing? 1 1  Vision? 0 0  Difficulty concentrating or making decisions? 0 0  Walking or climbing stairs? 0 0  Dressing or bathing? 0 0  Doing errands, shopping? 0 0  Preparing Food and eating ? N   Using the Toilet? N   In the past six months, have you accidently leaked urine? N   Do you have problems with loss of bowel control? N   Managing your Medications? N   Managing your Finances? N   Housekeeping or managing your Housekeeping? N     Patient Care Team: Alba Cory, MD as PCP - General (Family Medicine) Linus Salmons, MD as Consulting Physician (Otolaryngology) Tedd Sias Marlana Salvage, MD as Consulting Physician (Endocrinology) Willeen Niece, MD (Dermatology) Linus Galas, [place] (Podiatry) Dingeldein, Viviann Spare, MD  (Ophthalmology)  Indicate any recent Medical Services you may have received from other than [facility] providers in the past year (date may be approximate).     Assessment:   This is a routine wellness examination for [place].  Hearing/Vision screen Hearing Screening - Comments:: WEARS AIDS, BOTH EARS Vision Screening - Comments:: WEARS GLASSES ALL THE TIME- DR.[email or web address]   Goals Addressed             This Visit's Progress    DIET - EAT MORE FRUITS AND VEGETABLES         Depression Screen     06/16/2023   11:40 AM 12/24/2022   10:53 AM 07/05/2022   11:02 AM 06/23/2022    8:57 AM 05/21/2022    9:54 AM 03/02/2022   10:47 AM 01/27/2022    7:58 AM  PHQ 2/9 Scores  PHQ - 2 Score 0 0 0 0 0 0 0  PHQ- 9 Score 0 0 0 0  0 0    Fall Risk     06/16/2023   11:43 AM 12/24/2022   10:53 AM 07/05/2022   11:02 AM 06/23/2022    8:57 AM 05/21/2022    9:48 AM  Fall Risk   Falls in the past year? 0 0 0 0 0  Number falls in past yr: 0 0 0 0 0  Injury with Fall? 0 0 0 0 0  Risk for fall due to : No Fall Risks No Fall Risks  No Fall Risks No Fall Risks  Follow up Falls prevention discussed;Falls evaluation completed Falls prevention discussed  Falls prevention discussed Falls prevention discussed;Education provided    MEDICARE RISK AT HOME:  Medicare  Risk at Home Any stairs in or around the home?: Yes If so, are there any without handrails?: No Home free of loose throw rugs in walkways, pet beds, electrical cords, etc?: Yes Adequate lighting in your home to reduce risk of falls?: Yes Life alert?: No Use of a cane, walker or w/c?: No Grab bars in the bathroom?: No Shower chair or bench in shower?: No Elevated toilet seat or a handicapped toilet?: Yes  TIMED UP AND GO:  Was the test performed?  Yes  Length of time to ambulate 10 feet: 4 sec Gait steady and fast without use of assistive device  Cognitive Function: 6CIT completed        06/16/2023   11:45 AM 05/21/2022   10:05 AM 05/11/2019    10:14 AM 04/28/2018   10:28 AM  6CIT Screen  What Year? 0 points 0 points 0 points 0 points  What month? 0 points 0 points 0 points 0 points  What time? 0 points 0 points 0 points 0 points  Count back from 20 0 points 0 points 0 points 0 points  Months in reverse 0 points 0 points 0 points 0 points  Repeat phrase 0 points 0 points 0 points 0 points  Total Score 0 points 0 points 0 points 0 points    Immunizations Immunization History  Administered Date(s) Administered   Fluad Quad(high Dose 65+) 12/19/2018, 11/23/2019, 01/01/2021, 12/17/2021   Fluad Trivalent(High Dose 65+) 12/24/2022   Influenza Split 02/25/2009, 11/27/2012   Influenza, High Dose Seasonal PF 11/13/2014, 11/17/2016   Influenza, Seasonal, Injecte, Preservative Fre 12/01/2009, 12/30/2010, 11/19/2011, 12/20/2013   Influenza,inj,Quad PF,6+ Mos 01/07/2016   Influenza-Unspecified 02/02/2018   Moderna Covid-19 Fall Seasonal Vaccine 90yrs & older 12/03/2022   PFIZER(Purple Top)SARS-COV-2 Vaccination 03/30/2019, 04/20/2019, 12/13/2019, 07/18/2020, 01/16/2021   Pfizer(Comirnaty)Fall Seasonal Vaccine 12 years and older 02/12/2022   Pneumococcal Conjugate-13 10/21/2014   Pneumococcal Polysaccharide-23 07/29/2008   Tdap 07/26/2007, 03/30/2016   Zoster Recombinant(Shingrix) 10/21/2017, 01/02/2018   Zoster, Live 02/25/2009    Screening Tests Health Maintenance  Topic Date Due   Colonoscopy  05/30/2023   COVID-19 Vaccine (8 - Pfizer risk 2024-25 season) 06/02/2023   INFLUENZA VACCINE  10/14/2023   MAMMOGRAM  03/30/2024   Medicare Annual Wellness (AWV)  06/15/2024   DEXA SCAN  12/05/2024   DTaP/Tdap/Td (3 - Td or Tdap) 03/30/2026   Pneumonia Vaccine 84+ Years old  Completed   Zoster Vaccines- Shingrix  Completed   HPV VACCINES  Aged Out   Hepatitis C Screening  Discontinued    Health Maintenance  Health Maintenance Due  Topic Date Due   Colonoscopy  05/30/2023   COVID-19 Vaccine (8 - Pfizer risk 2024-25 season)  06/02/2023   Health Maintenance Items Addressed: UP TO DATE ON SHOTS, MAMMOGRAM & BONE DENSITY- AGED OUT FOR COLONOSCOPY  Additional Screening:  Vision Screening: Recommended annual ophthalmology exams for early detection of glaucoma and other disorders of the eye.  Dental Screening: Recommended annual dental exams for proper oral hygiene  Community Resource Referral / Chronic Care Management: CRR required this visit?  No   CCM required this visit?  No     Plan:     I have personally reviewed and noted the following in the patient's chart:   Medical and social history Use of alcohol, tobacco or illicit drugs  Current medications and supplements including opioid prescriptions. Patient is not currently taking opioid prescriptions. Functional ability and status Nutritional status Physical activity Advanced directives List of other physicians  Hospitalizations, surgeries, and ER visits in previous 12 months Vitals Screenings to include cognitive, depression, and falls Referrals and appointments  In addition, I have reviewed and discussed with patient certain preventive protocols, quality metrics, and best practice recommendations. A written personalized care plan for preventive services as well as general preventive health recommendations were provided to patient.     Hal Hope, LPN   11/18/6211   After Visit Summary: (In Person-Declined) Patient declined AVS at this time.  Notes: Nothing significant to report at this time.

## 2023-06-16 NOTE — Patient Instructions (Addendum)
 Heather Singh , Thank you for taking time to come for your Medicare Wellness Visit. I appreciate your ongoing commitment to your health goals. Please review the following plan we discussed and let me know if I can assist you in the future.   Referrals/Orders/Follow-Ups/Clinician Recommendations: NONE  This is a list of the screening recommended for you and due dates:  Health Maintenance  Topic Date Due   Colon Cancer Screening  05/30/2023   COVID-19 Vaccine (8 - Pfizer risk 2024-25 season) 06/02/2023   Flu Shot  10/14/2023   Mammogram  03/30/2024   Medicare Annual Wellness Visit  06/15/2024   DEXA scan (bone density measurement)  12/05/2024   DTaP/Tdap/Td vaccine (3 - Td or Tdap) 03/30/2026   Pneumonia Vaccine  Completed   Zoster (Shingles) Vaccine  Completed   HPV Vaccine  Aged Out   Hepatitis C Screening  Discontinued    Advanced directives: (ACP Link)Information on Advanced Care Planning can be found at [facility] Advance Health Care Directives Advance Health Care Directives. [email or web address]   Next Medicare Annual Wellness Visit scheduled for next year: Yes  06/21/24 @ 11:30 AM IN PERSON

## 2023-06-23 ENCOUNTER — Ambulatory Visit (INDEPENDENT_AMBULATORY_CARE_PROVIDER_SITE_OTHER): Payer: Self-pay | Admitting: Family Medicine

## 2023-06-23 ENCOUNTER — Ambulatory Visit: Payer: Medicare Other | Admitting: Family Medicine

## 2023-06-23 ENCOUNTER — Encounter: Payer: Self-pay | Admitting: Family Medicine

## 2023-06-23 VITALS — BP 132/70 | HR 77 | Resp 16 | Ht 61.0 in | Wt 105.3 lb

## 2023-06-23 DIAGNOSIS — Z800 Family history of malignant neoplasm of digestive organs: Secondary | ICD-10-CM | POA: Diagnosis not present

## 2023-06-23 DIAGNOSIS — K219 Gastro-esophageal reflux disease without esophagitis: Secondary | ICD-10-CM | POA: Diagnosis not present

## 2023-06-23 DIAGNOSIS — Z8673 Personal history of transient ischemic attack (TIA), and cerebral infarction without residual deficits: Secondary | ICD-10-CM | POA: Diagnosis not present

## 2023-06-23 DIAGNOSIS — D708 Other neutropenia: Secondary | ICD-10-CM | POA: Diagnosis not present

## 2023-06-23 DIAGNOSIS — I739 Peripheral vascular disease, unspecified: Secondary | ICD-10-CM

## 2023-06-23 DIAGNOSIS — I700 Atherosclerosis of aorta: Secondary | ICD-10-CM

## 2023-06-23 DIAGNOSIS — Z1211 Encounter for screening for malignant neoplasm of colon: Secondary | ICD-10-CM

## 2023-06-23 DIAGNOSIS — Z862 Personal history of diseases of the blood and blood-forming organs and certain disorders involving the immune mechanism: Secondary | ICD-10-CM

## 2023-06-23 DIAGNOSIS — E039 Hypothyroidism, unspecified: Secondary | ICD-10-CM

## 2023-06-23 DIAGNOSIS — M810 Age-related osteoporosis without current pathological fracture: Secondary | ICD-10-CM | POA: Diagnosis not present

## 2023-06-23 DIAGNOSIS — E785 Hyperlipidemia, unspecified: Secondary | ICD-10-CM | POA: Diagnosis not present

## 2023-06-23 MED ORDER — ATORVASTATIN CALCIUM 80 MG PO TABS: ORAL_TABLET | ORAL | 1 refills | Status: DC

## 2023-06-23 MED ORDER — CLOPIDOGREL BISULFATE 75 MG PO TABS: 75.0000 mg | ORAL_TABLET | Freq: Every day | ORAL | 1 refills | Status: DC

## 2023-06-23 MED ORDER — LEVOTHYROXINE SODIUM 25 MCG PO TABS: ORAL_TABLET | ORAL | 1 refills | Status: DC

## 2023-06-23 MED ORDER — PANTOPRAZOLE SODIUM 40 MG PO TBEC: DELAYED_RELEASE_TABLET | ORAL | 1 refills | Status: DC

## 2023-06-23 MED ORDER — EZETIMIBE 10 MG PO TABS
10.0000 mg | ORAL_TABLET | Freq: Every day | ORAL | 3 refills | Status: DC
Start: 2023-06-23 — End: 2023-12-23

## 2023-06-23 NOTE — Patient Instructions (Signed)
Lumosity

## 2023-06-23 NOTE — Progress Notes (Signed)
 Name: Heather Singh   MRN: [id number]    DOB: 12-05-1945   Date:06/23/2023       Progress Note  Subjective  Chief Complaint  Chief Complaint  Patient presents with   Medical Management of Chronic Issues   Discussed the use of AI scribe software for clinical note transcription with the patient, who gave verbal consent to proceed.  History of Present Illness Heather Singh is a 78 year old female with a history of TIA and small vessel disease who presents for a six-month follow-up.  Shortly after her last visit, she experienced facial numbness and went to the emergency room. Imaging and evaluation by a neurologist suggested a possible TIA or trigeminal neuralgia. The symptoms have since resolved, and she has not experienced any further episodes. She has been taking high dose statin , aspirin and plavix   She has a history of small vessel disease of the brain and a remote lacunar infarct in the right thalamus, identified on an MRI in August 2023. She experiences occasional expressive aphasia, particularly after exercise, which resolves after a pause.  Hypothyroidism has been controlled with levothyroxine daily.  No recent hair loss or changes in bowel movements related to her thyroid condition.  .She experiences occasional GERD symptoms, managed with daily pantoprazole. She also takes iron supplements, though her recent CBC showed slightly low levels, and her iron storage was normal.   She has osteoporosis, for which she receives Prolia injections by Endo    Patient Active Problem List   Diagnosis Date Noted   TIA (transient ischemic attack) 10/24/2021   Cerebral microvascular disease 04/13/2021   Hx of adenomatous colonic polyps 05/19/2018   Senile purpura (HCC) 04/28/2018   Closed compression fracture of L3 lumbar vertebra, sequela 08/23/2016   Atrophic vaginitis 06/05/2015   Acquired hypothyroidism 10/19/2014   Perennial allergic rhinitis 10/19/2014   B12 deficiency 10/19/2014    Fibrocystic breast disease 10/19/2014   GERD without esophagitis 10/19/2014   OP (osteoporosis) 10/19/2014   Ovarian failure 10/19/2014   Hiatal hernia 10/19/2014   Restless legs syndrome 10/19/2014   Dyslipidemia 08/23/2008   Hyperlipidemia 08/23/2008   Vitamin D deficiency 10/10/2007    Past Surgical History:  Procedure Laterality Date   BREAST CYST ASPIRATION Left    BREAST EXCISIONAL BIOPSY Left 1980's   neg   CATARACT EXTRACTION     EYE SURGERY     left   KYPHOPLASTY N/A 08/19/2016   Procedure: KYPHOPLASTY L3,L4;  Surgeon: Kennedy Bucker, MD;  Location: [facility] ORS;  Service: Orthopedics;  Laterality: N/A;   KYPHOPLASTY N/A 09/21/2016   Procedure: [id number];  Surgeon: Kennedy Bucker, MD;  Location: [facility] ORS;  Service: Orthopedics;  Laterality: N/A;   MOUTH SURGERY     POLYPECTOMY     SLING BLADE PROCEDURE     TOTAL ABDOMINAL HYSTERECTOMY      Family History  Problem Relation Age of Onset   Transient ischemic attack Mother    Atrial fibrillation Mother    Arthritis Mother        OA   Heart disease Father        CHF   CAD Father    Hyperlipidemia Sister    Arthritis Sister        OA   Stroke Sister        Lived in a nursing home and died from COVID-19   Arthritis Sister        OA   Drug abuse Son  Hypertension Son    Liver disease Son    Alcoholism Son    Breast cancer Maternal Aunt    Breast cancer Paternal Aunt    Heart failure Other    Coronary artery disease Other    Stroke Other    Hyperlipidemia Other    Hypertension Other    Thyroid disease Other     Social History   Tobacco Use   Smoking status: Never   Smokeless tobacco: Never  Substance Use Topics   Alcohol use: No    Alcohol/week: 0.0 standard drinks of alcohol     Current Outpatient Medications:    aspirin 81 MG EC tablet, Take 1 tablet (81 mg total) by mouth daily. Swallow whole., Disp: 30 tablet, Rfl: 12   atorvastatin (LIPITOR) 80 MG tablet, TAKE 1 TABLET(80 MG) BY MOUTH  EVERY EVENING, Disp: 90 tablet, Rfl: 1   calcium carbonate (TUMS EX) 750 MG chewable tablet, Chew 1 tablet by mouth daily., Disp: , Rfl:    Cholecalciferol (VITAMIN D) 2000 units CAPS, Take 1 capsule by mouth daily., Disp: , Rfl:    clopidogrel (PLAVIX) 75 MG tablet, Take 1 tablet (75 mg total) by mouth daily., Disp: 90 tablet, Rfl: 1   denosumab (PROLIA) 60 MG/ML SOSY injection, Inject 60 mg into the skin every 6 (six) months., Disp: , Rfl:    fexofenadine (ALLEGRA ALLERGY) 180 MG tablet, Take 1 tablet (180 mg total) by mouth daily., Disp: 90 tablet, Rfl: 0   Iron, Ferrous Sulfate, 325 (65 Fe) MG TABS, Take 325 mg by mouth every other day., Disp: 45 tablet, Rfl: 1   levothyroxine (SYNTHROID) 25 MCG tablet, TAKE 1 TO 2 TABLETS BY MOUTH EVERY DAY BEFORE BREAKFAST, ALTERNATE EVERY OTHER DAY, Disp: 135 tablet, Rfl: 1   Magnesium 400 MG TABS, Take 1 tablet by mouth daily., Disp: , Rfl:    pantoprazole (PROTONIX) 40 MG tablet, TAKE 1 TABLET BY MOUTH EVERY DAY, IN PLACE OF OMEPRAZOLE, Disp: 90 tablet, Rfl: 1   vitamin B-12 (CYANOCOBALAMIN) 1000 MCG tablet, Take 1,000 mcg by mouth 3 (three) times a week., Disp: , Rfl:   Allergies  Allergen Reactions   Alendronate     Dyspepsia, inflamed esophagus    Levofloxacin Hives   Nitrofurantoin Monohyd Macro Hives   Sulfamethoxazole-Trimethoprim Hives   Etodolac Hives   Penicillins Hives and Rash    Has patient had a PCN reaction causing immediate rash, facial/tongue/throat swelling, SOB or lightheadedness with hypotension: No Has patient had a PCN reaction causing severe rash involving mucus membranes or skin necrosis: No Has patient had a PCN reaction that required hospitalization: No Has patient had a PCN reaction occurring within the last 10 years: No If all of the above answers are "NO", then may proceed with Cephalosporin use.     I personally reviewed active problem list, medication list, allergies with the patient/caregiver today.   ROS  Ten  systems reviewed and is negative except as mentioned in HPI    Objective Physical Exam Constitutional: Patient appears well-developed and well-nourished. No distress.  HEENT: head atraumatic, normocephalic, pupils equal and reactive to light, neck supple Cardiovascular: Normal rate, regular rhythm and normal heart sounds.  No murmur heard. No BLE edema. Pulmonary/Chest: Effort normal and breath sounds normal. No respiratory distress. Abdominal: Soft.  There is no tenderness. Psychiatric: Patient has a normal mood and affect. behavior is normal. Judgment and thought content normal.   Vitals:   06/23/23 0925  BP: 132/70  Pulse: 77  Resp: 16  SpO2: 99%  Weight: 105 lb 4.8 oz (47.8 kg)  Height: 5\' 1"  (1.549 m)    Body mass index is 19.9 kg/m.   PHQ2/9:    06/16/2023   11:40 AM 12/24/2022   10:53 AM 07/05/2022   11:02 AM 06/23/2022    8:57 AM 05/21/2022    9:54 AM  Depression screen PHQ 2/9  Decreased Interest 0 0 0 0 0  Down, Depressed, Hopeless 0 0 0 0 0  PHQ - 2 Score 0 0 0 0 0  Altered sleeping 0 0 0 0   Tired, decreased energy 0 0 0 0   Change in appetite 0 0 0 0   Feeling bad or failure about yourself  0 0 0 0   Trouble concentrating 0 0 0 0   Moving slowly or fidgety/restless 0 0 0 0   Suicidal thoughts 0 0 0 0   PHQ-9 Score 0 0 0 0   Difficult doing work/chores Not difficult at all  Not difficult at all      phq 9 is negative  Fall Risk:    06/16/2023   11:43 AM 12/24/2022   10:53 AM 07/05/2022   11:02 AM 06/23/2022    8:57 AM 05/21/2022    9:48 AM  Fall Risk   Falls in the past year? 0 0 0 0 0  Number falls in past yr: 0 0 0 0 0  Injury with Fall? 0 0 0 0 0  Risk for fall due to : No Fall Risks No Fall Risks  No Fall Risks No Fall Risks  Follow up Falls prevention discussed;Falls evaluation completed Falls prevention discussed  Falls prevention discussed Falls prevention discussed;Education provided      Assessment & Plan Transient Ischemic Attack (TIA)/Old  CVA History of TIA with recent facial numbness, resolved without recurrence. Neurologist suggested trigeminal neuralgia. Continued risk due to small vessel disease and previous lacunar infarct. - Continue aspirin and Plavix. - Educated on TIA symptoms and importance of immediate medical attention. - add Zetia to Atorvastatin 80 mg to get LDL below 70   Small Vessel Disease of the Brain Chronic condition with potential cognitive dysfunction. Mild cognitive dysfunction discussed, not severe enough for dementia. Emphasized proactive management. - Discuss cognitive symptoms with neurologist for evaluation and treatment. - Engage in mental exercises using apps like Lumosity. - Maintain clean diet, regular physical activity, and mental exercises.  Hyperlipidemia LDL above target at 80 mg/dL. Plan to add ezetimibe to atorvastatin to lower LDL and reduce plaque formation. - Add ezetimibe 10 mg daily to atorvastatin. - Recheck cholesterol in six months.  Acquired Hypothyroidism Well-managed on levothyroxine. Last thyroid function test normal. - Continue levothyroxine 25 mcg daily. - Recheck thyroid function in the fall.  Gastroesophageal Reflux Disease (GERD) Occasional symptoms managed with pantoprazole. - Continue pantoprazole daily.  History of ron Deficiency Anemia Slightly low CBC, normal iron storage. Suspected anemia of chronic disease. - Order CBC and iron studies. - Consider discontinuing iron supplementation based on results.  Osteoporosis- age related Managed with Prolia injections. No recent fractures. - Continue Prolia injections as scheduled. - Follow up with endocrinologist.  General Health Maintenance Discussed healthy weight gain with nutrient-dense foods. Emphasized avoiding processed foods. - Incorporate high-calorie, nutrient-dense foods like nuts, Greek yogurt, and peanut butter. - Use Yuka app to evaluate food quality. - Consider protein shakes with minimal  additives.  Colorectal Cancer Screening Family history of colon cancer. Last colonoscopy at age 1  with polyp removal. Decided on Cologuard due to family history. - Order Cologuard test. - If positive, consider colonoscopy.

## 2023-06-24 ENCOUNTER — Ambulatory Visit: Payer: Medicare Other | Admitting: Family Medicine

## 2023-06-24 LAB — CBC WITH DIFFERENTIAL/PLATELET
Absolute Lymphocytes: 1112 {cells}/uL (ref 850–3900)
Absolute Monocytes: 387 {cells}/uL (ref 200–950)
Basophils Absolute: 10 {cells}/uL (ref 0–200)
Basophils Relative: 0.2 %
Eosinophils Absolute: 29 {cells}/uL (ref 15–500)
Eosinophils Relative: 0.6 %
HCT: 34.4 % — ABNORMAL LOW (ref 35.0–45.0)
Hemoglobin: 11.4 g/dL — ABNORMAL LOW (ref 11.7–15.5)
MCH: 32.7 pg (ref 27.0–33.0)
MCHC: 33.1 g/dL (ref 32.0–36.0)
MCV: 98.6 fL (ref 80.0–100.0)
MPV: 10.9 fL (ref 7.5–12.5)
Monocytes Relative: 7.9 %
Neutro Abs: 3361 {cells}/uL (ref 1500–7800)
Neutrophils Relative %: 68.6 %
Platelets: 226 10*3/uL (ref 140–400)
RBC: 3.49 10*6/uL — ABNORMAL LOW (ref 3.80–5.10)
RDW: 12.1 % (ref 11.0–15.0)
Total Lymphocyte: 22.7 %
WBC: 4.9 10*3/uL (ref 3.8–10.8)

## 2023-06-24 LAB — IRON,TIBC AND FERRITIN PANEL
%SAT: 36 % (ref 16–45)
Ferritin: 81 ng/mL (ref 16–288)
Iron: 114 ug/dL (ref 45–160)
TIBC: 317 ug/dL (ref 250–450)

## 2023-06-29 DIAGNOSIS — Z1211 Encounter for screening for malignant neoplasm of colon: Secondary | ICD-10-CM | POA: Diagnosis not present

## 2023-07-04 ENCOUNTER — Encounter: Payer: Self-pay | Admitting: Family Medicine

## 2023-07-06 LAB — COLOGUARD: COLOGUARD: NEGATIVE

## 2023-07-18 DIAGNOSIS — H40003 Preglaucoma, unspecified, bilateral: Secondary | ICD-10-CM | POA: Diagnosis not present

## 2023-07-25 DIAGNOSIS — H40003 Preglaucoma, unspecified, bilateral: Secondary | ICD-10-CM | POA: Diagnosis not present

## 2023-07-26 DIAGNOSIS — L03811 Cellulitis of head [any part, except face]: Secondary | ICD-10-CM | POA: Diagnosis not present

## 2023-07-27 DIAGNOSIS — R200 Anesthesia of skin: Secondary | ICD-10-CM | POA: Diagnosis not present

## 2023-07-27 DIAGNOSIS — R410 Disorientation, unspecified: Secondary | ICD-10-CM | POA: Diagnosis not present

## 2023-07-27 DIAGNOSIS — R519 Headache, unspecified: Secondary | ICD-10-CM | POA: Diagnosis not present

## 2023-07-27 DIAGNOSIS — Z8673 Personal history of transient ischemic attack (TIA), and cerebral infarction without residual deficits: Secondary | ICD-10-CM | POA: Diagnosis not present

## 2023-07-27 DIAGNOSIS — R5383 Other fatigue: Secondary | ICD-10-CM | POA: Diagnosis not present

## 2023-08-01 DIAGNOSIS — M810 Age-related osteoporosis without current pathological fracture: Secondary | ICD-10-CM | POA: Diagnosis not present

## 2023-08-09 DIAGNOSIS — H60332 Swimmer's ear, left ear: Secondary | ICD-10-CM | POA: Diagnosis not present

## 2023-08-09 DIAGNOSIS — R070 Pain in throat: Secondary | ICD-10-CM | POA: Diagnosis not present

## 2023-08-11 ENCOUNTER — Other Ambulatory Visit: Payer: Self-pay | Admitting: Family Medicine

## 2023-08-11 DIAGNOSIS — K219 Gastro-esophageal reflux disease without esophagitis: Secondary | ICD-10-CM

## 2023-08-25 DIAGNOSIS — H6123 Impacted cerumen, bilateral: Secondary | ICD-10-CM | POA: Diagnosis not present

## 2023-08-25 DIAGNOSIS — H60332 Swimmer's ear, left ear: Secondary | ICD-10-CM | POA: Diagnosis not present

## 2023-09-14 DIAGNOSIS — H60332 Swimmer's ear, left ear: Secondary | ICD-10-CM | POA: Diagnosis not present

## 2023-10-21 DIAGNOSIS — H04123 Dry eye syndrome of bilateral lacrimal glands: Secondary | ICD-10-CM | POA: Diagnosis not present

## 2023-12-15 DIAGNOSIS — M810 Age-related osteoporosis without current pathological fracture: Secondary | ICD-10-CM | POA: Diagnosis not present

## 2023-12-22 DIAGNOSIS — M810 Age-related osteoporosis without current pathological fracture: Secondary | ICD-10-CM | POA: Diagnosis not present

## 2023-12-23 ENCOUNTER — Encounter: Payer: Self-pay | Admitting: Family Medicine

## 2023-12-23 ENCOUNTER — Ambulatory Visit (INDEPENDENT_AMBULATORY_CARE_PROVIDER_SITE_OTHER): Admitting: Family Medicine

## 2023-12-23 VITALS — BP 124/74 | HR 77 | Resp 16 | Ht 61.0 in | Wt 104.4 lb

## 2023-12-23 DIAGNOSIS — E039 Hypothyroidism, unspecified: Secondary | ICD-10-CM | POA: Diagnosis not present

## 2023-12-23 DIAGNOSIS — K219 Gastro-esophageal reflux disease without esophagitis: Secondary | ICD-10-CM | POA: Diagnosis not present

## 2023-12-23 DIAGNOSIS — D638 Anemia in other chronic diseases classified elsewhere: Secondary | ICD-10-CM | POA: Insufficient documentation

## 2023-12-23 DIAGNOSIS — E785 Hyperlipidemia, unspecified: Secondary | ICD-10-CM

## 2023-12-23 DIAGNOSIS — I700 Atherosclerosis of aorta: Secondary | ICD-10-CM | POA: Diagnosis not present

## 2023-12-23 DIAGNOSIS — Z23 Encounter for immunization: Secondary | ICD-10-CM | POA: Diagnosis not present

## 2023-12-23 DIAGNOSIS — Z8673 Personal history of transient ischemic attack (TIA), and cerebral infarction without residual deficits: Secondary | ICD-10-CM | POA: Insufficient documentation

## 2023-12-23 DIAGNOSIS — M810 Age-related osteoporosis without current pathological fracture: Secondary | ICD-10-CM

## 2023-12-23 LAB — LIPID PANEL
Cholesterol: 151 mg/dL (ref <200)
HDL: 82 mg/dL (ref >=50)
LDL Cholesterol (Calc): 55 mg/dL
Non-HDL Cholesterol (Calc): 69 mg/dL (ref <130)
Total CHOL/HDL Ratio: 1.8 (calc) (ref <5.0)
Triglycerides: 49 mg/dL (ref <150)

## 2023-12-23 LAB — CBC WITH DIFFERENTIAL/PLATELET
Absolute Lymphocytes: 1111 {cells}/uL (ref 850–3900)
Absolute Monocytes: 369 {cells}/uL (ref 200–950)
Basophils Absolute: 21 {cells}/uL (ref 0–200)
Basophils Relative: 0.5 %
Eosinophils Absolute: 29 {cells}/uL (ref 15–500)
Eosinophils Relative: 0.7 %
HCT: 36 % (ref 35.0–45.0)
Hemoglobin: 12 g/dL (ref 11.7–15.5)
MCH: 33.3 pg — ABNORMAL HIGH (ref 27.0–33.0)
MCHC: 33.3 g/dL (ref 32.0–36.0)
MCV: 100 fL (ref 80.0–100.0)
MPV: 10.7 fL (ref 7.5–12.5)
Monocytes Relative: 9 %
Neutro Abs: 2571 {cells}/uL (ref 1500–7800)
Neutrophils Relative %: 62.7 %
Platelets: 219 Thousand/uL (ref 140–400)
RBC: 3.6 Million/uL — ABNORMAL LOW (ref 3.80–5.10)
RDW: 11.7 % (ref 11.0–15.0)
Total Lymphocyte: 27.1 %
WBC: 4.1 Thousand/uL (ref 3.8–10.8)

## 2023-12-23 LAB — TSH: TSH: 1.6 m[IU]/L (ref 0.40–4.50)

## 2023-12-23 MED ORDER — LEVOTHYROXINE SODIUM 25 MCG PO TABS: ORAL_TABLET | ORAL | 1 refills | Status: AC

## 2023-12-23 MED ORDER — ATORVASTATIN CALCIUM 80 MG PO TABS: ORAL_TABLET | ORAL | 1 refills | Status: AC

## 2023-12-23 MED ORDER — PANTOPRAZOLE SODIUM 40 MG PO TBEC: DELAYED_RELEASE_TABLET | ORAL | 1 refills | Status: AC

## 2023-12-23 MED ORDER — FAMOTIDINE 40 MG PO TABS: 40.0000 mg | ORAL_TABLET | Freq: Every evening | ORAL | 1 refills | Status: AC | PRN

## 2023-12-23 MED ORDER — CLOPIDOGREL BISULFATE 75 MG PO TABS: 75.0000 mg | ORAL_TABLET | Freq: Every day | ORAL | 1 refills | Status: AC

## 2023-12-23 MED ORDER — EZETIMIBE 10 MG PO TABS: 10.0000 mg | ORAL_TABLET | Freq: Every day | ORAL | 3 refills | Status: AC

## 2023-12-23 NOTE — Progress Notes (Signed)
 Name: Heather Singh   MRN: [id number]    DOB: 1946/01/02   Date:12/23/2023       Progress Note  Subjective  Chief Complaint  Chief Complaint  Patient presents with   Medical Management of Chronic Issues   Discussed the use of AI scribe software for clinical note transcription with the patient, who gave verbal consent to proceed.  History of Present Illness Heather Singh is an 78 year old female who presents for a follow-up visit.  She has anemia of chronic disease with stable hemoglobin levels ranging from 11.4 to 12.5 over the past year. No shortness of breath or extreme fatigue. There is a family history of anemia, with her mother and sister also affected.  She is on ezetimibe , atorvastatin , and clopidogrel  for hyperlipidemia, atherosclerosis of aorta and presvious history of TIA.   No muscle aches or symptoms related to her previous mini stroke, such as chest pain or tightness.  Intermittent gastric pain is noted, located in the middle of the abdomen, described as non-constant. She has a history of reflux and takes pantoprazole  daily. She avoids spicy foods and limits caffeine  intake to decaf coffee. She primarily drinks water and stays upright after meals due to her hiatal hernia, which she believes helps her symptoms.  She is on thyroid  medication, alternating between one and two 25 microgram doses. No hair loss, dry skin, or changes in bowel movements.  She has age-related osteoporosis and receives Prolia  injections. Recent blood work indicated a slight improvement in bone density.  She is considering getting the COVID vaccine and has not experienced any issues with previous vaccinations.    Patient Active Problem List   Diagnosis Date Noted   TIA (transient ischemic attack) 10/24/2021   Cerebral microvascular disease 04/13/2021   Hx of adenomatous colonic polyps 05/19/2018   Senile purpura 04/28/2018   Closed compression fracture of L3 lumbar vertebra, sequela 08/23/2016    Atrophic vaginitis 06/05/2015   Acquired hypothyroidism 10/19/2014   Perennial allergic rhinitis 10/19/2014   B12 deficiency 10/19/2014   Fibrocystic breast disease 10/19/2014   GERD without esophagitis 10/19/2014   OP (osteoporosis) 10/19/2014   Ovarian failure 10/19/2014   Hiatal hernia 10/19/2014   Restless legs syndrome 10/19/2014   Dyslipidemia 08/23/2008   Hyperlipidemia 08/23/2008   Vitamin D  deficiency 10/10/2007    Past Surgical History:  Procedure Laterality Date   BREAST CYST ASPIRATION Left    BREAST EXCISIONAL BIOPSY Left 1980's   neg   CATARACT EXTRACTION     EYE SURGERY     left   KYPHOPLASTY N/A 08/19/2016   Procedure: KYPHOPLASTY L3,L4;  Surgeon: Kathlynn Sharper, MD;  Location: [facility];  Service: Orthopedics;  Laterality: N/A;   KYPHOPLASTY N/A 09/21/2016   Procedure: [id number];  Surgeon: Kathlynn Sharper, MD;  Location: [facility];  Service: Orthopedics;  Laterality: N/A;   MOUTH SURGERY     POLYPECTOMY     SLING BLADE PROCEDURE     TOTAL ABDOMINAL HYSTERECTOMY      Family History  Problem Relation Age of Onset   Transient ischemic attack Mother    Atrial fibrillation Mother    Arthritis Mother        OA   Heart disease Father        CHF   CAD Father    Hyperlipidemia Sister    Arthritis Sister        OA   Stroke Sister        Lived in a nursing  home and died from COVID-19   Arthritis Sister        OA   Drug abuse Son    Hypertension Son    Liver disease Son    Alcoholism Son    Breast cancer Maternal Aunt    Breast cancer Paternal Aunt    Heart failure Other    Coronary artery disease Other    Stroke Other    Hyperlipidemia Other    Hypertension Other    Thyroid  disease Other     Social History   Tobacco Use   Smoking status: Never   Smokeless tobacco: Never  Substance Use Topics   Alcohol use: No    Alcohol/week: 0.0 standard drinks of alcohol     Current Outpatient Medications:    aspirin  81 MG EC tablet, Take 1 tablet (81  mg total) by mouth daily. Swallow whole., Disp: 30 tablet, Rfl: 12   atorvastatin  (LIPITOR) 80 MG tablet, TAKE 1 TABLET(80 MG) BY MOUTH EVERY EVENING, Disp: 90 tablet, Rfl: 1   calcium  carbonate (TUMS EX) 750 MG chewable tablet, Chew 1 tablet by mouth daily., Disp: , Rfl:    Cholecalciferol  (VITAMIN D ) 2000 units CAPS, Take 1 capsule by mouth daily., Disp: , Rfl:    clopidogrel  (PLAVIX ) 75 MG tablet, Take 1 tablet (75 mg total) by mouth daily., Disp: 90 tablet, Rfl: 1   denosumab  (PROLIA ) 60 MG/ML SOSY injection, Inject 60 mg into the skin every 6 (six) months., Disp: , Rfl:    ezetimibe  (ZETIA ) 10 MG tablet, Take 1 tablet (10 mg total) by mouth daily., Disp: 90 tablet, Rfl: 3   fexofenadine  (ALLEGRA  ALLERGY) 180 MG tablet, Take 1 tablet (180 mg total) by mouth daily., Disp: 90 tablet, Rfl: 0   levothyroxine  (SYNTHROID ) 25 MCG tablet, TAKE 1 TO 2 TABLETS BY MOUTH EVERY DAY BEFORE BREAKFAST, ALTERNATE EVERY OTHER DAY, Disp: 135 tablet, Rfl: 1   Magnesium  400 MG TABS, Take 1 tablet by mouth daily., Disp: , Rfl:    pantoprazole  (PROTONIX ) 40 MG tablet, TAKE 1 TABLET BY MOUTH EVERY DAY, IN PLACE OF OMEPRAZOLE , Disp: 90 tablet, Rfl: 1   vitamin B-12 (CYANOCOBALAMIN ) 1000 MCG tablet, Take 1,000 mcg by mouth 3 (three) times a week., Disp: , Rfl:    Iron , Ferrous Sulfate , 325 (65 Fe) MG TABS, Take 325 mg by mouth every other day., Disp: 45 tablet, Rfl: 1  Allergies  Allergen Reactions   Alendronate     Dyspepsia, inflamed esophagus    Levofloxacin Hives   Nitrofurantoin Monohyd Macro Hives   Sulfamethoxazole-Trimethoprim Hives   Etodolac Hives   Penicillins Hives and Rash    Has patient had a PCN reaction causing immediate rash, facial/tongue/throat swelling, SOB or lightheadedness with hypotension: No Has patient had a PCN reaction causing severe rash involving mucus membranes or skin necrosis: No Has patient had a PCN reaction that required hospitalization: No Has patient had a PCN reaction  occurring within the last 10 years: No If all of the above answers are NO, then may proceed with Cephalosporin use.     I personally reviewed active problem list, medication list, allergies, family history with the patient/caregiver today.   ROS  Ten systems reviewed and is negative except as mentioned in HPI    Objective Physical Exam CONSTITUTIONAL: Patient appears well-developed and well-nourished. No distress. HEENT: Head atraumatic, normocephalic, neck supple. CARDIOVASCULAR: Normal rate, regular rhythm and normal heart sounds. No murmur heard. No edema. PULMONARY: Effort normal and breath sounds normal.  Lungs clear to auscultation. No respiratory distress. ABDOMINAL: There is no tenderness or distention. MUSCULOSKELETAL: Normal gait. Without gross motor or sensory deficit. PSYCHIATRIC: Patient has a normal mood and affect. Behavior is normal. Judgment and thought content normal.  Vitals:   12/23/23 0845  BP: 124/74  Pulse: 77  Resp: 16  SpO2: 95%  Weight: 104 lb 6.4 oz (47.4 kg)  Height: 5' 1 (1.549 m)    Body mass index is 19.73 kg/m.   PHQ2/9:    12/23/2023    8:37 AM 06/16/2023   11:40 AM 12/24/2022   10:53 AM 07/05/2022   11:02 AM 06/23/2022    8:57 AM  Depression screen PHQ 2/9  Decreased Interest 0 0 0 0 0  Down, Depressed, Hopeless 0 0 0 0 0  PHQ - 2 Score 0 0 0 0 0  Altered sleeping  0 0 0 0  Tired, decreased energy  0 0 0 0  Change in appetite  0 0 0 0  Feeling bad or failure about yourself   0 0 0 0  Trouble concentrating  0 0 0 0  Moving slowly or fidgety/restless  0 0 0 0  Suicidal thoughts  0 0 0 0  PHQ-9 Score  0 0 0 0  Difficult doing work/chores  Not difficult at all  Not difficult at all     phq 9 is negative  Fall Risk:    12/23/2023    8:37 AM 06/16/2023   11:43 AM 12/24/2022   10:53 AM 07/05/2022   11:02 AM 06/23/2022    8:57 AM  Fall Risk   Falls in the past year? 0 0 0 0 0  Number falls in past yr: 0 0 0 0 0  Injury with  Fall? 0 0 0 0 0  Risk for fall due to : No Fall Risks No Fall Risks No Fall Risks  No Fall Risks  Follow up Falls evaluation completed Falls prevention discussed;Falls evaluation completed Falls prevention discussed  Falls prevention discussed      Assessment & Plan Atherosclerosis of aorta with hyperlipidemia and history of cerebrovascular event Managed with atorvastatin , ezetimibe , and clopidogrel . No recent cerebrovascular or cardiac symptoms. No medication side effects reported. - Continue atorvastatin  and ezetimibe . - Continue clopidogrel .  Anemia of chronic disease Likely hereditary. Hemoglobin levels stable. No symptoms reported. - Monitor hemoglobin levels regularly.  Gastroesophageal reflux disease with hiatal hernia Managed with pantoprazole  and Tums. Intermittent gastric pain possibly related to diet and posture. - Continue pantoprazole  and Tums. - Add Pepcid at night. - Advise to avoid reclining after meals. - Consider referral to GI if symptoms persist or worsen.  Hypothyroidism Managed with alternating doses of levothyroxine . No symptoms reported. Issues with pharmacy regarding refills noted. - Continue alternating doses of levothyroxine . - Monitor thyroid  function tests. - Address prescription refill issues with pharmacy.  Age-related osteoporosis Managed with Prolia . Recent bone density showed slight improvement. No fractures reported. - Continue Prolia  injections. - Monitor bone density as scheduled.  General Health Maintenance Discussed immunizations and wellness visits. No adverse reactions to previous COVID vaccinations. Received flu shot today. - Advise to receive COVID vaccine two weeks after flu shot. - Schedule Medicare wellness visit when due.

## 2023-12-26 ENCOUNTER — Ambulatory Visit: Payer: Self-pay | Admitting: Family Medicine

## 2023-12-27 ENCOUNTER — Telehealth: Payer: Self-pay

## 2023-12-27 NOTE — Telephone Encounter (Signed)
 Called and lvm informing patient it is for GERD

## 2023-12-27 NOTE — Telephone Encounter (Signed)
 Copied from CRM [id number]. Topic: Clinical - Medication Question >> Dec 27, 2023 11:41 AM Nathanel BROCKS wrote: Reason for CRM:   famotidine (PEPCID) 40 MG tablet  Pt was not aware of being prescribed this medication at her last visit. Could you please call her to explain why.

## 2024-01-24 DIAGNOSIS — Z23 Encounter for immunization: Secondary | ICD-10-CM | POA: Diagnosis not present

## 2024-01-24 DIAGNOSIS — D3131 Benign neoplasm of right choroid: Secondary | ICD-10-CM | POA: Diagnosis not present

## 2024-01-24 DIAGNOSIS — H40003 Preglaucoma, unspecified, bilateral: Secondary | ICD-10-CM | POA: Diagnosis not present

## 2024-01-24 DIAGNOSIS — Z961 Presence of intraocular lens: Secondary | ICD-10-CM | POA: Diagnosis not present

## 2024-02-02 DIAGNOSIS — M810 Age-related osteoporosis without current pathological fracture: Secondary | ICD-10-CM | POA: Diagnosis not present

## 2024-02-23 DIAGNOSIS — H903 Sensorineural hearing loss, bilateral: Secondary | ICD-10-CM | POA: Diagnosis not present

## 2024-02-23 DIAGNOSIS — H6123 Impacted cerumen, bilateral: Secondary | ICD-10-CM | POA: Diagnosis not present

## 2024-03-12 ENCOUNTER — Other Ambulatory Visit: Payer: Self-pay | Admitting: Family Medicine

## 2024-03-12 DIAGNOSIS — Z1231 Encounter for screening mammogram for malignant neoplasm of breast: Secondary | ICD-10-CM

## 2024-04-06 ENCOUNTER — Ambulatory Visit
Admission: RE | Admit: 2024-04-06 | Discharge: 2024-04-06 | Disposition: A | Source: Ambulatory Visit | Attending: Family Medicine | Admitting: Family Medicine

## 2024-04-06 DIAGNOSIS — Z1231 Encounter for screening mammogram for malignant neoplasm of breast: Secondary | ICD-10-CM | POA: Insufficient documentation

## 2024-05-21 ENCOUNTER — Ambulatory Visit: Admitting: Dermatology

## 2024-06-21 ENCOUNTER — Ambulatory Visit

## 2024-06-22 ENCOUNTER — Ambulatory Visit: Admitting: Family Medicine
